# Patient Record
Sex: Female | Born: 1940
Health system: Southern US, Community
[De-identification: ages and names within clinical notes are randomized; demographics above are authoritative.]

## PROBLEM LIST (undated history)

## (undated) DIAGNOSIS — J45909 Unspecified asthma, uncomplicated: Secondary | ICD-10-CM

## (undated) DIAGNOSIS — F419 Anxiety disorder, unspecified: Secondary | ICD-10-CM

## (undated) DIAGNOSIS — E7800 Pure hypercholesterolemia, unspecified: Secondary | ICD-10-CM

## (undated) DIAGNOSIS — K635 Polyp of colon: Secondary | ICD-10-CM

## (undated) DIAGNOSIS — D242 Benign neoplasm of left breast: Secondary | ICD-10-CM

## (undated) DIAGNOSIS — I078 Other rheumatic tricuspid valve diseases: Secondary | ICD-10-CM

## (undated) DIAGNOSIS — Z9221 Personal history of antineoplastic chemotherapy: Secondary | ICD-10-CM

## (undated) HISTORY — PX: OTHER SURGICAL HISTORY: SHX169

## (undated) HISTORY — DX: Benign neoplasm of left breast: D24.2

## (undated) HISTORY — PX: TONSILLECTOMY: SUR1361

## (undated) HISTORY — DX: Unspecified asthma, uncomplicated: J45.909

## (undated) HISTORY — DX: Pure hypercholesterolemia, unspecified: E78.00

## (undated) HISTORY — PX: FOOT SURGERY: SHX648

## (undated) HISTORY — PX: CYST REMOVAL TRUNK: SHX6283

## (undated) HISTORY — DX: Polyp of colon: K63.5

## (undated) HISTORY — PX: EYE SURGERY: SHX253

---

## 2003-09-13 ENCOUNTER — Emergency Department (HOSPITAL_COMMUNITY): Admission: EM | Admit: 2003-09-13 | Discharge: 2003-09-13 | Payer: Self-pay | Admitting: Emergency Medicine

## 2004-07-16 ENCOUNTER — Ambulatory Visit (HOSPITAL_COMMUNITY): Admission: RE | Admit: 2004-07-16 | Discharge: 2004-07-16 | Payer: Self-pay | Admitting: General Surgery

## 2004-07-16 ENCOUNTER — Ambulatory Visit (HOSPITAL_BASED_OUTPATIENT_CLINIC_OR_DEPARTMENT_OTHER): Admission: RE | Admit: 2004-07-16 | Discharge: 2004-07-16 | Payer: Self-pay | Admitting: General Surgery

## 2004-07-16 ENCOUNTER — Encounter (INDEPENDENT_AMBULATORY_CARE_PROVIDER_SITE_OTHER): Payer: Self-pay | Admitting: *Deleted

## 2006-02-25 ENCOUNTER — Encounter: Admission: RE | Admit: 2006-02-25 | Discharge: 2006-02-25 | Payer: Self-pay | Admitting: Family Medicine

## 2007-09-04 ENCOUNTER — Other Ambulatory Visit: Admission: RE | Admit: 2007-09-04 | Discharge: 2007-09-04 | Payer: Self-pay | Admitting: Obstetrics and Gynecology

## 2008-05-03 ENCOUNTER — Ambulatory Visit (HOSPITAL_BASED_OUTPATIENT_CLINIC_OR_DEPARTMENT_OTHER): Admission: RE | Admit: 2008-05-03 | Discharge: 2008-05-03 | Payer: Self-pay | Admitting: General Surgery

## 2008-05-03 ENCOUNTER — Encounter (INDEPENDENT_AMBULATORY_CARE_PROVIDER_SITE_OTHER): Payer: Self-pay | Admitting: General Surgery

## 2008-05-13 ENCOUNTER — Ambulatory Visit: Payer: Self-pay | Admitting: Oncology

## 2008-05-20 LAB — CBC WITH DIFFERENTIAL (CANCER CENTER ONLY)
BASO#: 0 10*3/uL (ref 0.0–0.2)
BASO%: 0.7 % (ref 0.0–2.0)
EOS%: 1.8 % (ref 0.0–7.0)
Eosinophils Absolute: 0.1 10*3/uL (ref 0.0–0.5)
HCT: 38 % (ref 34.8–46.6)
HGB: 12.7 g/dL (ref 11.6–15.9)
LYMPH#: 2.3 10*3/uL (ref 0.9–3.3)
LYMPH%: 39 % (ref 14.0–48.0)
MCH: 26.8 pg (ref 26.0–34.0)
MCHC: 33.4 g/dL (ref 32.0–36.0)
MCV: 80 fL — ABNORMAL LOW (ref 81–101)
MONO#: 0.4 10*3/uL (ref 0.1–0.9)
MONO%: 6.4 % (ref 0.0–13.0)
NEUT#: 3.1 10*3/uL (ref 1.5–6.5)
NEUT%: 52.1 % (ref 39.6–80.0)
Platelets: 231 10*3/uL (ref 145–400)
RBC: 4.74 10*6/uL (ref 3.70–5.32)
RDW: 12.3 % (ref 10.5–14.6)
WBC: 6 10*3/uL (ref 3.9–10.0)

## 2008-05-20 LAB — CMP (CANCER CENTER ONLY)
ALT(SGPT): 17 U/L (ref 10–47)
AST: 26 U/L (ref 11–38)
Albumin: 3.8 g/dL (ref 3.3–5.5)
Alkaline Phosphatase: 73 U/L (ref 26–84)
BUN, Bld: 15 mg/dL (ref 7–22)
CO2: 27 mEq/L (ref 18–33)
Calcium: 9.2 mg/dL (ref 8.0–10.3)
Chloride: 104 mEq/L (ref 98–108)
Creat: 0.9 mg/dl (ref 0.6–1.2)
Glucose, Bld: 101 mg/dL (ref 73–118)
Potassium: 4 mEq/L (ref 3.3–4.7)
Sodium: 142 mEq/L (ref 128–145)
Total Bilirubin: 0.6 mg/dl (ref 0.20–1.60)
Total Protein: 7.2 g/dL (ref 6.4–8.1)

## 2008-07-04 ENCOUNTER — Ambulatory Visit: Payer: Self-pay | Admitting: Oncology

## 2008-07-05 LAB — CMP (CANCER CENTER ONLY)
ALT(SGPT): 19 U/L (ref 10–47)
AST: 23 U/L (ref 11–38)
Albumin: 3.5 g/dL (ref 3.3–5.5)
Alkaline Phosphatase: 70 U/L (ref 26–84)
BUN, Bld: 16 mg/dL (ref 7–22)
CO2: 27 mEq/L (ref 18–33)
Calcium: 9.5 mg/dL (ref 8.0–10.3)
Chloride: 107 mEq/L (ref 98–108)
Creat: 0.8 mg/dl (ref 0.6–1.2)
Glucose, Bld: 123 mg/dL — ABNORMAL HIGH (ref 73–118)
Potassium: 4 mEq/L (ref 3.3–4.7)
Sodium: 143 mEq/L (ref 128–145)
Total Bilirubin: 0.5 mg/dl (ref 0.20–1.60)
Total Protein: 6.8 g/dL (ref 6.4–8.1)

## 2008-07-05 LAB — CBC WITH DIFFERENTIAL (CANCER CENTER ONLY)
BASO#: 0 10*3/uL (ref 0.0–0.2)
BASO%: 0.8 % (ref 0.0–2.0)
EOS%: 1.9 % (ref 0.0–7.0)
Eosinophils Absolute: 0.1 10*3/uL (ref 0.0–0.5)
HCT: 36.4 % (ref 34.8–46.6)
HGB: 12 g/dL (ref 11.6–15.9)
LYMPH#: 1.9 10*3/uL (ref 0.9–3.3)
LYMPH%: 38.6 % (ref 14.0–48.0)
MCH: 26.6 pg (ref 26.0–34.0)
MCHC: 33 g/dL (ref 32.0–36.0)
MCV: 80 fL — ABNORMAL LOW (ref 81–101)
MONO#: 0.4 10*3/uL (ref 0.1–0.9)
MONO%: 8.7 % (ref 0.0–13.0)
NEUT#: 2.5 10*3/uL (ref 1.5–6.5)
NEUT%: 50 % (ref 39.6–80.0)
Platelets: 195 10*3/uL (ref 145–400)
RBC: 4.53 10*6/uL (ref 3.70–5.32)
RDW: 12 % (ref 10.5–14.6)
WBC: 5 10*3/uL (ref 3.9–10.0)

## 2009-01-21 ENCOUNTER — Ambulatory Visit: Payer: Self-pay | Admitting: Oncology

## 2009-06-30 ENCOUNTER — Ambulatory Visit: Payer: Self-pay | Admitting: Oncology

## 2009-07-03 LAB — CBC WITH DIFFERENTIAL (CANCER CENTER ONLY)
BASO#: 0 10*3/uL (ref 0.0–0.2)
BASO%: 0.6 % (ref 0.0–2.0)
EOS%: 2.9 % (ref 0.0–7.0)
Eosinophils Absolute: 0.2 10*3/uL (ref 0.0–0.5)
HCT: 39.5 % (ref 34.8–46.6)
HGB: 12.6 g/dL (ref 11.6–15.9)
LYMPH#: 2.6 10*3/uL (ref 0.9–3.3)
LYMPH%: 46.3 % (ref 14.0–48.0)
MCH: 26.8 pg (ref 26.0–34.0)
MCHC: 32 g/dL (ref 32.0–36.0)
MCV: 84 fL (ref 81–101)
MONO#: 0.4 10*3/uL (ref 0.1–0.9)
MONO%: 7.4 % (ref 0.0–13.0)
NEUT#: 2.4 10*3/uL (ref 1.5–6.5)
NEUT%: 42.8 % (ref 39.6–80.0)
Platelets: 199 10*3/uL (ref 145–400)
RBC: 4.72 10*6/uL (ref 3.70–5.32)
RDW: 12.7 % (ref 10.5–14.6)
WBC: 5.6 10*3/uL (ref 3.9–10.0)

## 2009-07-03 LAB — CMP (CANCER CENTER ONLY)
ALT(SGPT): 42 U/L (ref 10–47)
AST: 37 U/L (ref 11–38)
Albumin: 3.5 g/dL (ref 3.3–5.5)
Alkaline Phosphatase: 92 U/L — ABNORMAL HIGH (ref 26–84)
BUN, Bld: 16 mg/dL (ref 7–22)
CO2: 31 mEq/L (ref 18–33)
Calcium: 9.5 mg/dL (ref 8.0–10.3)
Chloride: 104 mEq/L (ref 98–108)
Creat: 0.7 mg/dl (ref 0.6–1.2)
Glucose, Bld: 119 mg/dL — ABNORMAL HIGH (ref 73–118)
Potassium: 3.7 mEq/L (ref 3.3–4.7)
Sodium: 144 mEq/L (ref 128–145)
Total Bilirubin: 0.5 mg/dl (ref 0.20–1.60)
Total Protein: 7 g/dL (ref 6.4–8.1)

## 2009-07-03 LAB — TECHNOLOGIST REVIEW CHCC SATELLITE

## 2010-05-07 ENCOUNTER — Encounter: Admission: RE | Admit: 2010-05-07 | Discharge: 2010-05-07 | Payer: Self-pay | Admitting: Radiology

## 2010-08-28 ENCOUNTER — Other Ambulatory Visit: Payer: Self-pay | Admitting: Obstetrics and Gynecology

## 2010-08-28 ENCOUNTER — Other Ambulatory Visit (HOSPITAL_COMMUNITY)
Admission: RE | Admit: 2010-08-28 | Discharge: 2010-08-28 | Disposition: A | Discharge status: Home / Self Care | Payer: Medicare Other | Source: Ambulatory Visit | Attending: Obstetrics and Gynecology | Admitting: Obstetrics and Gynecology

## 2010-08-28 DIAGNOSIS — Z01419 Encounter for gynecological examination (general) (routine) without abnormal findings: Secondary | ICD-10-CM | POA: Insufficient documentation

## 2010-12-01 NOTE — Op Note (Signed)
NAMEARMONI, Claudia Ortiz              ACCOUNT NO.:  [id number]   MEDICAL RECORD NO.:  [id number]          PATIENT TYPE:  AMB   LOCATION:  DSC                          FACILITY:  [facility]   PHYSICIAN:  Juanetta Gosling, MDDATE OF BIRTH:  January 19, 1941   DATE OF PROCEDURE:  05/03/2008  DATE OF DISCHARGE:                               OPERATIVE REPORT   PREOPERATIVE DIAGNOSIS:  Right breast bloody nipple discharge.   POSTOPERATIVE DIAGNOSIS:  Right breast bloody nipple discharge.   PROCEDURE:  Right breast central duct excision.   SURGEON:  Juanetta Gosling, MD   ASSISTANT:  None.   ANESTHESIA:  General.   SPECIMENS:  Right breast mass sent to pathology.   ESTIMATED BLOOD LOSS:  Minimal.   COMPLICATIONS:  None.   DISPOSITION:  To PACU in stable condition.   HISTORY:  Claudia Ortiz is a 70 year old female with a prior left breast  ductal excision for an intraductal papilloma by Dr. Maple Hudson in 2005 and  bilateral breast biopsies, who has had clear discharge since the end of  2008 that had turned bloody recently.  She has a spontaneous discharge  she finds on her undergarments with no associated mass she can identify.  She underwent a mammogram, which was BI-RADS who underwent a right  breast ultrasound that showed 2 masses in the 9 o'clock position and  another in the 3 o'clock position.  On her exam in the right upper outer  quadrant, she did have some bloody nipple discharge.  I counseled her  for a central duct excision for a bloody nipple discharge.   PROCEDURE:  After informed consent was obtained, the patient was taken  to the operating room.  I was able to express some clear discharge from  this area, but no bloody discharge.  There was clear discharge coming  from numerous ducts on her nipple.  She was placed under general  anesthesia without complication with an LMA.  Her right breast was then  prepped and draped in the standard sterile surgical fashion with  chlorhexidine.  A surgical time-out was performed.  I made approximately  3 cm incision on the areolar border laterally on her right breast and  then I dissected down below the nipple.  There were 2 palpable masses in  the region where the duct that had previously been draining bloody  discharge were.  I removed this specimen in total.  She had several  other areas that felt abnormal and I proceeded with a central duct  excision with removal of all abnormal tissue.  She still had some a  little bit of clear discharge upon completion from another duct that I  also removed in the 3 o'clock position.  Hemostasis was observed.  I  closed the deep layers with a 3-0 Vicryl to help her breasts retained in  shape and then I closed the skin with a 4-0 Vicryl in the dermis and a 4-  0 Monocryl in a subcuticular fashion.  I then placed a Steri-  Strips and a sterile dressing over the wound.  I infiltrated this with  10  mL of 0.25% Marcaine plain at the completion of this.  She tolerated  this well.  She was extubated in the operating room and transferred to  the recovery room in stable condition.      Juanetta Gosling, MD  Electronically Signed     MCW/MEDQ  D:  05/03/2008  T:  05/04/2008  Job:  [id number]

## 2011-01-13 ENCOUNTER — Other Ambulatory Visit: Payer: Self-pay | Admitting: Oncology

## 2011-01-13 DIAGNOSIS — N6452 Nipple discharge: Secondary | ICD-10-CM

## 2011-04-19 LAB — DIFFERENTIAL
Basophils Absolute: 0.1
Basophils Relative: 1
Eosinophils Absolute: 0.1
Eosinophils Relative: 2
Lymphocytes Relative: 39
Lymphs Abs: 2.1
Monocytes Absolute: 0.6
Monocytes Relative: 11
Neutro Abs: 2.5
Neutrophils Relative %: 47

## 2011-04-19 LAB — COMPREHENSIVE METABOLIC PANEL
ALT: 21
AST: 27
Albumin: 3.7
Alkaline Phosphatase: 63
BUN: 12
CO2: 27
Calcium: 9.6
Chloride: 107
Creatinine, Ser: 0.75
GFR calc Af Amer: >60
GFR calc non Af Amer: >60
Glucose, Bld: 96
Potassium: 4
Sodium: 140
Total Bilirubin: 0.5
Total Protein: 6.8

## 2011-04-19 LAB — CBC
HCT: 39.6
Hemoglobin: 12.8
MCHC: 32.3
MCV: 84.5
Platelets: 220
RBC: 4.68
RDW: 14
WBC: 5.3

## 2011-05-18 ENCOUNTER — Other Ambulatory Visit: Payer: Self-pay | Admitting: Radiology

## 2011-06-14 ENCOUNTER — Encounter (INDEPENDENT_AMBULATORY_CARE_PROVIDER_SITE_OTHER): Payer: Self-pay | Admitting: General Surgery

## 2012-09-04 ENCOUNTER — Other Ambulatory Visit (HOSPITAL_COMMUNITY)
Admission: RE | Admit: 2012-09-04 | Discharge: 2012-09-04 | Disposition: A | Discharge status: Home / Self Care | Payer: Medicare Other | Source: Ambulatory Visit | Attending: Obstetrics and Gynecology | Admitting: Obstetrics and Gynecology

## 2012-09-04 ENCOUNTER — Other Ambulatory Visit: Payer: Self-pay | Admitting: Obstetrics and Gynecology

## 2012-09-04 DIAGNOSIS — Z1151 Encounter for screening for human papillomavirus (HPV): Secondary | ICD-10-CM | POA: Insufficient documentation

## 2012-09-04 DIAGNOSIS — Z124 Encounter for screening for malignant neoplasm of cervix: Secondary | ICD-10-CM | POA: Insufficient documentation

## 2014-01-03 ENCOUNTER — Other Ambulatory Visit: Payer: Self-pay | Admitting: Family Medicine

## 2014-01-03 ENCOUNTER — Ambulatory Visit
Admission: RE | Admit: 2014-01-03 | Discharge: 2014-01-03 | Disposition: A | Discharge status: Home / Self Care | Payer: Medicare Other | Source: Ambulatory Visit | Attending: Family Medicine | Admitting: Family Medicine

## 2014-01-03 DIAGNOSIS — R0600 Dyspnea, unspecified: Secondary | ICD-10-CM

## 2014-02-18 ENCOUNTER — Encounter: Payer: Self-pay | Admitting: Interventional Cardiology

## 2014-02-18 ENCOUNTER — Encounter: Payer: Self-pay | Admitting: General Surgery

## 2014-02-18 ENCOUNTER — Ambulatory Visit (INDEPENDENT_AMBULATORY_CARE_PROVIDER_SITE_OTHER): Payer: Medicare Other | Admitting: Interventional Cardiology

## 2014-02-18 VITALS — BP 161/91 | HR 80 | Ht 65.5 in | Wt 205.8 lb

## 2014-02-18 DIAGNOSIS — Z853 Personal history of malignant neoplasm of breast: Secondary | ICD-10-CM

## 2014-02-18 DIAGNOSIS — R0600 Dyspnea, unspecified: Secondary | ICD-10-CM

## 2014-02-18 DIAGNOSIS — R0989 Other specified symptoms and signs involving the circulatory and respiratory systems: Secondary | ICD-10-CM

## 2014-02-18 DIAGNOSIS — R9431 Abnormal electrocardiogram [ECG] [EKG]: Secondary | ICD-10-CM

## 2014-02-18 DIAGNOSIS — R0609 Other forms of dyspnea: Secondary | ICD-10-CM

## 2014-02-18 DIAGNOSIS — R0789 Other chest pain: Secondary | ICD-10-CM

## 2014-02-18 DIAGNOSIS — E785 Hyperlipidemia, unspecified: Secondary | ICD-10-CM

## 2014-02-18 DIAGNOSIS — R5383 Other fatigue: Secondary | ICD-10-CM | POA: Insufficient documentation

## 2014-02-18 NOTE — Patient Instructions (Signed)
Your physician recommends that you continue on your current medications as directed. Please refer to the Current Medication list given to you today.  Your physician has requested that you have an exercise tolerance test. For further information please visit [email or web address]. Please also follow instruction sheet, as given.  Your physician recommends that you schedule a follow-up appointment Pending results of Exercise Tolerance Test.

## 2014-02-18 NOTE — Progress Notes (Signed)
Patient ID: TUYET BADER, female   DOB: 1940-09-28, 73 y.o.   MRN: [id number]   Date: 02/18/2014 ID: KAYSEE HERGERT, DOB 1941/01/03, MRN [id number] PCP: Osborne Casco, MD  Reason: Chest discomfort  ASSESSMENT;  1.  Atypical chest discomfort beginning 6 weeks ago and now essentially resolved 2. Dyspnea on exertion 3. History of breast cancer 4. History of asthma 5. Hyperlipidemia    PLAN:  1. Exercise treadmill test   SUBJECTIVE: YASSMINE TAMM is a 73 y.o. female who is referred for evaluation because of a several week history of cough, chest tightness, and dyspnea. Since she initially complained, her symptoms have improved. There is no orthopnea or PND. She denies palpitations. No lower extremity swelling has been noted. The patient has a significant family history of coronary disease. Both her mother and father died of vascular disease.   Allergies  Allergen Reactions  . Cephalosporins   . Iodine   . Penicillins     No current outpatient prescriptions on file prior to visit.   No current facility-administered medications on file prior to visit.    Past Medical History  Diagnosis Date  . Colon polyp   . Asthma     As Child  . Intraductal papilloma of left breast      precancerous cells in R breast  . Elevated cholesterol     Past Surgical History  Procedure Laterality Date  . Cyst removal trunk      breast   . Foot surgery      History   Social History  . Marital Status: Married    Spouse Name: N/A    Number of Children: N/A  . Years of Education: N/A   Occupational History  . Not on file.   Social History Main Topics  . Smoking status: Never Smoker   . Smokeless tobacco: Not on file  . Alcohol Use: No  . Drug Use: No  . Sexual Activity: Not on file   Other Topics Concern  . Not on file   Social History Narrative  . No narrative on file    Family History  Problem Relation Age of Onset  . Diabetes Mother   . Heart attack  Mother   . Stroke Father   . Heart disease Father   . Heart attack Brother     ROS: Denies orthopnea, PND, claudication, palpitations, syncope, stroke, previous myocardial infarction, diabetes, and hypertension to. Other systems negative for complaints.  OBJECTIVE: BP 161/91  Pulse 80  Ht 5' 5.5" (1.664 m)  Wt 205 lb 12.8 oz (93.35 kg)  BMI 33.71 kg/m2,  General: No acute distress, mildly obese HEENT: normal no jaundice or pallor Neck: JVD flat. Carotids absent bruits and normal upstroke Chest: Clear Cardiac: Murmur: Absent. Gallop: Normal. Rhythm: Normal. Other: None Abdomen: Bruit: Absent. Pulsation: Absent Extremities: Edema: None. Pulses: 2+ and symmetric Neuro: Normal Psych: Normal  ECG: Poor R-wave progression V1 through V4 with leftward axis

## 2014-02-22 ENCOUNTER — Ambulatory Visit (HOSPITAL_COMMUNITY)
Admission: RE | Admit: 2014-02-22 | Discharge: 2014-02-22 | Disposition: A | Discharge status: Home / Self Care | Payer: Medicare Other | Source: Ambulatory Visit | Attending: Interventional Cardiology | Admitting: Interventional Cardiology

## 2014-02-22 DIAGNOSIS — R5381 Other malaise: Secondary | ICD-10-CM | POA: Diagnosis not present

## 2014-02-22 DIAGNOSIS — R0609 Other forms of dyspnea: Secondary | ICD-10-CM | POA: Insufficient documentation

## 2014-02-22 DIAGNOSIS — R5383 Other fatigue: Secondary | ICD-10-CM

## 2014-02-22 DIAGNOSIS — R0989 Other specified symptoms and signs involving the circulatory and respiratory systems: Secondary | ICD-10-CM | POA: Insufficient documentation

## 2014-02-22 DIAGNOSIS — R0789 Other chest pain: Secondary | ICD-10-CM | POA: Diagnosis not present

## 2014-02-22 DIAGNOSIS — R9431 Abnormal electrocardiogram [ECG] [EKG]: Secondary | ICD-10-CM | POA: Insufficient documentation

## 2015-09-30 ENCOUNTER — Other Ambulatory Visit: Payer: Self-pay | Admitting: Family Medicine

## 2015-09-30 ENCOUNTER — Ambulatory Visit
Admission: RE | Admit: 2015-09-30 | Discharge: 2015-09-30 | Disposition: A | Discharge status: Home / Self Care | Payer: Medicare Other | Source: Ambulatory Visit | Attending: Family Medicine | Admitting: Family Medicine

## 2015-09-30 DIAGNOSIS — R05 Cough: Secondary | ICD-10-CM

## 2015-09-30 DIAGNOSIS — R059 Cough, unspecified: Secondary | ICD-10-CM

## 2017-07-19 DIAGNOSIS — M1990 Unspecified osteoarthritis, unspecified site: Secondary | ICD-10-CM

## 2017-07-19 HISTORY — DX: Unspecified osteoarthritis, unspecified site: M19.90

## 2019-01-12 ENCOUNTER — Other Ambulatory Visit: Payer: Self-pay | Admitting: Family Medicine

## 2019-01-12 ENCOUNTER — Ambulatory Visit
Admission: RE | Admit: 2019-01-12 | Discharge: 2019-01-12 | Disposition: A | Discharge status: Home / Self Care | Payer: Medicare Other | Source: Ambulatory Visit | Attending: Family Medicine | Admitting: Family Medicine

## 2019-01-12 DIAGNOSIS — R52 Pain, unspecified: Secondary | ICD-10-CM

## 2019-09-13 ENCOUNTER — Ambulatory Visit: Payer: Medicare Other | Attending: Internal Medicine

## 2019-09-13 DIAGNOSIS — Z23 Encounter for immunization: Secondary | ICD-10-CM

## 2019-09-13 NOTE — Progress Notes (Signed)
   Covid-19 Vaccination Clinic  Name:  Claudia Ortiz    MRN: [id number] DOB: 04-20-1941  09/13/2019  [name] was observed post Covid-19 immunization for 15 minutes without incidence. She was provided with Vaccine Information Sheet and instruction to access the V-Safe system.   [name] was instructed to call 911 with any severe reactions post vaccine: Marland Kitchen Difficulty breathing  . Swelling of your face and throat  . A fast heartbeat  . A bad rash all over your body  . Dizziness and weakness    Immunizations Administered    Name Date Dose VIS Date Route   Pfizer COVID-19 Vaccine 09/13/2019 10:02 AM 0.3 mL 06/29/2019 Intramuscular   Manufacturer: [place]   Lot: [id number]   [place]: [id number]

## 2019-10-10 ENCOUNTER — Ambulatory Visit: Payer: Medicare Other | Attending: Internal Medicine

## 2019-10-10 DIAGNOSIS — Z23 Encounter for immunization: Secondary | ICD-10-CM

## 2019-10-10 NOTE — Progress Notes (Signed)
   Covid-19 Vaccination Clinic  Name:  Claudia Ortiz    MRN: [id number] DOB: 02/02/1941  10/10/2019  [name] was observed post Covid-19 immunization for 15 minutes without incident. She was provided with Vaccine Information Sheet and instruction to access the V-Safe system.   [name] was instructed to call 911 with any severe reactions post vaccine: Marland Kitchen Difficulty breathing  . Swelling of face and throat  . A fast heartbeat  . A bad rash all over body  . Dizziness and weakness   Immunizations Administered    Name Date Dose VIS Date Route   Pfizer COVID-19 Vaccine 10/10/2019 10:58 AM 0.3 mL 06/29/2019 Intramuscular   Manufacturer: [place]   Lot: [id number]   [place]: [id number]

## 2020-05-19 DIAGNOSIS — C801 Malignant (primary) neoplasm, unspecified: Secondary | ICD-10-CM

## 2020-05-19 HISTORY — DX: Malignant (primary) neoplasm, unspecified: C80.1

## 2020-07-08 ENCOUNTER — Other Ambulatory Visit: Payer: Self-pay | Admitting: Radiology

## 2020-07-09 ENCOUNTER — Encounter: Payer: Self-pay | Admitting: *Deleted

## 2020-07-10 ENCOUNTER — Encounter: Payer: Self-pay | Admitting: Family Medicine

## 2020-07-14 ENCOUNTER — Other Ambulatory Visit: Payer: Self-pay | Admitting: *Deleted

## 2020-07-14 DIAGNOSIS — C50511 Malignant neoplasm of lower-outer quadrant of right female breast: Secondary | ICD-10-CM

## 2020-07-14 DIAGNOSIS — Z170 Estrogen receptor positive status [ER+]: Secondary | ICD-10-CM

## 2020-07-23 ENCOUNTER — Ambulatory Visit
Admission: RE | Admit: 2020-07-23 | Discharge: 2020-07-23 | Disposition: A | Discharge status: Home / Self Care | Payer: Medicare Other | Source: Ambulatory Visit | Attending: Radiation Oncology | Admitting: Radiation Oncology

## 2020-07-23 ENCOUNTER — Other Ambulatory Visit: Payer: Self-pay | Admitting: *Deleted

## 2020-07-23 ENCOUNTER — Inpatient Hospital Stay (HOSPITAL_BASED_OUTPATIENT_CLINIC_OR_DEPARTMENT_OTHER): Payer: Medicare Other | Admitting: Hematology and Oncology

## 2020-07-23 ENCOUNTER — Encounter: Payer: Self-pay | Admitting: Physical Therapy

## 2020-07-23 ENCOUNTER — Encounter: Payer: Self-pay | Admitting: *Deleted

## 2020-07-23 ENCOUNTER — Other Ambulatory Visit: Payer: Self-pay

## 2020-07-23 ENCOUNTER — Inpatient Hospital Stay: Payer: Medicare Other | Attending: Hematology and Oncology

## 2020-07-23 ENCOUNTER — Ambulatory Visit: Payer: Medicare Other | Attending: General Surgery | Admitting: Physical Therapy

## 2020-07-23 DIAGNOSIS — Z833 Family history of diabetes mellitus: Secondary | ICD-10-CM | POA: Diagnosis not present

## 2020-07-23 DIAGNOSIS — Z170 Estrogen receptor positive status [ER+]: Secondary | ICD-10-CM

## 2020-07-23 DIAGNOSIS — C50511 Malignant neoplasm of lower-outer quadrant of right female breast: Secondary | ICD-10-CM | POA: Insufficient documentation

## 2020-07-23 DIAGNOSIS — Z8249 Family history of ischemic heart disease and other diseases of the circulatory system: Secondary | ICD-10-CM | POA: Diagnosis not present

## 2020-07-23 DIAGNOSIS — Z823 Family history of stroke: Secondary | ICD-10-CM | POA: Diagnosis not present

## 2020-07-23 DIAGNOSIS — R293 Abnormal posture: Secondary | ICD-10-CM | POA: Diagnosis not present

## 2020-07-23 LAB — CMP (CANCER CENTER ONLY)
ALT: 18 U/L (ref 0–44)
AST: 20 U/L (ref 15–41)
Albumin: 3.7 g/dL (ref 3.5–5.0)
Alkaline Phosphatase: 82 U/L (ref 38–126)
Anion gap: 8 (ref 5–15)
BUN: 13 mg/dL (ref 8–23)
CO2: 25 mmol/L (ref 22–32)
Calcium: 9.7 mg/dL (ref 8.9–10.3)
Chloride: 107 mmol/L (ref 98–111)
Creatinine: 0.89 mg/dL (ref 0.44–1.00)
GFR, Estimated: >60 mL/min (ref >60)
Glucose, Bld: 113 mg/dL — ABNORMAL HIGH (ref 70–99)
Potassium: 4 mmol/L (ref 3.5–5.1)
Sodium: 140 mmol/L (ref 135–145)
Total Bilirubin: 0.6 mg/dL (ref 0.3–1.2)
Total Protein: 7.2 g/dL (ref 6.5–8.1)

## 2020-07-23 LAB — CBC WITH DIFFERENTIAL (CANCER CENTER ONLY)
Abs Immature Granulocytes: 0.02 10*3/uL (ref 0.00–0.07)
Basophils Absolute: 0.1 10*3/uL (ref 0.0–0.1)
Basophils Relative: 1 %
Eosinophils Absolute: 0.1 10*3/uL (ref 0.0–0.5)
Eosinophils Relative: 1 %
HCT: 41.4 % (ref 36.0–46.0)
Hemoglobin: 13.2 g/dL (ref 12.0–15.0)
Immature Granulocytes: 0 %
Lymphocytes Relative: 41 %
Lymphs Abs: 2.7 10*3/uL (ref 0.7–4.0)
MCH: 26.7 pg (ref 26.0–34.0)
MCHC: 31.9 g/dL (ref 30.0–36.0)
MCV: 83.8 fL (ref 80.0–100.0)
Monocytes Absolute: 0.5 10*3/uL (ref 0.1–1.0)
Monocytes Relative: 8 %
Neutro Abs: 3.2 10*3/uL (ref 1.7–7.7)
Neutrophils Relative %: 49 %
Platelet Count: 214 10*3/uL (ref 150–400)
RBC: 4.94 MIL/uL (ref 3.87–5.11)
RDW: 14 % (ref 11.5–15.5)
WBC Count: 6.6 10*3/uL (ref 4.0–10.5)
nRBC: 0 % (ref 0.0–0.2)

## 2020-07-23 LAB — GENETIC SCREENING ORDER

## 2020-07-23 NOTE — Assessment & Plan Note (Signed)
07/14/2020:Screening mammogram showed a 1.5cm right breast mass. US showed the 1.4cm mass at the 7 o'clock position, a 2.7cm mass at the 9 o'clock position, and benign right axillary lymph nodes. Biopsy showed ALH at the 9 o'clock position, and at the 7 o'clock position, IDC, grade 3, HER-2 positive (3+), ER+ 20% weak, PR- 0%, Ki67 40%.  Pathology and radiology counseling: Discussed with the patient, the details of pathology including the type of breast cancer,the clinical staging, the significance of ER, PR and HER-2/neu receptors and the implications for treatment. After reviewing the pathology in detail, we proceeded to discuss the different treatment options between surgery, radiation, chemotherapy, antiestrogen therapies.  Recommendation based on multidisciplinary tumor board: 1. Neoadjuvant chemotherapy with Taxol Herceptin followed by Herceptin maintenance for 1 year 2. Followed by breast conserving surgery if possible with sentinel lymph node study 3. Followed by adjuvant radiation therapy if patient had lumpectomy 4.  Followed by adjuvant antiestrogen therapy  Chemotherapy Counseling: I discussed the risks and benefits of chemotherapy including the risks of nausea/ vomiting, risk of infection from low WBC count, fatigue due to chemo or anemia, bruising or bleeding due to low platelets, mouth sores, loss/ change in taste and decreased appetite. Liver and kidney function will be monitored through out chemotherapy as abnormalities in liver and kidney function may be a side effect of treatment. Cardiac dysfunction due to Herceptin was discussed in detail. Risk of permanent bone marrow dysfunction due to chemo were also discussed.  Plan: 1. Port placement 2. Echocardiogram 3. Chemotherapy class 4. Breast MRI  Return to clinic in 1-2 weeks to start chemotherapy.

## 2020-07-23 NOTE — Therapy (Signed)
[place], [place], [address] Phone: [phone]   Fax:  [phone]  Physical Therapy Evaluation  Patient Details  Name: Claudia Ortiz MRN: [id number] Date of Birth: 08-26-1940 Referring Provider (PT): Dr. Rolm Bookbinder   Encounter Date: 07/23/2020   PT End of Session - 07/23/20 1548    Visit Number 1    Number of Visits 2    Date for PT Re-Evaluation 09/17/20    PT Start Time 7915    PT Stop Time 1454    PT Time Calculation (min) 42 min    Activity Tolerance Patient tolerated treatment well    Behavior During Therapy [facility] for tasks assessed/performed           Past Medical History:  Diagnosis Date  . Asthma    As Child  . Colon polyp   . Elevated cholesterol   . Intraductal papilloma of left breast     precancerous cells in R breast    Past Surgical History:  Procedure Laterality Date  . CYST REMOVAL TRUNK     breast   . FOOT SURGERY      There were no vitals filed for this visit.    Subjective Assessment - 07/23/20 1549    Subjective Patient reports she is here today to be seen by her medical team for her newly diagnosed right breast cancer.    Pertinent History Patient was diagnosed on 06/17/2020 with right grade III invasive ductal carcinoma breast cancer. It measures 1.5 cm in the lower outer quadrant. It is ER positive, PR negative and HER2 positive with a Ki67 of 40%.    Patient Stated Goals Reduce lymphedema risk and learn post op shoulder ROM HEP    Currently in Pain? Yes    Pain Score 3     Pain Location Arm    Pain Orientation Left;Upper    Pain Descriptors / Indicators Aching    Pain Type Chronic pain    Pain Onset 1 to 4 weeks ago    Pain Frequency Intermittent    Aggravating Factors  Pain began after Shingles shot 03/2020    Pain Relieving Factors Unknown              [facility] PT Assessment - 07/23/20 0001      Assessment   Medical Diagnosis Right breast  cancer    Referring Provider (PT) Dr. Rolm Bookbinder    Onset Date/Surgical Date 06/17/20    Hand Dominance Right    Prior Therapy none      Precautions   Precautions Other (comment)    Precaution Comments active cancer      Restrictions   Weight Bearing Restrictions No      Balance Screen   Has the patient fallen in the past 6 months No    Has the patient had a decrease in activity level because of a fear of falling?  No    Is the patient reluctant to leave their home because of a fear of falling?  No      Home Environment   Living Environment Private residence    Living Arrangements Spouse/significant other    Available Help at Discharge Family      Prior Function   Level of [place] Retired    Leisure She walk 2x/week for 25 minutes      Cognition   Overall Cognitive Status Within Functional Limits for tasks assessed  Posture/Postural Control   Posture/Postural Control Postural limitations    Postural Limitations Rounded Shoulders;Forward head      ROM / Strength   AROM / PROM / Strength AROM;Strength      AROM   Overall AROM Comments Cervical AROM is WNL    AROM Assessment Site Shoulder    Right/Left Shoulder Right;Left    Right Shoulder Extension 50 Degrees    Right Shoulder Flexion 151 Degrees    Right Shoulder ABduction 168 Degrees    Right Shoulder Internal Rotation 80 Degrees    Right Shoulder External Rotation 80 Degrees    Left Shoulder Extension 50 Degrees    Left Shoulder Flexion 141 Degrees    Left Shoulder ABduction 147 Degrees    Left Shoulder Internal Rotation 73 Degrees    Left Shoulder External Rotation 68 Degrees      Strength   Overall Strength Within functional limits for tasks performed             LYMPHEDEMA/ONCOLOGY QUESTIONNAIRE - 07/23/20 0001      Type   Cancer Type Right breast cancer      Lymphedema Assessments   Lymphedema Assessments Upper extremities      Right Upper Extremity  Lymphedema   10 cm Proximal to Olecranon Process 30.6 cm    Olecranon Process 24.7 cm    10 cm Proximal to Ulnar Styloid Process 22 cm    Just Proximal to Ulnar Styloid Process 14.9 cm    Across Hand at [employer] 19.4 cm    At [place] of 2nd Digit 6.6 cm      Left Upper Extremity Lymphedema   10 cm Proximal to Olecranon Process 31.2 cm    Olecranon Process 25.7 cm    10 cm Proximal to Ulnar Styloid Process 21.6 cm    Just Proximal to Ulnar Styloid Process 15.5 cm    Across Hand at [employer] 19.7 cm    At [place] of 2nd Digit 6.6 cm           L-DEX FLOWSHEETS - 07/23/20 1500      L-DEX LYMPHEDEMA SCREENING   Measurement Type Unilateral    L-DEX MEASUREMENT EXTREMITY Upper Extremity    POSITION  Standing    DOMINANT SIDE Right    At Risk Side Right    BASELINE SCORE (UNILATERAL) 1.8           The patient was assessed using the L-Dex machine today to produce a lymphedema index baseline score. The patient will be reassessed on a regular basis (typically every 3 months) to obtain new L-Dex scores. If the score is > 6.5 points away from his/her baseline score indicating onset of subclinical lymphedema, it will be recommended to wear a compression garment for 4 weeks, 12 hours per day and then be reassessed. If the score continues to be > 6.5 points from baseline at reassessment, we will initiate lymphedema treatment. Assessing in this manner has a 95% rate of preventing clinically significant lymphedema.      Katina Dung - 07/23/20 0001    Open a tight or new jar No difficulty    Do heavy household chores (wash walls, wash floors) No difficulty    Carry a shopping bag or briefcase No difficulty    Wash your back No difficulty    Use a knife to cut food No difficulty    Recreational activities in which you take some force or impact through your arm, shoulder, or  hand (golf, hammering, tennis) No difficulty    During the past week, to what extent has your arm, shoulder or  hand problem interfered with your normal social activities with family, friends, neighbors, or groups? Not at all    During the past week, to what extent has your arm, shoulder or hand problem limited your work or other regular daily activities Not at all    Arm, shoulder, or hand pain. None    Tingling (pins and needles) in your arm, shoulder, or hand None    Difficulty Sleeping No difficulty    DASH Score 0 %            Objective measurements completed on examination: See above findings.         Patient was instructed today in a home exercise program today for post op shoulder range of motion. These included active assist shoulder flexion in sitting, scapular retraction, wall walking with shoulder abduction, and hands behind head external rotation.  She was encouraged to do these twice a day, holding 3 seconds and repeating 5 times when permitted by her physician.          PT Education - 07/23/20 1545    Education Details Lymphedema risk reduction and post op shoulder ROM HEP    Person(s) Educated Patient;Spouse    Methods Explanation;Demonstration;Handout    Comprehension Returned demonstration;Verbalized understanding               PT Long Term Goals - 07/23/20 1553      PT LONG TERM GOAL #1   Title Patient will demonstrate she has regained full shoulder ROM and function post operatively compared to baselines.    Time 8    Period Weeks    Status New    Target Date 09/17/20           Breast Clinic Goals - 07/23/20 1553      Patient will be able to verbalize understanding of pertinent lymphedema risk reduction practices relevant to her diagnosis specifically related to skin care.   Time 1    Period Days    Status Achieved      Patient will be able to return demonstrate and/or verbalize understanding of the post-op home exercise program related to regaining shoulder range of motion.   Time 1    Period Days    Status Achieved      Patient will be able  to verbalize understanding of the importance of attending the postoperative After Breast Cancer Class for further lymphedema risk reduction education and therapeutic exercise.   Time 1    Period Days    Status Achieved                 Plan - 07/23/20 1550    Clinical Impression Statement Patient was diagnosed on 06/17/2020 with right grade III invasive ductal carcinoma breast cancer. It measures 1.5 cm in the lower outer quadrant. It is ER positive, PR negative and HER2 positive with a Ki67 of 40%. Her multidisciplinary medical team met prior to her assessments to determine a recommended treatment plan. She will benefit from a post op PT reassessment to determine needs and from L-Dex screens every 3 months for 2 years to detect subclinical lymphedema.    Stability/Clinical Decision Making Stable/Uncomplicated    Clinical Decision Making Low    Rehab Potential Excellent    PT Frequency --   Eval and 1 f/u visit   PT Treatment/Interventions ADLs/Self Care Home Management;Therapeutic exercise;Patient/family  education    PT Next Visit Plan Will reassess 3-4 weeks post op    PT Home Exercise Plan Post op shoulder ROM HEP    Consulted and Agree with Plan of Care Patient;Family member/caregiver    Family Member Consulted Husband           Patient will benefit from skilled therapeutic intervention in order to improve the following deficits and impairments:  Postural dysfunction,Decreased range of motion,Decreased knowledge of precautions,Impaired UE functional use,Pain  Visit Diagnosis: Malignant neoplasm of lower-outer quadrant of right breast of female, estrogen receptor positive ([place]) - Plan: PT plan of care cert/re-cert  Abnormal posture - Plan: PT plan of care cert/re-cert   Patient will follow up at outpatient cancer rehab 3-4 weeks following surgery.  If the patient requires physical therapy at that time, a specific plan will be dictated and sent to the referring physician for  approval. The patient was educated today on appropriate basic range of motion exercises to begin post operatively and the importance of attending the After Breast Cancer class following surgery.  Patient was educated today on lymphedema risk reduction practices as it pertains to recommendations that will benefit the patient immediately following surgery.  She verbalized good understanding.      Problem List Patient Active Problem List   Diagnosis Date Noted  . Malignant neoplasm of lower-outer quadrant of right breast of female, estrogen receptor positive ([place]) 07/14/2020  . Dyspnea 02/18/2014  . Fatigue 02/18/2014  . Abnormal ECG 02/18/2014  . History of left breast cancer 02/18/2014  . Hyperlipidemia 02/18/2014   Annia Friendly, PT 07/23/20 3:57 PM  [place], [place], [address] Phone: [phone]   Fax:  [phone]  Name: Claudia Ortiz MRN: [id number] Date of Birth: [birth year]

## 2020-07-23 NOTE — Patient Instructions (Signed)

## 2020-07-23 NOTE — Progress Notes (Signed)
Error

## 2020-07-23 NOTE — Progress Notes (Signed)
[facility] NOTE  Patient Care Team: Kelton Pillar, MD as PCP - General (Family Medicine) Rockwell Germany, RN as Nurse Navigator Tressie Ellis, Paulette Blanch, RN as Oncology Nurse Navigator Rolm Bookbinder, MD as Consulting Physician (General Surgery) Nicholas Lose, MD as Consulting Physician (Hematology and Oncology) Kyung Rudd, MD as Consulting Physician (Radiation Oncology)  CHIEF COMPLAINTS/PURPOSE OF CONSULTATION:  Newly diagnosed breast cancer  HISTORY OF PRESENTING ILLNESS:  Claudia Ortiz 80 y.o. female is here because of recent diagnosis of invasive ductal carcinoma of the right breast. Screening mammogram on 06/17/20 showed a 1.5cm right breast mass. [place] on 06/19/20 showed the 1.4cm mass at the 7 o'clock position, a 2.7cm mass at the 9 o'clock position in the right breast, and benign right axillary lymph nodes. Biopsy on 07/08/20 showed atypical lobular hyperplasia at the 9 o'clock position, and at the 7 o'clock position, invasive ductal carcinoma, grade 3, HER-2 positive (3+), ER+ 20% weak, PR- 0%, Ki67 40%. She presents to the clinic today for initial evaluation and discussion of treatment options.   I reviewed her records extensively and collaborated the history with the patient.  SUMMARY OF ONCOLOGIC HISTORY: Oncology History  Malignant neoplasm of lower-outer quadrant of right breast of female, estrogen receptor positive ([place])  07/14/2020 Initial Diagnosis   Screening mammogram showed a 1.5cm right breast mass. US showed the 1.4cm mass at the 7 o'clock position, a 2.7cm mass at the 9 o'clock position, and benign right axillary lymph nodes. Biopsy showed ALH at the 9 o'clock position, and at the 7 o'clock position, IDC, grade 3, HER-2 positive (3+), ER+ 20% weak, PR- 0%, Ki67 40%.      MEDICAL HISTORY:  Past Medical History:  Diagnosis Date  . Asthma    As Child  . Colon polyp   . Elevated cholesterol   . Intraductal papilloma of left breast      precancerous cells in R breast    SURGICAL HISTORY: Past Surgical History:  Procedure Laterality Date  . CYST REMOVAL TRUNK     breast   . FOOT SURGERY      SOCIAL HISTORY: Social History   Socioeconomic History  . Marital status: Married    Spouse name: Not on file  . Number of children: Not on file  . Years of education: Not on file  . Highest education level: Not on file  Occupational History  . Not on file  Tobacco Use  . Smoking status: Never Smoker  . Smokeless tobacco: Not on file  Substance and Sexual Activity  . Alcohol use: No  . Drug use: No  . Sexual activity: Not on file  Other Topics Concern  . Not on file  Social History Narrative  . Not on file   Social Determinants of Health   Financial Resource Strain: Not on file  Food Insecurity: Not on file  Transportation Needs: Not on file  Physical Activity: Not on file  Stress: Not on file  Social Connections: Not on file  Intimate Partner Violence: Not on file    FAMILY HISTORY: Family History  Problem Relation Age of Onset  . Diabetes Mother   . Heart attack Mother   . Stroke Father   . Heart disease Father   . Heart attack Brother     ALLERGIES:  is allergic to cephalosporins, iodine, and penicillins.  MEDICATIONS:  Current Outpatient Medications  Medication Sig Dispense Refill  . b complex vitamins capsule Take 1 capsule by mouth daily.    Marland Kitchen  Garlic 10 MG CAPS Take 1 capsule by mouth daily.    . Multiple Vitamin (MULTIVITAMIN) tablet Take 1 tablet by mouth daily.    . Vitamin A 7.5 MG (25000 UT) CAPS Take 1 capsule by mouth daily.    . vitamin D, CHOLECALCIFEROL, 400 UNITS tablet Take 400 Units by mouth daily.    . Zinc Oxide-Vitamin C (ZINC PLUS VITAMIN C PO) Take 1 capsule by mouth daily.     No current facility-administered medications for this visit.    REVIEW OF SYSTEMS:    All other systems were reviewed with the patient and are negative.  PHYSICAL EXAMINATION: ECOG  PERFORMANCE STATUS: 1 - Symptomatic but completely ambulatory  Vitals:   07/23/20 1325  BP: (!) 135/98  Pulse: 80  Resp: 18  Temp: 98.4 F (36.9 C)  SpO2: 98%   Filed Weights   07/23/20 1325  Weight: 197 lb 1.6 oz (89.4 kg)      LABORATORY DATA:  I have reviewed the data as listed Lab Results  Component Value Date   WBC 6.6 07/23/2020   HGB 13.2 07/23/2020   HCT 41.4 07/23/2020   MCV 83.8 07/23/2020   PLT 214 07/23/2020   Lab Results  Component Value Date   NA 140 07/23/2020   K 4.0 07/23/2020   CL 107 07/23/2020   CO2 25 07/23/2020    RADIOGRAPHIC STUDIES: I have personally reviewed the radiological reports and agreed with the findings in the report.  ASSESSMENT AND PLAN:  Malignant neoplasm of lower-outer quadrant of right breast of female, estrogen receptor positive ([place]) 07/14/2020:Screening mammogram showed a 1.5cm right breast mass. US showed the 1.4cm mass at the 7 o'clock position, a 2.7cm mass at the 9 o'clock position, and benign right axillary lymph nodes. Biopsy showed ALH at the 9 o'clock position, and at the 7 o'clock position, IDC, grade 3, HER-2 positive (3+), ER+ 20% weak, PR- 0%, Ki67 40%.  Pathology and radiology counseling: Discussed with the patient, the details of pathology including the type of breast cancer,the clinical staging, the significance of ER, PR and HER-2/neu receptors and the implications for treatment. After reviewing the pathology in detail, we proceeded to discuss the different treatment options between surgery, radiation, chemotherapy, antiestrogen therapies.  Recommendation based on multidisciplinary tumor board: 1. Neoadjuvant chemotherapy with Taxol Herceptin followed by Herceptin maintenance for 1 year 2. Followed by breast conserving surgery if possible with sentinel lymph node study 3. Followed by adjuvant radiation therapy if patient had lumpectomy 4.  Followed by adjuvant antiestrogen therapy  Chemotherapy Counseling: I  discussed the risks and benefits of chemotherapy including the risks of nausea/ vomiting, risk of infection from low WBC count, fatigue due to chemo or anemia, bruising or bleeding due to low platelets, mouth sores, loss/ change in taste and decreased appetite. Liver and kidney function will be monitored through out chemotherapy as abnormalities in liver and kidney function may be a side effect of treatment. Cardiac dysfunction due to Herceptin was discussed in detail. Risk of permanent bone marrow dysfunction due to chemo were also discussed.  Plan: 1. Port placement 2. Echocardiogram 3. Chemotherapy class 4. Breast MRI  Return to clinic in 1-2 weeks to start chemotherapy.    All questions were answered. The patient knows to call the clinic with any problems, questions or concerns.   Rulon Eisenmenger, MD, MPH 07/23/2020    I, Molly Dorshimer, am acting as scribe for Nicholas Lose, MD.  I have reviewed the above documentation  for accuracy and completeness, and I agree with the above.

## 2020-07-23 NOTE — Progress Notes (Signed)
Radiation Oncology         ([phone]) [phone] ________________________________  Name: Claudia Ortiz        MRN: [id number]  Date of Service: 07/23/2020 DOB: 1941/04/26  SF:KCLEXNT, Margaretha Sheffield, MD  Rolm Bookbinder, MD     REFERRING PHYSICIAN: Rolm Bookbinder, MD   DIAGNOSIS: The encounter diagnosis was Malignant neoplasm of lower-outer quadrant of right breast of female, estrogen receptor positive ([place]).   HISTORY OF PRESENT ILLNESS: Claudia Ortiz is a 80 y.o. female seen in the multidisciplinary breast clinic for a new diagnosis of right breast cancer. The patient was noted to have a screening detected mass with associated calcifications in the right breast.  Findings were noted in the lower outer quadrant and on further diagnostic imaging, a 1.6 cm mass was seen at 7:00, and associated 2.7 cm mass was also seen at 9:00, her axilla was negative for adenopathy.  She underwent biopsy on 07/08/2020 the 9 o'clock position mass was consistent with complex sclerosing lesion with atypical lobular hyperplasia.  The 7:00 biopsy revealed a grade 3 invasive ductal carcinoma that was ER positive PR negative, HER-2 amplified with a Ki-67 of 40%.  She is seen today to discuss treatment recommendations for her cancer.    PREVIOUS RADIATION THERAPY: No   PAST MEDICAL HISTORY:  Past Medical History:  Diagnosis Date  . Asthma    As Child  . Colon polyp   . Elevated cholesterol   . Intraductal papilloma of left breast     precancerous cells in R breast       PAST SURGICAL HISTORY: Past Surgical History:  Procedure Laterality Date  . CYST REMOVAL TRUNK     breast   . FOOT SURGERY       FAMILY HISTORY:  Family History  Problem Relation Age of Onset  . Diabetes Mother   . Heart attack Mother   . Stroke Father   . Heart disease Father   . Heart attack Brother      SOCIAL HISTORY:  reports that she has never smoked. She does not have any smokeless tobacco history on file. She reports  that she does not drink alcohol and does not use drugs.  The patient is married and resides in [place]. She and her husband are active in their [employer] and have an adult son who lives in [place].   ALLERGIES: Cephalosporins, Iodine, and Penicillins   MEDICATIONS:  Current Outpatient Medications  Medication Sig Dispense Refill  . aspirin 325 MG tablet Take 325 mg by mouth daily.    . clobetasol cream (TEMOVATE) 7.00 % Apply 1 application topically 2 (two) times daily as needed.     . Evening Primrose Oil 500 MG CAPS Take by mouth.    . Multiple Vitamin (MULTIVITAMIN) tablet Take 1 tablet by mouth daily.    . Omega-3 Fatty Acids (FISH OIL) 1000 MG CAPS Take 1,000 mg by mouth daily.    . pravastatin (PRAVACHOL) 20 MG tablet Take 20 mg by mouth daily.    . Probiotic Product (PROBIOTIC DAILY PO) Take by mouth daily.    . vitamin B-12 (CYANOCOBALAMIN) 1000 MCG tablet Take 1,000 mcg by mouth daily.    . vitamin D, CHOLECALCIFEROL, 400 UNITS tablet Take 400 Units by mouth daily.    . vitamin E 100 UNIT capsule Take 100 Units by mouth daily.     No current facility-administered medications for this encounter.     REVIEW OF SYSTEMS: On review of systems, the  patient reports that she is doing well overall. She denies any concerns at this time with her breast. No other complaints are noted.    PHYSICAL EXAM:  Wt Readings from Last 3 Encounters:  02/18/14 205 lb 12.8 oz (93.4 kg)   Temp Readings from Last 3 Encounters:  No data found for Temp   BP Readings from Last 3 Encounters:  02/18/14 (!) 161/91   Pulse Readings from Last 3 Encounters:  02/18/14 80    In general this is a well appearing African-American female in no acute distress. She's alert and oriented x4 and appropriate throughout the examination. Cardiopulmonary assessment is negative for acute distress and she exhibits normal effort. Bilateral breast exam is deferred.    ECOG = 0  0 - Asymptomatic (Fully  active, able to carry on all predisease activities without restriction)  1 - Symptomatic but completely ambulatory (Restricted in physically strenuous activity but ambulatory and able to carry out work of a light or sedentary nature. For example, light housework, office work)  2 - Symptomatic, <50% in bed during the day (Ambulatory and capable of all self care but unable to carry out any work activities. Up and about more than 50% of waking hours)  3 - Symptomatic, >50% in bed, but not bedbound (Capable of only limited self-care, confined to bed or chair 50% or more of waking hours)  4 - Bedbound (Completely disabled. Cannot carry on any self-care. Totally confined to bed or chair)  5 - Death   Eustace Pen MM, Creech RH, Tormey DC, et al. [phone]). "Toxicity and response criteria of the [facility]". [place] Oncol. 5 (6): 649-55    LABORATORY DATA:  Lab Results  Component Value Date   WBC 5.6 07/03/2009   HGB 12.6 07/03/2009   HCT 39.5 07/03/2009   MCV 84 07/03/2009   PLT 199 07/03/2009   Lab Results  Component Value Date   NA 144 07/03/2009   K 3.7 07/03/2009   CL 104 07/03/2009   CO2 31 07/03/2009   Lab Results  Component Value Date   ALT 42 07/03/2009   AST 37 07/03/2009   ALKPHOS 92 (H) 07/03/2009   BILITOT 0.50 07/03/2009      RADIOGRAPHY: No results found.     IMPRESSION/PLAN: 1. Stage IA, cT1cN0M0 grade 3, ER positive, HER2 amplified invasive ductal carcinoma of the right breast. Dr. Lisbeth Renshaw discusses the pathology findings and reviews the nature of HER-2 amplified breast disease. The consensus from the breast conference includes consideration of neoadjuvant chemotherapy with subsequent surgery to be determined.  Dr. Lisbeth Renshaw discusses the rationale for adjuvant external radiotherapy to the breast  to reduce risks of local recurrence if she undergoes breast conserving surgery followed by antiestrogen therapy. We discussed the risks, benefits, short,  and long term effects of radiotherapy, as well as the curative intent, and the patient is interested in proceeding. Dr. Lisbeth Renshaw discusses the delivery and logistics of radiotherapy and anticipates a course of 4 or 6 1/2 weeks of radiotherapy depending on her surgical decision making on postoperative findings. She is in agreement with this plan.  We will see her back a few weeks after surgery to discuss the simulation process and anticipate we starting radiotherapy about 4-6 weeks after surgery.     In a visit lasting 60 minutes, greater than 50% of the time was spent face to face reviewing her case, as well as in preparation of, discussing, and coordinating the patient's care.  The  above documentation reflects my direct findings during this shared patient visit. Please see the separate note by Dr. Lisbeth Renshaw on this date for the remainder of the patient's plan of care.    Carola Rhine, PAC

## 2020-07-24 ENCOUNTER — Other Ambulatory Visit: Payer: Self-pay | Admitting: General Surgery

## 2020-07-25 ENCOUNTER — Telehealth: Payer: Self-pay | Admitting: Hematology and Oncology

## 2020-07-25 ENCOUNTER — Other Ambulatory Visit: Payer: Self-pay | Admitting: Hematology and Oncology

## 2020-07-25 ENCOUNTER — Inpatient Hospital Stay: Payer: Medicare Other

## 2020-07-25 ENCOUNTER — Other Ambulatory Visit: Payer: Self-pay

## 2020-07-25 ENCOUNTER — Ambulatory Visit (HOSPITAL_COMMUNITY)
Admission: RE | Admit: 2020-07-25 | Discharge: 2020-07-25 | Disposition: A | Discharge status: Home / Self Care | Payer: Medicare Other | Source: Ambulatory Visit | Attending: Hematology and Oncology | Admitting: Hematology and Oncology

## 2020-07-25 DIAGNOSIS — Z79899 Other long term (current) drug therapy: Secondary | ICD-10-CM | POA: Diagnosis not present

## 2020-07-25 DIAGNOSIS — Z0189 Encounter for other specified special examinations: Secondary | ICD-10-CM | POA: Diagnosis not present

## 2020-07-25 DIAGNOSIS — C50511 Malignant neoplasm of lower-outer quadrant of right female breast: Secondary | ICD-10-CM | POA: Insufficient documentation

## 2020-07-25 DIAGNOSIS — Z170 Estrogen receptor positive status [ER+]: Secondary | ICD-10-CM

## 2020-07-25 DIAGNOSIS — Z5181 Encounter for therapeutic drug level monitoring: Secondary | ICD-10-CM | POA: Diagnosis not present

## 2020-07-25 DIAGNOSIS — E785 Hyperlipidemia, unspecified: Secondary | ICD-10-CM | POA: Diagnosis not present

## 2020-07-25 LAB — ECHOCARDIOGRAM COMPLETE
Area-P 1/2: 6.54 cm2
S' Lateral: 2.4 cm

## 2020-07-25 MED ORDER — PROCHLORPERAZINE MALEATE 10 MG PO TABS: 10.0000 mg | ORAL_TABLET | Freq: Four times a day (QID) | ORAL | 1 refills | Status: DC | PRN

## 2020-07-25 MED ORDER — LIDOCAINE-PRILOCAINE 2.5-2.5 % EX CREA: TOPICAL_CREAM | CUTANEOUS | 3 refills | Status: DC

## 2020-07-25 MED ORDER — ONDANSETRON HCL 8 MG PO TABS: 8.0000 mg | ORAL_TABLET | Freq: Two times a day (BID) | ORAL | 1 refills | Status: DC | PRN

## 2020-07-25 NOTE — Progress Notes (Signed)
START OFF PATHWAY REGIMEN - Breast   OFF00020:Paclitaxel + Trastuzumab:   A cycle is every 28 days:     Paclitaxel      Trastuzumab-xxxx      Trastuzumab-xxxx   **Always confirm dose/schedule in your pharmacy ordering system**  Patient Characteristics: Preoperative or Nonsurgical Candidate (Clinical Staging), Neoadjuvant Therapy followed by Surgery, Invasive Disease, Chemotherapy, HER2 Positive, ER Positive Therapeutic Status: Preoperative or Nonsurgical Candidate (Clinical Staging) AJCC M Category: cM0 AJCC Grade: G3 Breast Surgical Plan: Neoadjuvant Therapy followed by Surgery ER Status: Positive (+) AJCC 8 Stage Grouping: IA HER2 Status: Positive (+) AJCC T Category: cT1c AJCC N Category: cN0 PR Status: Negative (-) Intent of Therapy: Curative Intent, Discussed with Patient 

## 2020-07-25 NOTE — Progress Notes (Signed)
  Echocardiogram 2D Echocardiogram has been performed.  Claudia Ortiz 07/25/2020, 11:13 AM

## 2020-07-25 NOTE — Telephone Encounter (Signed)
No 1/5 los, no changes made to pt schedule

## 2020-07-28 ENCOUNTER — Ambulatory Visit (HOSPITAL_COMMUNITY): Payer: Medicare Other

## 2020-07-28 ENCOUNTER — Encounter: Payer: Self-pay | Admitting: Licensed Clinical Social Worker

## 2020-07-28 NOTE — Progress Notes (Signed)
[facility] Psychosocial Distress Screening Clinical Social Work  Clinical Social Work was referred by distress screening protocol.  The patient scored a 10 on the Psychosocial Distress Thermometer which indicates severe distress. Clinical Social Worker contacted patient by phone to assess for distress and other psychosocial needs.  Patient reports that she is continuing to process and it has been a little overwhelming with so much information. She is talking with her husband about her options and is waiting on the MRI tonight for more clarification. She feels she has the support she needs right now.  ON[facility] DISTRESS SCREENING 07/25/2020  Screening Type Initial Screening  Distress experienced in past week (1-10) 10  Emotional problem type Adjusting to illness;Adjusting to appearance changes  Referral to clinical social work Yes    Clinical Social Worker follow up needed: No. Patient may call as needed for additional coping support  If yes, follow up plan:  [name] E Langford Carias, LCSW

## 2020-07-31 ENCOUNTER — Ambulatory Visit (HOSPITAL_COMMUNITY)
Admission: RE | Admit: 2020-07-31 | Discharge: 2020-07-31 | Disposition: A | Discharge status: Home / Self Care | Payer: Medicare Other | Source: Ambulatory Visit | Attending: Hematology and Oncology | Admitting: Hematology and Oncology

## 2020-07-31 ENCOUNTER — Encounter: Payer: Self-pay | Admitting: *Deleted

## 2020-07-31 ENCOUNTER — Other Ambulatory Visit: Payer: Self-pay | Admitting: Hematology and Oncology

## 2020-07-31 ENCOUNTER — Telehealth: Payer: Self-pay | Admitting: *Deleted

## 2020-07-31 ENCOUNTER — Other Ambulatory Visit: Payer: Self-pay

## 2020-07-31 DIAGNOSIS — Z170 Estrogen receptor positive status [ER+]: Secondary | ICD-10-CM | POA: Diagnosis not present

## 2020-07-31 DIAGNOSIS — C50511 Malignant neoplasm of lower-outer quadrant of right female breast: Secondary | ICD-10-CM | POA: Insufficient documentation

## 2020-07-31 MED ORDER — GADOBUTROL 1 MMOL/ML IV SOLN
9.0000 mL | Freq: Once | INTRAVENOUS | Status: AC | PRN
  Administered 2020-07-31: 9 mL via INTRAVENOUS

## 2020-07-31 NOTE — Telephone Encounter (Signed)
Spoke with patient to follow up from [facility] 1/5 and assess navigation needs.  Patient states she has not decided on her treatment plan yet and is undecided on whether or not she wants chemo.  She inquired about her MRI which had resulted and I discussed the need for additional bx's from the recommendation from MRI. I told her we get these scheduled as as possible. She does not want to make any decisions regarding treatment until after bx's are complete. Team notified.

## 2020-08-01 ENCOUNTER — Telehealth: Payer: Self-pay | Admitting: *Deleted

## 2020-08-01 MED ORDER — ANASTROZOLE 1 MG PO TABS: 1.0000 mg | ORAL_TABLET | Freq: Every day | ORAL | 3 refills | Status: DC

## 2020-08-01 NOTE — Telephone Encounter (Signed)
Spoke with patient regarding taking anastrozole since there will be a delay in starting treatment as patient will need additional biopsies and she has yet to decide what she wants to do for treatment.  Discussed implications and the side effects of anastrozole and instructions on how to take it. Patient is agreeable. Rx has been sent to her pharmacy. Instructed her to call with any problems or concerns.  Patient verbalized understanding.

## 2020-08-06 ENCOUNTER — Ambulatory Visit (HOSPITAL_COMMUNITY): Payer: Medicare Other

## 2020-08-08 ENCOUNTER — Other Ambulatory Visit: Payer: Self-pay

## 2020-08-08 ENCOUNTER — Ambulatory Visit
Admission: RE | Admit: 2020-08-08 | Discharge: 2020-08-08 | Disposition: A | Discharge status: Home / Self Care | Payer: Medicare Other | Source: Ambulatory Visit | Attending: Hematology and Oncology | Admitting: Hematology and Oncology

## 2020-08-08 ENCOUNTER — Other Ambulatory Visit: Payer: Self-pay | Admitting: Diagnostic Radiology

## 2020-08-08 DIAGNOSIS — C50511 Malignant neoplasm of lower-outer quadrant of right female breast: Secondary | ICD-10-CM

## 2020-08-08 DIAGNOSIS — Z170 Estrogen receptor positive status [ER+]: Secondary | ICD-10-CM

## 2020-08-08 DIAGNOSIS — D0511 Intraductal carcinoma in situ of right breast: Secondary | ICD-10-CM | POA: Diagnosis not present

## 2020-08-08 DIAGNOSIS — R928 Other abnormal and inconclusive findings on diagnostic imaging of breast: Secondary | ICD-10-CM | POA: Diagnosis not present

## 2020-08-08 DIAGNOSIS — N6341 Unspecified lump in right breast, subareolar: Secondary | ICD-10-CM | POA: Diagnosis not present

## 2020-08-08 MED ORDER — GADOBUTROL 1 MMOL/ML IV SOLN
8.0000 mL | Freq: Once | INTRAVENOUS | Status: AC | PRN
  Administered 2020-08-08: 8 mL via INTRAVENOUS

## 2020-08-11 ENCOUNTER — Encounter: Payer: Self-pay | Admitting: *Deleted

## 2020-08-12 ENCOUNTER — Encounter: Payer: Self-pay | Admitting: *Deleted

## 2020-08-12 ENCOUNTER — Telehealth: Payer: Self-pay | Admitting: *Deleted

## 2020-08-12 NOTE — Telephone Encounter (Signed)
Spoke with patient to follow up to see if she has made a decision regarding tx.  Patient states she is leaning towards going ahead with the treatment plan that was given to her, but she still would not commit.  She states she will call me back when she decides.

## 2020-08-14 ENCOUNTER — Other Ambulatory Visit: Payer: Self-pay | Admitting: General Surgery

## 2020-08-14 ENCOUNTER — Telehealth: Payer: Self-pay | Admitting: *Deleted

## 2020-08-14 NOTE — Telephone Encounter (Signed)
Received call back from patient stating she is ready to move forward with the port placement and chemo.  Informed her I would try to get her chemo scheduled day after port placement. Team notified.

## 2020-08-15 ENCOUNTER — Encounter: Payer: Self-pay | Admitting: *Deleted

## 2020-08-21 ENCOUNTER — Telehealth: Payer: Self-pay | Admitting: Hematology and Oncology

## 2020-08-21 ENCOUNTER — Encounter: Payer: Self-pay | Admitting: *Deleted

## 2020-08-21 NOTE — Telephone Encounter (Signed)
Called pt per 1/28 sch msg - left message for patient with appt date and time

## 2020-08-25 ENCOUNTER — Other Ambulatory Visit: Payer: Self-pay

## 2020-08-25 ENCOUNTER — Encounter (HOSPITAL_BASED_OUTPATIENT_CLINIC_OR_DEPARTMENT_OTHER): Payer: Self-pay | Admitting: General Surgery

## 2020-08-26 NOTE — Progress Notes (Signed)

## 2020-08-27 NOTE — Progress Notes (Signed)
The following biosimilar Kanjinti (trastuzumab-anns) has been selected for use in this patient.  Kennith Center, Pharm.D., CPP 08/27/2020@2 :29 PM

## 2020-08-27 NOTE — Progress Notes (Signed)
Pharmacist Chemotherapy Monitoring - Initial Assessment    Anticipated start date: 09/03/20   Regimen:  . Are orders appropriate based on the patient's diagnosis, regimen, and cycle? Yes . Does the plan date match the patient's scheduled date? Yes . Is the sequencing of drugs appropriate? Yes . Are the premedications appropriate for the patient's regimen? Yes . Prior Authorization for treatment is: Pending o If applicable, is the correct biosimilar selected based on the patient's insurance? not applicable  Organ Function and Labs: Marland Kitchen Are dose adjustments needed based on the patient's renal function, hepatic function, or hematologic function? Yes . Are appropriate labs ordered prior to the start of patient's treatment? Yes . Other organ system assessment, if indicated: trastuzumab: Echo/ MUGA . The following baseline labs, if indicated, have been ordered: N/A  Dose Assessment: . Are the drug doses appropriate? Yes . Are the following correct: o Drug concentrations Yes o IV fluid compatible with drug Yes o Administration routes Yes o Timing of therapy Yes . If applicable, does the patient have documented access for treatment and/or plans for port-a-cath placement? not applicable . If applicable, have lifetime cumulative doses been properly documented and assessed? not applicable Lifetime Dose Tracking  No doses have been documented on this patient for the following tracked chemicals: Doxorubicin, Epirubicin, Idarubicin, Daunorubicin, Mitoxantrone, Bleomycin, Oxaliplatin, Carboplatin, Liposomal Doxorubicin  o   Toxicity Monitoring/Prevention: . The patient has the following take home antiemetics prescribed: Ondansetron and Prochlorperazine . The patient has the following take home medications prescribed: N/A . Medication allergies and previous infusion related reactions, if applicable, have been reviewed and addressed. Yes . The patient's current medication list has been assessed for  drug-drug interactions with their chemotherapy regimen. no significant drug-drug interactions were identified on review.  Order Review: . Are the treatment plan orders signed? Yes . Is the patient scheduled to see a provider prior to their treatment? Yes  I verify that I have reviewed each item in the above checklist and answered each question accordingly.  Amaria Mundorf K, [place], 08/27/2020  4:17 PM

## 2020-08-29 ENCOUNTER — Other Ambulatory Visit (HOSPITAL_COMMUNITY)
Admission: RE | Admit: 2020-08-29 | Discharge: 2020-08-29 | Disposition: A | Discharge status: Home / Self Care | Payer: Medicare Other | Source: Ambulatory Visit | Attending: General Surgery | Admitting: General Surgery

## 2020-08-29 DIAGNOSIS — Z01812 Encounter for preprocedural laboratory examination: Secondary | ICD-10-CM | POA: Insufficient documentation

## 2020-08-29 DIAGNOSIS — Z20822 Contact with and (suspected) exposure to covid-19: Secondary | ICD-10-CM | POA: Insufficient documentation

## 2020-08-29 LAB — SARS CORONAVIRUS 2 (TAT 6-24 HRS): SARS Coronavirus 2: NEGATIVE

## 2020-09-02 ENCOUNTER — Other Ambulatory Visit: Payer: Self-pay

## 2020-09-02 ENCOUNTER — Ambulatory Visit (HOSPITAL_BASED_OUTPATIENT_CLINIC_OR_DEPARTMENT_OTHER): Payer: Medicare Other | Admitting: Certified Registered"

## 2020-09-02 ENCOUNTER — Ambulatory Visit (HOSPITAL_COMMUNITY): Payer: Medicare Other

## 2020-09-02 ENCOUNTER — Encounter (HOSPITAL_BASED_OUTPATIENT_CLINIC_OR_DEPARTMENT_OTHER)
Admission: RE | Disposition: A | Discharge status: Home / Self Care | Payer: Self-pay | Source: Home / Self Care | Attending: General Surgery

## 2020-09-02 ENCOUNTER — Encounter (HOSPITAL_BASED_OUTPATIENT_CLINIC_OR_DEPARTMENT_OTHER): Payer: Self-pay | Admitting: General Surgery

## 2020-09-02 ENCOUNTER — Ambulatory Visit (HOSPITAL_BASED_OUTPATIENT_CLINIC_OR_DEPARTMENT_OTHER)
Admission: RE | Admit: 2020-09-02 | Discharge: 2020-09-02 | Disposition: A | Discharge status: Home / Self Care | Payer: Medicare Other | Attending: General Surgery | Admitting: General Surgery

## 2020-09-02 DIAGNOSIS — Z452 Encounter for adjustment and management of vascular access device: Secondary | ICD-10-CM | POA: Diagnosis not present

## 2020-09-02 DIAGNOSIS — C50511 Malignant neoplasm of lower-outer quadrant of right female breast: Secondary | ICD-10-CM | POA: Diagnosis not present

## 2020-09-02 DIAGNOSIS — Z95828 Presence of other vascular implants and grafts: Secondary | ICD-10-CM

## 2020-09-02 DIAGNOSIS — Z170 Estrogen receptor positive status [ER+]: Secondary | ICD-10-CM | POA: Diagnosis not present

## 2020-09-02 DIAGNOSIS — E785 Hyperlipidemia, unspecified: Secondary | ICD-10-CM | POA: Diagnosis not present

## 2020-09-02 HISTORY — DX: Anxiety disorder, unspecified: F41.9

## 2020-09-02 HISTORY — PX: PORTACATH PLACEMENT: SHX2246

## 2020-09-02 SURGERY — INSERTION, TUNNELED CENTRAL VENOUS DEVICE, WITH PORT
Anesthesia: General | Site: Chest | Laterality: Left

## 2020-09-02 MED ORDER — PROPOFOL 10 MG/ML IV BOLUS
INTRAVENOUS | Status: DC | PRN
  Administered 2020-09-02: 20 mg via INTRAVENOUS
  Administered 2020-09-02: 180 mg via INTRAVENOUS

## 2020-09-02 MED ORDER — CIPROFLOXACIN IN D5W 400 MG/200ML IV SOLN
400.0000 mg | INTRAVENOUS | Status: AC
  Administered 2020-09-02: 400 mg via INTRAVENOUS

## 2020-09-02 MED ORDER — DEXAMETHASONE SODIUM PHOSPHATE 4 MG/ML IJ SOLN
INTRAMUSCULAR | Status: DC | PRN
  Administered 2020-09-02: 5 mg via INTRAVENOUS

## 2020-09-02 MED ORDER — ONDANSETRON HCL 4 MG/2ML IJ SOLN: 4.0000 mg | Freq: Once | INTRAMUSCULAR | Status: DC | PRN

## 2020-09-02 MED ORDER — ONDANSETRON HCL 4 MG/2ML IJ SOLN
INTRAMUSCULAR | Status: AC
  Filled 2020-09-02: qty 2

## 2020-09-02 MED ORDER — HEPARIN (PORCINE) IN NACL 2-0.9 UNITS/ML
INTRAMUSCULAR | Status: AC | PRN
  Administered 2020-09-02: 1 via INTRAVENOUS

## 2020-09-02 MED ORDER — EPHEDRINE SULFATE 50 MG/ML IJ SOLN
INTRAMUSCULAR | Status: DC | PRN
  Administered 2020-09-02 (×2): 10 mg via INTRAVENOUS

## 2020-09-02 MED ORDER — CIPROFLOXACIN IN D5W 400 MG/200ML IV SOLN
INTRAVENOUS | Status: AC
  Filled 2020-09-02: qty 200

## 2020-09-02 MED ORDER — ACETAMINOPHEN 500 MG PO TABS
ORAL_TABLET | ORAL | Status: AC
  Filled 2020-09-02: qty 2

## 2020-09-02 MED ORDER — PHENYLEPHRINE 40 MCG/ML (10ML) SYRINGE FOR IV PUSH (FOR BLOOD PRESSURE SUPPORT)
PREFILLED_SYRINGE | INTRAVENOUS | Status: AC
  Filled 2020-09-02: qty 20

## 2020-09-02 MED ORDER — FENTANYL CITRATE (PF) 100 MCG/2ML IJ SOLN
INTRAMUSCULAR | Status: AC
  Filled 2020-09-02: qty 2

## 2020-09-02 MED ORDER — PROPOFOL 500 MG/50ML IV EMUL
INTRAVENOUS | Status: DC | PRN
  Administered 2020-09-02: 150 ug/kg/min via INTRAVENOUS

## 2020-09-02 MED ORDER — LIDOCAINE 2% (20 MG/ML) 5 ML SYRINGE
INTRAMUSCULAR | Status: AC
  Filled 2020-09-02: qty 5

## 2020-09-02 MED ORDER — HEPARIN SOD (PORK) LOCK FLUSH 100 UNIT/ML IV SOLN
INTRAVENOUS | Status: DC | PRN
  Administered 2020-09-02: 400 [IU] via INTRAVENOUS

## 2020-09-02 MED ORDER — ONDANSETRON HCL 4 MG/2ML IJ SOLN
INTRAMUSCULAR | Status: DC | PRN
  Administered 2020-09-02: 4 mg via INTRAVENOUS

## 2020-09-02 MED ORDER — PHENYLEPHRINE HCL (PRESSORS) 10 MG/ML IV SOLN
INTRAVENOUS | Status: DC | PRN
  Administered 2020-09-02 (×2): 80 ug via INTRAVENOUS

## 2020-09-02 MED ORDER — LACTATED RINGERS IV SOLN: INTRAVENOUS | Status: DC

## 2020-09-02 MED ORDER — LIDOCAINE 2% (20 MG/ML) 5 ML SYRINGE
INTRAMUSCULAR | Status: DC | PRN
  Administered 2020-09-02: 60 mg via INTRAVENOUS

## 2020-09-02 MED ORDER — FENTANYL CITRATE (PF) 100 MCG/2ML IJ SOLN
INTRAMUSCULAR | Status: DC | PRN
  Administered 2020-09-02: 50 ug via INTRAVENOUS

## 2020-09-02 MED ORDER — BUPIVACAINE HCL (PF) 0.25 % IJ SOLN
INTRAMUSCULAR | Status: DC | PRN
  Administered 2020-09-02: 8 mL

## 2020-09-02 MED ORDER — EPHEDRINE 5 MG/ML INJ
INTRAVENOUS | Status: AC
  Filled 2020-09-02: qty 10

## 2020-09-02 MED ORDER — TRAMADOL HCL 50 MG PO TABS
50.0000 mg | ORAL_TABLET | Freq: Four times a day (QID) | ORAL | 0 refills | Status: DC | PRN
Start: 2020-09-02 — End: 2020-09-10

## 2020-09-02 MED ORDER — ACETAMINOPHEN 500 MG PO TABS
1000.0000 mg | ORAL_TABLET | ORAL | Status: AC
  Administered 2020-09-02: 1000 mg via ORAL

## 2020-09-02 MED ORDER — DEXAMETHASONE SODIUM PHOSPHATE 10 MG/ML IJ SOLN
INTRAMUSCULAR | Status: AC
  Filled 2020-09-02: qty 1

## 2020-09-02 MED ORDER — GLYCOPYRROLATE PF 0.2 MG/ML IJ SOSY
PREFILLED_SYRINGE | INTRAMUSCULAR | Status: AC
  Filled 2020-09-02: qty 1

## 2020-09-02 MED ORDER — FENTANYL CITRATE (PF) 100 MCG/2ML IJ SOLN: 25.0000 ug | INTRAMUSCULAR | Status: DC | PRN

## 2020-09-02 SURGICAL SUPPLY — 52 items
ADH SKN CLS APL DERMABOND .7 (GAUZE/BANDAGES/DRESSINGS) ×1
APL PRP STRL LF DISP 70% ISPRP (MISCELLANEOUS) ×1
APL SKNCLS STERI-STRIP NONHPOA (GAUZE/BANDAGES/DRESSINGS) ×1
BAG DECANTER FOR FLEXI CONT (MISCELLANEOUS) ×3 IMPLANT
BENZOIN TINCTURE PRP APPL 2/3 (GAUZE/BANDAGES/DRESSINGS) ×3 IMPLANT
BLADE SURG 11 STRL SS (BLADE) ×3 IMPLANT
BLADE SURG 15 STRL LF DISP TIS (BLADE) ×1 IMPLANT
BLADE SURG 15 STRL SS (BLADE) ×3
CANISTER SUCT 1200ML W/VALVE (MISCELLANEOUS) IMPLANT
CHLORAPREP W/TINT 26 (MISCELLANEOUS) ×3 IMPLANT
CLOSURE WOUND 1/2 X4 (GAUZE/BANDAGES/DRESSINGS) ×1
COVER BACK TABLE 60X90IN (DRAPES) ×3 IMPLANT
COVER MAYO STAND STRL (DRAPES) ×3 IMPLANT
COVER PROBE 5X48 (MISCELLANEOUS) ×3
DECANTER SPIKE VIAL GLASS SM (MISCELLANEOUS) IMPLANT
DERMABOND ADVANCED (GAUZE/BANDAGES/DRESSINGS) ×2
DERMABOND ADVANCED .7 DNX12 (GAUZE/BANDAGES/DRESSINGS) ×1 IMPLANT
DRAPE C-ARM 42X72 X-RAY (DRAPES) ×3 IMPLANT
DRAPE LAPAROSCOPIC ABDOMINAL (DRAPES) ×3 IMPLANT
DRAPE UTILITY XL STRL (DRAPES) ×3 IMPLANT
DRSG TEGADERM 4X4.75 (GAUZE/BANDAGES/DRESSINGS) ×6 IMPLANT
ELECT COATED BLADE 2.86 ST (ELECTRODE) ×3 IMPLANT
ELECT REM PT RETURN 9FT ADLT (ELECTROSURGICAL) ×3
ELECTRODE REM PT RTRN 9FT ADLT (ELECTROSURGICAL) ×1 IMPLANT
GAUZE SPONGE 4X4 12PLY STRL LF (GAUZE/BANDAGES/DRESSINGS) ×3 IMPLANT
GLOVE SURG ENC MOIS LTX SZ6.5 (GLOVE) ×6 IMPLANT
GLOVE SURG ENC MOIS LTX SZ7 (GLOVE) ×3 IMPLANT
GLOVE SURG UNDER POLY LF SZ7.5 (GLOVE) ×3 IMPLANT
GOWN STRL REUS W/ TWL LRG LVL3 (GOWN DISPOSABLE) ×3 IMPLANT
GOWN STRL REUS W/TWL LRG LVL3 (GOWN DISPOSABLE) ×9
IV KIT MINILOC 20X1 SAFETY (NEEDLE) IMPLANT
KIT CVR 48X5XPRB PLUP LF (MISCELLANEOUS) ×1 IMPLANT
KIT PORT POWER 8FR ISP CVUE (Port) ×3 IMPLANT
NDL SAFETY ECLIPSE 18X1.5 (NEEDLE) IMPLANT
NEEDLE HYPO 18GX1.5 SHARP (NEEDLE)
NEEDLE HYPO 25X1 1.5 SAFETY (NEEDLE) ×3 IMPLANT
PACK BASIN DAY SURGERY FS (CUSTOM PROCEDURE TRAY) ×3 IMPLANT
PENCIL SMOKE EVACUATOR (MISCELLANEOUS) ×3 IMPLANT
SLEEVE SCD COMPRESS KNEE MED (MISCELLANEOUS) ×3 IMPLANT
STRIP CLOSURE SKIN 1/2X4 (GAUZE/BANDAGES/DRESSINGS) ×2 IMPLANT
SUT MNCRL AB 4-0 PS2 18 (SUTURE) ×3 IMPLANT
SUT PROLENE 2 0 SH DA (SUTURE) ×3 IMPLANT
SUT SILK 2 0 TIES 17X18 (SUTURE)
SUT SILK 2-0 18XBRD TIE BLK (SUTURE) IMPLANT
SUT VIC AB 3-0 SH 27 (SUTURE) ×3
SUT VIC AB 3-0 SH 27X BRD (SUTURE) ×1 IMPLANT
SYR 5ML LUER SLIP (SYRINGE) ×3 IMPLANT
SYR CONTROL 10ML LL (SYRINGE) ×3 IMPLANT
TOWEL GREEN STERILE FF (TOWEL DISPOSABLE) ×3 IMPLANT
TUBE CONNECTING 20'X1/4 (TUBING)
TUBE CONNECTING 20X1/4 (TUBING) IMPLANT
YANKAUER SUCT BULB TIP NO VENT (SUCTIONS) IMPLANT

## 2020-09-02 NOTE — Discharge Instructions (Signed)
PORT-A-CATH: POST OP INSTRUCTIONS  Always review your discharge instruction sheet given to you by the facility where your surgery was performed.   1. A prescription for pain medication may be given to you upon discharge. Take your pain medication as prescribed, if needed. If narcotic pain medicine is not needed, then you make take acetaminophen (Tylenol) or ibuprofen (Advil) as needed.  2. Take your usually prescribed medications unless otherwise directed. 3. If you need a refill on your pain medication, please contact our office. All narcotic pain medicine now requires a paper prescription.  Phoned in and fax refills are no longer allowed by law.  Prescriptions will not be filled after 5 pm or on weekends.  4. You should follow a light diet for the remainder of the day after your procedure. 5. Most patients will experience some mild swelling and/or bruising in the area of the incision. It may take several days to resolve. 6. It is common to experience some constipation if taking pain medication after surgery. Increasing fluid intake and taking a stool softener (such as Colace) will usually help or prevent this problem from occurring. A mild laxative (Milk of Magnesia or Miralax) should be taken according to package directions if there are no bowel movements after 48 hours.  7. Unless discharge instructions indicate otherwise, you may remove your bandages 48 hours after surgery, and you may shower at that time. You may have steri-strips (small white skin tapes) in place directly over the incision.  These strips should be left on the skin for 7-10 days.  If your surgeon used Dermabond (skin glue) on the incision, you may shower in 24 hours.  The glue will flake off over the next 2-3 weeks.  8. If your port is left accessed at the end of surgery (needle left in port), the dressing cannot get wet and should only by changed by a healthcare professional. When the port is no longer accessed (when the  needle has been removed), follow step 7.   9. ACTIVITIES:  Limit activity involving your arms for the next 72 hours. Do no strenuous exercise or activity for 1 week. You may drive when you are no longer taking prescription pain medication, you can comfortably wear a seatbelt, and you can maneuver your car. 10.You may need to see your doctor in the office for a follow-up appointment.  Please       check with your doctor.  11.When you receive a new Port-a-Cath, you will get a product guide and        ID card.  Please keep them in case you need them.  WHEN TO CALL YOUR DOCTOR [phone]): 1. Fever over 101.0 2. Chills 3. Continued bleeding from incision 4. Increased redness and tenderness at the site 5. Shortness of breath, difficulty breathing   The clinic staff is available to answer your questions during regular business hours. Please don't hesitate to call and ask to speak to one of the nurses or medical assistants for clinical concerns. If you have a medical emergency, go to the nearest emergency room or call 911.  A surgeon from [facility] is always on call at the hospital.     For further information, please visit [email or web address]     No Tylenol before 4:15pm today.  Post Anesthesia Home Care Instructions  Activity: Get plenty of rest for the remainder of the day. A responsible individual must stay with you for 24 hours following the procedure.  For the  next 24 hours, DO NOT: -Drive a car -Paediatric nurse -Drink alcoholic beverages -Take any medication unless instructed by your physician -Make any legal decisions or sign important papers.  Meals: Start with liquid foods such as gelatin or soup. Progress to regular foods as tolerated. Avoid greasy, spicy, heavy foods. If nausea and/or vomiting occur, drink only clear liquids until the nausea and/or vomiting subsides. Call your physician if vomiting continues.  Special  Instructions/Symptoms: Your throat may feel dry or sore from the anesthesia or the breathing tube placed in your throat during surgery. If this causes discomfort, gargle with warm salt water. The discomfort should disappear within 24 hours.  If you had a scopolamine patch placed behind your ear for the management of post- operative nausea and/or vomiting:  1. The medication in the patch is effective for 72 hours, after which it should be removed.  Wrap patch in a tissue and discard in the trash. Wash hands thoroughly with soap and water. 2. You may remove the patch earlier than 72 hours if you experience unpleasant side effects which may include dry mouth, dizziness or visual disturbances. 3. Avoid touching the patch. Wash your hands with soap and water after contact with the patch.

## 2020-09-02 NOTE — Anesthesia Preprocedure Evaluation (Addendum)
Anesthesia Evaluation  Patient identified by MRN, date of birth, ID band Patient awake    Reviewed: Allergy & Precautions, NPO status , Patient's Chart, lab work & pertinent test results  History of Anesthesia Complications Negative for: history of anesthetic complications  Airway Mallampati: II  TM Distance: >3 FB Neck ROM: Full    Dental  (+) Dental Advisory Given, Chipped,    Pulmonary shortness of breath, asthma ,    Pulmonary exam normal breath sounds clear to auscultation       Cardiovascular negative cardio ROS Normal cardiovascular exam Rhythm:Regular Rate:Normal     Neuro/Psych PSYCHIATRIC DISORDERS Anxiety negative neurological ROS     GI/Hepatic negative GI ROS, Neg liver ROS,   Endo/Other  Obesity   Renal/GU negative Renal ROS     Musculoskeletal negative musculoskeletal ROS (+)   Abdominal   Peds  Hematology negative hematology ROS (+)   Anesthesia Other Findings Day of surgery medications reviewed with the patient.  BREAST CANCER  Reproductive/Obstetrics                           Anesthesia Physical Anesthesia Plan  ASA: III  Anesthesia Plan: General   Post-op Pain Management:    Induction: Intravenous  PONV Risk Score and Plan: 3 and Dexamethasone, Ondansetron and Treatment may vary due to age or medical condition  Airway Management Planned: LMA  Additional Equipment:   Intra-op Plan:   Post-operative Plan: Extubation in OR  Informed Consent: I have reviewed the patients History and Physical, chart, labs and discussed the procedure including the risks, benefits and alternatives for the proposed anesthesia with the patient or authorized representative who has indicated his/her understanding and acceptance.     Dental advisory given  Plan Discussed with: CRNA  Anesthesia Plan Comments:         Anesthesia Quick Evaluation

## 2020-09-02 NOTE — Anesthesia Postprocedure Evaluation (Signed)
Anesthesia Post Note  Patient: Claudia Ortiz  Procedure(s) Performed: INSERTION PORT-A-CATH (Left Chest)     Patient location during evaluation: PACU Anesthesia Type: General Level of consciousness: awake and alert Pain management: pain level controlled Vital Signs Assessment: post-procedure vital signs reviewed and stable Respiratory status: spontaneous breathing, nonlabored ventilation and respiratory function stable Cardiovascular status: blood pressure returned to baseline and stable Postop Assessment: no apparent nausea or vomiting Anesthetic complications: no   No complications documented.  Last Vitals:  Vitals:   09/02/20 1200 09/02/20 1242  BP: 115/70 118/66  Pulse: 79 75  Resp: 17 18  Temp:  36.6 C  SpO2: 100% 99%    Last Pain:  Vitals:   09/02/20 1242  TempSrc:   PainSc: 3                  Catalina Gravel

## 2020-09-02 NOTE — Op Note (Signed)
Preoperative diagnosisher 2 positive  breast cancer Postoperative diagnosis: Same as above Procedure: Leftinternal jugular port placement with ultrasound guidance Surgeon: Dr. Serita Grammes Anesthesia: General  Estimated blood loss:minimal Specimens:none Sponge and needlecount was correct atcompletion Drains: None Disposition recovery stable condition  Indications:79 yof with her 2 positive breast cancer due to begin chemotherapy tomorrow. Discussed port placement.   Procedure: After informed consent was obtainedshe was taken to the OR.She was given antibiotics. SCDs were placed. She was placed under general anesthesia without complication. She was prepped and draped in the standard sterile surgical fashion. A surgical timeout was then performed.  I used the ultrasound to identify theleftinternal jugular vein. Under ultrasound guidance I then accessed the vein with the needle. I passed the wire. The wire was in the vein both by ultrasound and by fluoroscopy. I thenmade an incisionon herleft chest.I tunneled the line between the 2 sites. I then placed the dilator over the wire. I observed this with fluoroscopy to go in the correct position. I then removedthe wire. I then passed the line. The peel-away sheath was removed. I pulled the line back to be in the superior vena cava. I then attached the port. I sutured this into place with 2-0 Prolene. I then closed this with 3-0 Vicryl and 4-0 Monocryl. Glue was placed. Final fluoroscopic image showed the port to be in good position. I then accessed the port and was able to aspirate blood and packed this with heparin.

## 2020-09-02 NOTE — Assessment & Plan Note (Signed)
07/14/2020:Screening mammogram showed a 1.5cm right breast mass. US showed the 1.4cm mass at the 7 o'clock position, a 2.7cm mass at the 9 o'clock position, and benign right axillary lymph nodes. Biopsy showed ALH at the 9 o'clock position, and at the 7 o'clock position, IDC, grade 3, HER-2 positive (3+), ER+ 20% weak, PR- 0%, Ki67 40%.  Treatment Plan: 1. Neoadjuvant chemotherapy with Taxol Herceptin followed by Herceptin maintenance for 1 year 2. Followed by breast conserving surgery if possible with sentinel lymph node study 3. Followed by adjuvant radiation therapy if patient had lumpectomy 4.  Followed by adjuvant antiestrogen therapy ------------------------------------------------------------------------------------------------------------------------------- Current Treatment: Cycle 1 Taxol Herceptin ECHO 07/25/20: EF 55-60%  Chemo consent obtained. Chemo education completed RTC in 1 week for cycle 2 and Tox check  

## 2020-09-02 NOTE — Interval H&P Note (Signed)
History and Physical Interval Note:  09/02/2020 10:15 AM  Claudia Ortiz  has presented today for surgery, with the diagnosis of BREAST CANCER.  The various methods of treatment have been discussed with the patient and family. After consideration of risks, benefits and other options for treatment, the patient has consented to  Procedure(s): INSERTION PORT-A-CATH (N/A) as a surgical intervention.  The patient's history has been reviewed, patient examined, no change in status, stable for surgery.  I have reviewed the patient's chart and labs.  Questions were answered to the patient's satisfaction.     Rolm Bookbinder

## 2020-09-02 NOTE — H&P (Signed)
80 yof who is very healthy. no mass or dc. had a screening mm that shows a right lower outer quadrant mass with calcs associated with it. there is 1.5 cm mass in loq and then on [place] had a 1.6 cm mass at 9 oclock. while doing [place] another 2.7 cm irregular mass was noted at 9 oclock. axillary [place] is negative. biopsies of both were done. clips are 3-4 cm apart. the biopsy at 42 oclock is csl with alh. the biopsy of the mass is grade III IDC is er pos, pr neg, her 2 pos, Ki is 40%.   Past Surgical History  Breast Biopsy  Right. Foot Surgery  Right. Tonsillectomy   Diagnostic Studies History  Colonoscopy  5-10 years ago Mammogram  within last year Pap Smear  1-5 years ago  Social History Alcohol use  Occasional alcohol use. Caffeine use  Carbonated beverages, Coffee, Tea. No drug use  Tobacco use  Never smoker.  Family History Cerebrovascular Accident  Father. Heart Disease  Mother.  Pregnancy / Birth History Age at menarche  80 years. Age of menopause  32-50 Gravida  1 Irregular periods  Maternal age  81-25 Para  80  Other Problems  Asthma  Diverticulosis  Hypercholesterolemia   Review of Systems  General Not Present- Appetite Loss, Chills, Fatigue, Fever, Night Sweats, Weight Gain and Weight Loss. Skin Not Present- Change in Wart/Mole, Dryness, Hives, Jaundice, New Lesions, Non-Healing Wounds, Rash and Ulcer. HEENT Present- Seasonal Allergies and Wears glasses/contact lenses. Not Present- Earache, Hearing Loss, Hoarseness, Nose Bleed, Oral Ulcers, Ringing in the Ears, Sinus Pain, Sore Throat, Visual Disturbances and Yellow Eyes. Respiratory Present- Snoring. Not Present- Bloody sputum, Chronic Cough, Difficulty Breathing and Wheezing. Breast Not Present- Breast Mass, Breast Pain, Nipple Discharge and Skin Changes. Cardiovascular Not Present- Chest Pain, Difficulty Breathing Lying Down, Leg Cramps, Palpitations, Rapid Heart Rate, Shortness of Breath and  Swelling of Extremities. Gastrointestinal Not Present- Abdominal Pain, Bloating, Bloody Stool, Change in Bowel Habits, Chronic diarrhea, Constipation, Difficulty Swallowing, Excessive gas, Gets full quickly at meals, Hemorrhoids, Indigestion, Nausea, Rectal Pain and Vomiting. Female Genitourinary Not Present- Frequency, Nocturia, Painful Urination, Pelvic Pain and Urgency. Musculoskeletal Not Present- Back Pain, Joint Pain, Joint Stiffness, Muscle Pain, Muscle Weakness and Swelling of Extremities. Neurological Not Present- Decreased Memory, Fainting, Headaches, Numbness, Seizures, Tingling, Tremor, Trouble walking and Weakness. Psychiatric Present- Anxiety. Not Present- Bipolar, Change in Sleep Pattern, Depression, Fearful and Frequent crying. Endocrine Not Present- Cold Intolerance, Excessive Hunger, Hair Changes, Heat Intolerance, Hot flashes and New Diabetes. Hematology Present- Easy Bruising. Not Present- Blood Thinners, Excessive bleeding, Gland problems, HIV and Persistent Infections.   Physical Exam General Mental Status-Alert. Orientation-Oriented X3. Breast Nipples-No Discharge. Breast Lump-No Palpable Breast Mass. Lymphatic Head & Neck General Head & Neck Lymphatics: Bilateral - Description - Normal. Axillary General Axillary Region: Bilateral - Description - Normal. Note: no [place] adenopathy   Assessment & Plan  CARCINOMA OF LOWER OUTER QUADRANT OF BREAST, RIGHT (C50.511) Story: MRI, port placement, primary systemic therapy, possible lumpectomy vs mastectomy We discussed the staging and pathophysiology of breast cancer. We discussed all of the different options for treatment for breast cancer including surgery, chemotherapy, radiation therapy, Herceptin, and antiestrogen therapy. both the tumor and the mass that is a csl will need excision, I think mr reasonable to try to clarify if these are two distinct areas. with 1.6 cm tumor and her 2 positive recommended  neoadjuvant therapy. We discussed a sentinel lymph node biopsy at the time  of surgery. We discussed the options for treatment of the breast cancer which included lumpectomy versus a mastectomy. We discussed the performance of the lumpectomy with radioactive seed placement. We discussed a 5-10% chance of a positive margin requiring reexcision in the operating room. We also discussed that she will need radiation therapy if she undergoes lumpectomy. We discussed mastectomy and the postoperative care for that as well. Mastectomy can be followed by reconstruction. The decision for lumpectomy vs mastectomy has no impact on decision for chemotherapy. Most mastectomy patients will not need radiation therapy. We discussed that there is no difference in her survival whether she undergoes lumpectomy with radiation therapy or antiestrogen therapy versus a mastectomy. There is also no real difference between her recurrence in the breast. will make final surgical decision based on mri and on chemo response at end. I will see her at end of chemo They are JW and will not accept blood products under any circumstance

## 2020-09-02 NOTE — Anesthesia Procedure Notes (Signed)
Procedure Name: LMA Insertion Date/Time: 09/02/2020 10:50 AM Performed by: Lieutenant Diego, CRNA Pre-anesthesia Checklist: Patient identified, Emergency Drugs available, Suction available and Patient being monitored Patient Re-evaluated:Patient Re-evaluated prior to induction Oxygen Delivery Method: Circle system utilized Preoxygenation: Pre-oxygenation with 100% oxygen Induction Type: IV induction Ventilation: Mask ventilation without difficulty LMA: LMA inserted LMA Size: 4.0 Number of attempts: 1 Placement Confirmation: positive ETCO2 and breath sounds checked- equal and bilateral Tube secured with: Tape Dental Injury: Teeth and Oropharynx as per pre-operative assessment

## 2020-09-02 NOTE — Transfer of Care (Signed)
Immediate Anesthesia Transfer of Care Note  Patient: Claudia Ortiz  Procedure(s) Performed: INSERTION PORT-A-CATH (Left Chest)  Patient Location: PACU  Anesthesia Type:General  Level of Consciousness: drowsy  Airway & Oxygen Therapy: Patient Spontanous Breathing and Patient connected to face mask oxygen  Post-op Assessment: Report given to RN and Post -op Vital signs reviewed and stable  Post vital signs: Reviewed and stable  Last Vitals:  Vitals Value Taken Time  BP 113/64 09/02/20 1142  Temp    Pulse 85 09/02/20 1143  Resp 7 09/02/20 1143  SpO2 98 % 09/02/20 1143  Vitals shown include unvalidated device data.  Last Pain:  Vitals:   09/02/20 0953  TempSrc: Oral  PainSc: 0-No pain         Complications: No complications documented.

## 2020-09-02 NOTE — Progress Notes (Signed)
HEMATOLOGY-ONCOLOGY VISIT PROGRESS NOTE  CHIEF COMPLIANT: Cycle 1 Taxol Herceptin  INTERVAL HISTORY: Claudia Ortiz is a 80 y.o. female with above-mentioned history of right breast cancer currently on neoadjuvant chemotherapy with Taxol Herceptin. Echo on 07/25/20 showed an ejection fraction of 55-60%. Her port was placed by Dr. Donne Hazel on 09/02/20. She presents today for cycle 1.   Oncology History  Malignant neoplasm of lower-outer quadrant of right breast of female, estrogen receptor positive ([place])  07/14/2020 Initial Diagnosis   Screening mammogram showed a 1.5cm right breast mass. US showed the 1.4cm mass at the 7 o'clock position, a 2.7cm mass at the 9 o'clock position, and benign right axillary lymph nodes. Biopsy showed ALH at the 9 o'clock position, and at the 7 o'clock position, IDC, grade 3, HER-2 positive (3+), ER+ 20% weak, PR- 0%, Ki67 40%.   09/03/2020 -  Chemotherapy    Patient is on Treatment Plan: BREAST PACLITAXEL / TRASTUZUMAB Q28D        Observations/Objective:  There were no vitals filed for this visit. There is no height or weight on file to calculate BMI.  I have reviewed the data as listed CMP Latest Ref Rng & Units 07/23/2020 07/03/2009 07/05/2008  Glucose 70 - 99 mg/dL 113(H) 119(H) 123(H)  BUN 8 - 23 mg/dL $Remove'13 16 16  'HWVyIMX$ Creatinine 0.44 - 1.00 mg/dL 0.89 0.7 0.8  Sodium 135 - 145 mmol/L 140 144 143  Potassium 3.5 - 5.1 mmol/L 4.0 3.7 4.0  Chloride 98 - 111 mmol/L 107 104 107  CO2 22 - 32 mmol/L $RemoveB'25 31 27  'dAvfqLGg$ Calcium 8.9 - 10.3 mg/dL 9.7 9.5 9.5  Total Protein 6.5 - 8.1 g/dL 7.2 7.0 6.8  Total Bilirubin 0.3 - 1.2 mg/dL 0.6 0.50 0.50  Alkaline Phos 38 - 126 U/L 82 92(H) 70  AST 15 - 41 U/L 20 37 23  ALT 0 - 44 U/L 18 42 19    Lab Results  Component Value Date   WBC 9.1 09/03/2020   HGB 12.2 09/03/2020   HCT 37.7 09/03/2020   MCV 83.0 09/03/2020   PLT 211 09/03/2020   NEUTROABS 6.4 09/03/2020      Assessment Plan:  Malignant neoplasm of lower-outer  quadrant of right breast of female, estrogen receptor positive ([place]) 07/14/2020:Screening mammogram showed a 1.5cm right breast mass. US showed the 1.4cm mass at the 7 o'clock position, a 2.7cm mass at the 9 o'clock position, and benign right axillary lymph nodes. Biopsy showed ALH at the 9 o'clock position, and at the 7 o'clock position, IDC, grade 3, HER-2 positive (3+), ER+ 20% weak, PR- 0%, Ki67 40%.  Treatment Plan: 1. Neoadjuvant chemotherapy with Taxol Herceptin followed by Herceptin maintenance for 1 year 2. Followed by breast conserving surgery if possible with sentinel lymph node study 3. Followed by adjuvant radiation therapy if patient had lumpectomy 4.  Followed by adjuvant antiestrogen therapy ------------------------------------------------------------------------------------------------------------------------------- Current Treatment: Cycle 1 Taxol Herceptin ECHO 07/25/20: EF 55-60%  She wants to try naturopathic/complementary treatments alongside her chemo. Chemo consent obtained. Chemo education completed RTC in 1 week for cycle 2 and Tox check     I discussed the assessment and treatment plan with the patient. The patient was provided an opportunity to ask questions and all were answered. The patient agreed with the plan and demonstrated an understanding of the instructions. The patient was advised to call back or seek an in-person evaluation if the symptoms worsen or if the condition fails to improve as anticipated.   Total  time spent: 30 minutes including face-to-face MyChart video visit time and time spent for planning, charting and coordination of care  Rulon Eisenmenger, MD 09/03/2020   I, Cloyde Reams Dorshimer, am acting as scribe for Nicholas Lose, MD.  I have reviewed the above documentation for accuracy and completeness, and I agree with the above.

## 2020-09-03 ENCOUNTER — Inpatient Hospital Stay: Payer: Medicare Other

## 2020-09-03 ENCOUNTER — Inpatient Hospital Stay: Payer: Medicare Other | Attending: Hematology and Oncology | Admitting: Hematology and Oncology

## 2020-09-03 ENCOUNTER — Encounter: Payer: Self-pay | Admitting: *Deleted

## 2020-09-03 ENCOUNTER — Other Ambulatory Visit: Payer: Self-pay

## 2020-09-03 ENCOUNTER — Encounter: Payer: Self-pay | Admitting: Hematology and Oncology

## 2020-09-03 ENCOUNTER — Encounter (HOSPITAL_BASED_OUTPATIENT_CLINIC_OR_DEPARTMENT_OTHER): Payer: Self-pay | Admitting: General Surgery

## 2020-09-03 VITALS — BP 155/78 | HR 69 | Temp 98.6°F | Resp 18

## 2020-09-03 DIAGNOSIS — Z888 Allergy status to other drugs, medicaments and biological substances status: Secondary | ICD-10-CM | POA: Diagnosis not present

## 2020-09-03 DIAGNOSIS — Z881 Allergy status to other antibiotic agents status: Secondary | ICD-10-CM | POA: Insufficient documentation

## 2020-09-03 DIAGNOSIS — Z5111 Encounter for antineoplastic chemotherapy: Secondary | ICD-10-CM | POA: Insufficient documentation

## 2020-09-03 DIAGNOSIS — Z170 Estrogen receptor positive status [ER+]: Secondary | ICD-10-CM

## 2020-09-03 DIAGNOSIS — Z880 Allergy status to penicillin: Secondary | ICD-10-CM | POA: Insufficient documentation

## 2020-09-03 DIAGNOSIS — C50511 Malignant neoplasm of lower-outer quadrant of right female breast: Secondary | ICD-10-CM

## 2020-09-03 DIAGNOSIS — Z5112 Encounter for antineoplastic immunotherapy: Secondary | ICD-10-CM | POA: Diagnosis not present

## 2020-09-03 DIAGNOSIS — Z79899 Other long term (current) drug therapy: Secondary | ICD-10-CM | POA: Diagnosis not present

## 2020-09-03 LAB — CMP (CANCER CENTER ONLY)
ALT: 17 U/L (ref 0–44)
AST: 18 U/L (ref 15–41)
Albumin: 3.8 g/dL (ref 3.5–5.0)
Alkaline Phosphatase: 79 U/L (ref 38–126)
Anion gap: 8 (ref 5–15)
BUN: 13 mg/dL (ref 8–23)
CO2: 25 mmol/L (ref 22–32)
Calcium: 9.4 mg/dL (ref 8.9–10.3)
Chloride: 107 mmol/L (ref 98–111)
Creatinine: 0.87 mg/dL (ref 0.44–1.00)
GFR, Estimated: >60 mL/min (ref >60)
Glucose, Bld: 111 mg/dL — ABNORMAL HIGH (ref 70–99)
Potassium: 4.1 mmol/L (ref 3.5–5.1)
Sodium: 140 mmol/L (ref 135–145)
Total Bilirubin: 0.5 mg/dL (ref 0.3–1.2)
Total Protein: 7 g/dL (ref 6.5–8.1)

## 2020-09-03 LAB — CBC WITH DIFFERENTIAL (CANCER CENTER ONLY)
Abs Immature Granulocytes: 0.04 10*3/uL (ref 0.00–0.07)
Basophils Absolute: 0 10*3/uL (ref 0.0–0.1)
Basophils Relative: 0 %
Eosinophils Absolute: 0 10*3/uL (ref 0.0–0.5)
Eosinophils Relative: 0 %
HCT: 37.7 % (ref 36.0–46.0)
Hemoglobin: 12.2 g/dL (ref 12.0–15.0)
Immature Granulocytes: 0 %
Lymphocytes Relative: 21 %
Lymphs Abs: 1.9 10*3/uL (ref 0.7–4.0)
MCH: 26.9 pg (ref 26.0–34.0)
MCHC: 32.4 g/dL (ref 30.0–36.0)
MCV: 83 fL (ref 80.0–100.0)
Monocytes Absolute: 0.7 10*3/uL (ref 0.1–1.0)
Monocytes Relative: 8 %
Neutro Abs: 6.4 10*3/uL (ref 1.7–7.7)
Neutrophils Relative %: 71 %
Platelet Count: 211 10*3/uL (ref 150–400)
RBC: 4.54 MIL/uL (ref 3.87–5.11)
RDW: 13.6 % (ref 11.5–15.5)
WBC Count: 9.1 10*3/uL (ref 4.0–10.5)
nRBC: 0 % (ref 0.0–0.2)

## 2020-09-03 MED ORDER — FAMOTIDINE IN NACL 20-0.9 MG/50ML-% IV SOLN
20.0000 mg | Freq: Once | INTRAVENOUS | Status: AC
  Administered 2020-09-03: 20 mg via INTRAVENOUS

## 2020-09-03 MED ORDER — FAMOTIDINE IN NACL 20-0.9 MG/50ML-% IV SOLN
INTRAVENOUS | Status: AC
  Filled 2020-09-03: qty 50

## 2020-09-03 MED ORDER — SODIUM CHLORIDE 0.9 % IV SOLN
50.0000 mg/m2 | Freq: Once | INTRAVENOUS | Status: AC
  Administered 2020-09-03: 102 mg via INTRAVENOUS
  Filled 2020-09-03: qty 17

## 2020-09-03 MED ORDER — DIPHENHYDRAMINE HCL 50 MG/ML IJ SOLN
25.0000 mg | Freq: Once | INTRAMUSCULAR | Status: AC
  Administered 2020-09-03: 25 mg via INTRAVENOUS

## 2020-09-03 MED ORDER — SODIUM CHLORIDE 0.9% FLUSH
10.0000 mL | INTRAVENOUS | Status: DC | PRN
  Administered 2020-09-03: 10 mL
  Filled 2020-09-03: qty 10

## 2020-09-03 MED ORDER — SODIUM CHLORIDE 0.9 % IV SOLN
Freq: Once | INTRAVENOUS | Status: AC
  Filled 2020-09-03: qty 250

## 2020-09-03 MED ORDER — SODIUM CHLORIDE 0.9 % IV SOLN
10.0000 mg | Freq: Once | INTRAVENOUS | Status: AC
  Administered 2020-09-03: 10 mg via INTRAVENOUS
  Filled 2020-09-03: qty 10

## 2020-09-03 MED ORDER — TRASTUZUMAB-ANNS CHEMO 150 MG IV SOLR
4.0000 mg/kg | Freq: Once | INTRAVENOUS | Status: AC
Start: 2020-09-03 — End: 2020-09-03
  Administered 2020-09-03: 357 mg via INTRAVENOUS
  Filled 2020-09-03: qty 17

## 2020-09-03 MED ORDER — SODIUM CHLORIDE 0.9% FLUSH
10.0000 mL | Freq: Once | INTRAVENOUS | Status: AC
  Administered 2020-09-03: 10 mL via INTRAVENOUS
  Filled 2020-09-03: qty 10

## 2020-09-03 MED ORDER — HEPARIN SOD (PORK) LOCK FLUSH 100 UNIT/ML IV SOLN
500.0000 [IU] | Freq: Once | INTRAVENOUS | Status: AC | PRN
  Administered 2020-09-03: 500 [IU]
  Filled 2020-09-03: qty 5

## 2020-09-03 MED ORDER — DIPHENHYDRAMINE HCL 50 MG/ML IJ SOLN
INTRAMUSCULAR | Status: AC
  Filled 2020-09-03: qty 1

## 2020-09-03 MED ORDER — ACETAMINOPHEN 325 MG PO TABS
650.0000 mg | ORAL_TABLET | Freq: Once | ORAL | Status: AC
  Administered 2020-09-03: 650 mg via ORAL

## 2020-09-03 MED ORDER — ACETAMINOPHEN 325 MG PO TABS
ORAL_TABLET | ORAL | Status: AC
  Filled 2020-09-03: qty 2

## 2020-09-03 NOTE — Patient Instructions (Signed)

## 2020-09-03 NOTE — Patient Instructions (Signed)
[facility] Discharge Instructions for Patients Receiving Chemotherapy  Today you received the following chemotherapy agents Trastuzumab-anns Calla Kicks) & Paclitaxel (TAXOL).  To help prevent nausea and vomiting after your treatment, we encourage you to take your nausea medication as prescribed.   If you develop nausea and vomiting that is not controlled by your nausea medication, call the clinic.   BELOW ARE SYMPTOMS THAT SHOULD BE REPORTED IMMEDIATELY:  *FEVER GREATER THAN 100.5 F  *CHILLS WITH OR WITHOUT FEVER  NAUSEA AND VOMITING THAT IS NOT CONTROLLED WITH YOUR NAUSEA MEDICATION  *UNUSUAL SHORTNESS OF BREATH  *UNUSUAL BRUISING OR BLEEDING  TENDERNESS IN MOUTH AND THROAT WITH OR WITHOUT PRESENCE OF ULCERS  *URINARY PROBLEMS  *BOWEL PROBLEMS  UNUSUAL RASH Items with * indicate a potential emergency and should be followed up as soon as possible.  Feel free to call the clinic should you have any questions or concerns. The clinic phone number is [phone].  Please show the [facility] at check-in to the Emergency Department and triage nurse.  Trastuzumab injection for infusion What is this medicine? TRASTUZUMAB (tras TOO zoo mab) is a monoclonal antibody. It is used to treat breast cancer and stomach cancer. This medicine may be used for other purposes; ask your health care provider or pharmacist if you have questions. COMMON BRAND NAME(S): Herceptin, Galvin Proffer, Trazimera What should I tell my health care provider before I take this medicine? They need to know if you have any of these conditions:  heart disease  heart failure  lung or breathing disease, like asthma  an unusual or allergic reaction to trastuzumab, benzyl alcohol, or other medications, foods, dyes, or preservatives  pregnant or trying to get pregnant  breast-feeding How should I use this medicine? This drug is given as an infusion into a vein. It  is administered in a hospital or clinic by a specially trained health care professional. Talk to your pediatrician regarding the use of this medicine in children. This medicine is not approved for use in children. Overdosage: If you think you have taken too much of this medicine contact a poison control center or emergency room at once. NOTE: This medicine is only for you. Do not share this medicine with others. What if I miss a dose? It is important not to miss a dose. Call your doctor or health care professional if you are unable to keep an appointment. What may interact with this medicine? This medicine may interact with the following medications:  certain types of chemotherapy, such as daunorubicin, doxorubicin, epirubicin, and idarubicin This list may not describe all possible interactions. Give your health care provider a list of all the medicines, herbs, non-prescription drugs, or dietary supplements you use. Also tell them if you smoke, drink alcohol, or use illegal drugs. Some items may interact with your medicine. What should I watch for while using this medicine? Visit your doctor for checks on your progress. Report any side effects. Continue your course of treatment even though you feel ill unless your doctor tells you to stop. Call your doctor or health care professional for advice if you get a fever, chills or sore throat, or other symptoms of a cold or flu. Do not treat yourself. Try to avoid being around people who are sick. You may experience fever, chills and shaking during your first infusion. These effects are usually mild and can be treated with other medicines. Report any side effects during the infusion to your health care  professional. Fever and chills usually do not happen with later infusions. Do not become pregnant while taking this medicine or for 7 months after stopping it. Women should inform their doctor if they wish to become pregnant or think they might be pregnant.  Women of child-bearing potential will need to have a negative pregnancy test before starting this medicine. There is a potential for serious side effects to an unborn child. Talk to your health care professional or pharmacist for more information. Do not breast-feed an infant while taking this medicine or for 7 months after stopping it. Women must use effective birth control with this medicine. What side effects may I notice from receiving this medicine? Side effects that you should report to your doctor or health care professional as soon as possible:  allergic reactions like skin rash, itching or hives, swelling of the face, lips, or tongue  chest pain or palpitations  cough  dizziness  feeling faint or lightheaded, falls  fever  general ill feeling or flu-like symptoms  signs of worsening heart failure like breathing problems; swelling in your legs and feet  unusually weak or tired Side effects that usually do not require medical attention (report to your doctor or health care professional if they continue or are bothersome):  bone pain  changes in taste  diarrhea  joint pain  nausea/vomiting  weight loss This list may not describe all possible side effects. Call your doctor for medical advice about side effects. You may report side effects to FDA at 1-800-FDA-1088. Where should I keep my medicine? This drug is given in a hospital or clinic and will not be stored at home. NOTE: This sheet is a summary. It may not cover all possible information. If you have questions about this medicine, talk to your doctor, pharmacist, or health care provider.  2021 Elsevier/Gold Standard (2016-06-29 14:37:52)  Paclitaxel injection What is this medicine? PACLITAXEL (PAK li TAX el) is a chemotherapy drug. It targets fast dividing cells, like cancer cells, and causes these cells to die. This medicine is used to treat ovarian cancer, breast cancer, lung cancer, Kaposi's sarcoma, and other  cancers. This medicine may be used for other purposes; ask your health care provider or pharmacist if you have questions. COMMON BRAND NAME(S): Onxol, Taxol What should I tell my health care provider before I take this medicine? They need to know if you have any of these conditions:  history of irregular heartbeat  liver disease  low blood counts, like low white cell, platelet, or red cell counts  lung or breathing disease, like asthma  tingling of the fingers or toes, or other nerve disorder  an unusual or allergic reaction to paclitaxel, alcohol, polyoxyethylated castor oil, other chemotherapy, other medicines, foods, dyes, or preservatives  pregnant or trying to get pregnant  breast-feeding How should I use this medicine? This drug is given as an infusion into a vein. It is administered in a hospital or clinic by a specially trained health care professional. Talk to your pediatrician regarding the use of this medicine in children. Special care may be needed. Overdosage: If you think you have taken too much of this medicine contact a poison control center or emergency room at once. NOTE: This medicine is only for you. Do not share this medicine with others. What if I miss a dose? It is important not to miss your dose. Call your doctor or health care professional if you are unable to keep an appointment. What may interact with  this medicine? Do not take this medicine with any of the following medications:  live virus vaccines This medicine may also interact with the following medications:  antiviral medicines for hepatitis, HIV or AIDS  certain antibiotics like erythromycin and clarithromycin  certain medicines for fungal infections like ketoconazole and itraconazole  certain medicines for seizures like carbamazepine, phenobarbital, phenytoin  gemfibrozil  nefazodone  rifampin  St. John's wort This list may not describe all possible interactions. Give your health  care provider a list of all the medicines, herbs, non-prescription drugs, or dietary supplements you use. Also tell them if you smoke, drink alcohol, or use illegal drugs. Some items may interact with your medicine. What should I watch for while using this medicine? Your condition will be monitored carefully while you are receiving this medicine. You will need important blood work done while you are taking this medicine. This medicine can cause serious allergic reactions. To reduce your risk you will need to take other medicine(s) before treatment with this medicine. If you experience allergic reactions like skin rash, itching or hives, swelling of the face, lips, or tongue, tell your doctor or health care professional right away. In some cases, you may be given additional medicines to help with side effects. Follow all directions for their use. This drug may make you feel generally unwell. This is not uncommon, as chemotherapy can affect healthy cells as well as cancer cells. Report any side effects. Continue your course of treatment even though you feel ill unless your doctor tells you to stop. Call your doctor or health care professional for advice if you get a fever, chills or sore throat, or other symptoms of a cold or flu. Do not treat yourself. This drug decreases your body's ability to fight infections. Try to avoid being around people who are sick. This medicine may increase your risk to bruise or bleed. Call your doctor or health care professional if you notice any unusual bleeding. Be careful brushing and flossing your teeth or using a toothpick because you may get an infection or bleed more easily. If you have any dental work done, tell your dentist you are receiving this medicine. Avoid taking products that contain aspirin, acetaminophen, ibuprofen, naproxen, or ketoprofen unless instructed by your doctor. These medicines may hide a fever. Do not become pregnant while taking this medicine.  Women should inform their doctor if they wish to become pregnant or think they might be pregnant. There is a potential for serious side effects to an unborn child. Talk to your health care professional or pharmacist for more information. Do not breast-feed an infant while taking this medicine. Men are advised not to father a child while receiving this medicine. This product may contain alcohol. Ask your pharmacist or healthcare provider if this medicine contains alcohol. Be sure to tell all healthcare providers you are taking this medicine. Certain medicines, like metronidazole and disulfiram, can cause an unpleasant reaction when taken with alcohol. The reaction includes flushing, headache, nausea, vomiting, sweating, and increased thirst. The reaction can last from 30 minutes to several hours. What side effects may I notice from receiving this medicine? Side effects that you should report to your doctor or health care professional as soon as possible:  allergic reactions like skin rash, itching or hives, swelling of the face, lips, or tongue  breathing problems  changes in vision  fast, irregular heartbeat  high or low blood pressure  mouth sores  pain, tingling, numbness in the hands or feet  signs of decreased platelets or bleeding - bruising, pinpoint red spots on the skin, black, tarry stools, blood in the urine  signs of decreased red blood cells - unusually weak or tired, feeling faint or lightheaded, falls  signs of infection - fever or chills, cough, sore throat, pain or difficulty passing urine  signs and symptoms of liver injury like dark yellow or brown urine; general ill feeling or flu-like symptoms; light-colored stools; loss of appetite; nausea; right upper belly pain; unusually weak or tired; yellowing of the eyes or skin  swelling of the ankles, feet, hands  unusually slow heartbeat Side effects that usually do not require medical attention (report to your doctor or  health care professional if they continue or are bothersome):  diarrhea  hair loss  loss of appetite  muscle or joint pain  nausea, vomiting  pain, redness, or irritation at site where injected  tiredness This list may not describe all possible side effects. Call your doctor for medical advice about side effects. You may report side effects to FDA at 1-800-FDA-1088. Where should I keep my medicine? This drug is given in a hospital or clinic and will not be stored at home. NOTE: This sheet is a summary. It may not cover all possible information. If you have questions about this medicine, talk to your doctor, pharmacist, or health care provider.  2021 Elsevier/Gold Standard (2019-06-06 13:37:23)

## 2020-09-03 NOTE — Progress Notes (Signed)
I met w/ pt to introduce myself as her Arboriculturist.  Unfortunately there aren't any foundations offering copay assistance for her Dx and the type of ins she has.  I offered the [employer], went over what it covers, gave her the income requirement and an expense sheet.  She would like to discuss with her husband and get back to me so she has my card to contact me and for any questions or concerns she may have in the future.

## 2020-09-05 ENCOUNTER — Telehealth: Payer: Self-pay | Admitting: *Deleted

## 2020-09-05 NOTE — Telephone Encounter (Signed)
Called pt to see how she did with her recent treatment.  She reports doing well except some constipation & has taken "Smooth Move" & hopes that it will work today.  We discussed getting on a stool softener before her next treatment to see if that will help & to also discuss with her MD at next visit & to call if she needs further help.  She reports understanding & knows number to call.

## 2020-09-05 NOTE — Telephone Encounter (Signed)
-----   Message from Georgianne Fick, RN sent at 09/03/2020  4:00 PM EST ----- Regarding: Dr. Lindi Adie First Chemo Patient received first time Trastuzumab-anns [facility]) and Paclitaxel (TAXOL) today and tolerated these well.

## 2020-09-09 NOTE — Assessment & Plan Note (Signed)
07/14/2020:Screening mammogram showed a 1.5cm right breast mass. US showed the 1.4cm mass at the 7 o'clock position, a 2.7cm mass at the 9 o'clock position, and benign right axillary lymph nodes. Biopsy showed ALH at the 9 o'clock position, and at the 7 o'clock position, IDC, grade 3, HER-2 positive (3+), ER+ 20% weak, PR- 0%, Ki67 40%.  Treatment Plan: 1. Neoadjuvant chemotherapy withTaxol Herceptinfollowed by Herceptin maintenance for 1 year 2. Followed by breast conserving surgery if possible with sentinel lymph node study 3. Followed by adjuvant radiation therapy if patient had lumpectomy 4.Followed by adjuvant antiestrogen therapy ------------------------------------------------------------------------------------------------------------------------------- Current Treatment: Cycle 2 Taxol Herceptin ECHO 07/25/20: EF 55-60% Chemo Toxicities:  She wants to try naturopathic/complementary treatments alongside her chemo. RTC weekly for chemo and every other week for follow up with me.

## 2020-09-09 NOTE — Progress Notes (Signed)
 Patient Care Team: Griffin, Elaine, MD as PCP - General (Family Medicine) Martini, Keisha N, RN as Nurse Navigator Stuart, Dawn C, RN as Oncology Nurse Navigator Wakefield, Matthew, MD as Consulting Physician (General Surgery) Gudena, Vinay, MD as Consulting Physician (Hematology and Oncology) Moody, John, MD as Consulting Physician (Radiation Oncology)  DIAGNOSIS:    ICD-10-CM   1. Malignant neoplasm of lower-outer quadrant of right breast of female, estrogen receptor positive (HCC)  C50.511    Z17.0     SUMMARY OF ONCOLOGIC HISTORY: Oncology History  Malignant neoplasm of lower-outer quadrant of right breast of female, estrogen receptor positive (HCC)  07/14/2020 Initial Diagnosis   Screening mammogram showed a 1.5cm right breast mass. US showed the 1.4cm mass at the 7 o'clock position, a 2.7cm mass at the 9 o'clock position, and benign right axillary lymph nodes. Biopsy showed ALH at the 9 o'clock position, and at the 7 o'clock position, IDC, grade 3, HER-2 positive (3+), ER+ 20% weak, PR- 0%, Ki67 40%.   09/03/2020 -  Chemotherapy    Patient is on Treatment Plan: BREAST PACLITAXEL / TRASTUZUMAB Q28D        CHIEF COMPLIANT: Cycle 2 Taxol Herceptin  INTERVAL HISTORY: Claudia Ortiz is a 79 y.o. with above-mentioned history of right breast cancer currently on neoadjuvant chemotherapy withTaxol Herceptin. She presents today for cycle 2.  she tolerated cycle 1 of Taxol and Herceptin extremely well without any adverse effects.  Did not have any nausea or vomiting.  ALLERGIES:  is allergic to cephalosporins, iodine, and penicillins.  MEDICATIONS:  Current Outpatient Medications  Medication Sig Dispense Refill  . anastrozole (ARIMIDEX) 1 MG tablet Take 1 tablet (1 mg total) by mouth daily. 30 tablet 3  . b complex vitamins capsule Take 1 capsule by mouth daily.    . Garlic 10 MG CAPS Take 1 capsule by mouth daily.    . lidocaine-prilocaine (EMLA) cream Apply to affected area  once 30 g 3  . Multiple Vitamin (MULTIVITAMIN) tablet Take 1 tablet by mouth daily.    . ondansetron (ZOFRAN) 8 MG tablet Take 1 tablet (8 mg total) by mouth 2 (two) times daily as needed (Nausea or vomiting). 30 tablet 1  . prochlorperazine (COMPAZINE) 10 MG tablet Take 1 tablet (10 mg total) by mouth every 6 (six) hours as needed (Nausea or vomiting). 30 tablet 1  . traMADol (ULTRAM) 50 MG tablet Take 1 tablet (50 mg total) by mouth every 6 (six) hours as needed. 10 tablet 0  . Vitamin A 7.5 MG (25000 UT) CAPS Take 1 capsule by mouth daily.    . vitamin D, CHOLECALCIFEROL, 400 UNITS tablet Take 400 Units by mouth daily.    . Zinc Oxide-Vitamin C (ZINC PLUS VITAMIN C PO) Take 1 capsule by mouth daily.     No current facility-administered medications for this visit.    PHYSICAL EXAMINATION: ECOG PERFORMANCE STATUS: 1 - Symptomatic but completely ambulatory  Vitals:   09/10/20 1121  BP: (!) 145/63  Pulse: 71  Resp: 18  Temp: 97.9 F (36.6 C)  SpO2: 100%   Filed Weights   09/10/20 1121  Weight: 189 lb 12.8 oz (86.1 kg)    LABORATORY DATA:  I have reviewed the data as listed CMP Latest Ref Rng & Units 09/03/2020 07/23/2020 07/03/2009  Glucose 70 - 99 mg/dL 111(H) 113(H) 119(H)  BUN 8 - 23 mg/dL 13 13 16  Creatinine 0.44 - 1.00 mg/dL 0.87 0.89 0.7  Sodium 135 - 145 mmol/L   140 140 144  Potassium 3.5 - 5.1 mmol/L 4.1 4.0 3.7  Chloride 98 - 111 mmol/L 107 107 104  CO2 22 - 32 mmol/L 25 25 31  Calcium 8.9 - 10.3 mg/dL 9.4 9.7 9.5  Total Protein 6.5 - 8.1 g/dL 7.0 7.2 7.0  Total Bilirubin 0.3 - 1.2 mg/dL 0.5 0.6 0.50  Alkaline Phos 38 - 126 U/L 79 82 92(H)  AST 15 - 41 U/L 18 20 37  ALT 0 - 44 U/L 17 18 42    Lab Results  Component Value Date   WBC 4.1 09/10/2020   HGB 11.9 (L) 09/10/2020   HCT 37.2 09/10/2020   MCV 84.2 09/10/2020   PLT 206 09/10/2020   NEUTROABS 1.6 (L) 09/10/2020    ASSESSMENT & PLAN:  Malignant neoplasm of lower-outer quadrant of right breast of  female, estrogen receptor positive (HCC) 07/14/2020:Screening mammogram showed a 1.5cm right breast mass. US showed the 1.4cm mass at the 7 o'clock position, a 2.7cm mass at the 9 o'clock position, and benign right axillary lymph nodes. Biopsy showed ALH at the 9 o'clock position, and at the 7 o'clock position, IDC, grade 3, HER-2 positive (3+), ER+ 20% weak, PR- 0%, Ki67 40%.  Treatment Plan: 1. Neoadjuvant chemotherapy withTaxol Herceptinfollowed by Herceptin maintenance for 1 year 2. Followed by breast conserving surgery if possible with sentinel lymph node study 3. Followed by adjuvant radiation therapy if patient had lumpectomy 4.Followed by adjuvant antiestrogen therapy ------------------------------------------------------------------------------------------------------------------------------- Current Treatment: Cycle 2 Taxol Herceptin ECHO 07/25/20: EF 55-60% Chemo Toxicities: She tolerated chemo extremely well without any side effects. We will continue with the same dosage. ANC today is 1.6: Proceed with treatment.  We will watch this closely.  She wants to try naturopathic/complementary treatments alongside her chemo. RTC weekly for chemo and every other week for follow up with me.    No orders of the defined types were placed in this encounter.  The patient has a good understanding of the overall plan. she agrees with it. she will call with any problems that may develop before the next visit here.  Total time spent: 30 mins including face to face time and time spent for planning, charting and coordination of care  Vinay K Gudena, MD, MPH 09/10/2020  I, Molly Dorshimer, am acting as scribe for Dr. Vinay Gudena.  I have reviewed the above documentation for accuracy and completeness, and I agree with the above.       

## 2020-09-10 ENCOUNTER — Inpatient Hospital Stay: Payer: Medicare Other

## 2020-09-10 ENCOUNTER — Inpatient Hospital Stay (HOSPITAL_BASED_OUTPATIENT_CLINIC_OR_DEPARTMENT_OTHER): Payer: Medicare Other | Admitting: Hematology and Oncology

## 2020-09-10 ENCOUNTER — Other Ambulatory Visit: Payer: Self-pay

## 2020-09-10 ENCOUNTER — Encounter: Payer: Self-pay | Admitting: *Deleted

## 2020-09-10 DIAGNOSIS — Z5111 Encounter for antineoplastic chemotherapy: Secondary | ICD-10-CM | POA: Diagnosis not present

## 2020-09-10 DIAGNOSIS — C50511 Malignant neoplasm of lower-outer quadrant of right female breast: Secondary | ICD-10-CM

## 2020-09-10 DIAGNOSIS — Z170 Estrogen receptor positive status [ER+]: Secondary | ICD-10-CM

## 2020-09-10 DIAGNOSIS — Z95828 Presence of other vascular implants and grafts: Secondary | ICD-10-CM

## 2020-09-10 DIAGNOSIS — Z881 Allergy status to other antibiotic agents status: Secondary | ICD-10-CM | POA: Diagnosis not present

## 2020-09-10 DIAGNOSIS — Z5112 Encounter for antineoplastic immunotherapy: Secondary | ICD-10-CM | POA: Diagnosis not present

## 2020-09-10 DIAGNOSIS — Z888 Allergy status to other drugs, medicaments and biological substances status: Secondary | ICD-10-CM | POA: Diagnosis not present

## 2020-09-10 DIAGNOSIS — Z79899 Other long term (current) drug therapy: Secondary | ICD-10-CM | POA: Diagnosis not present

## 2020-09-10 DIAGNOSIS — Z880 Allergy status to penicillin: Secondary | ICD-10-CM | POA: Diagnosis not present

## 2020-09-10 LAB — CMP (CANCER CENTER ONLY)
ALT: 37 U/L (ref 0–44)
AST: 25 U/L (ref 15–41)
Albumin: 3.6 g/dL (ref 3.5–5.0)
Alkaline Phosphatase: 84 U/L (ref 38–126)
Anion gap: 8 (ref 5–15)
BUN: 14 mg/dL (ref 8–23)
CO2: 24 mmol/L (ref 22–32)
Calcium: 9.2 mg/dL (ref 8.9–10.3)
Chloride: 108 mmol/L (ref 98–111)
Creatinine: 0.94 mg/dL (ref 0.44–1.00)
GFR, Estimated: >60 mL/min (ref >60)
Glucose, Bld: 116 mg/dL — ABNORMAL HIGH (ref 70–99)
Potassium: 4 mmol/L (ref 3.5–5.1)
Sodium: 140 mmol/L (ref 135–145)
Total Bilirubin: 0.4 mg/dL (ref 0.3–1.2)
Total Protein: 6.8 g/dL (ref 6.5–8.1)

## 2020-09-10 LAB — CBC WITH DIFFERENTIAL (CANCER CENTER ONLY)
Abs Immature Granulocytes: 0.03 10*3/uL (ref 0.00–0.07)
Basophils Absolute: 0.1 10*3/uL (ref 0.0–0.1)
Basophils Relative: 1 %
Eosinophils Absolute: 0.1 10*3/uL (ref 0.0–0.5)
Eosinophils Relative: 3 %
HCT: 37.2 % (ref 36.0–46.0)
Hemoglobin: 11.9 g/dL — ABNORMAL LOW (ref 12.0–15.0)
Immature Granulocytes: 1 %
Lymphocytes Relative: 48 %
Lymphs Abs: 2 10*3/uL (ref 0.7–4.0)
MCH: 26.9 pg (ref 26.0–34.0)
MCHC: 32 g/dL (ref 30.0–36.0)
MCV: 84.2 fL (ref 80.0–100.0)
Monocytes Absolute: 0.3 10*3/uL (ref 0.1–1.0)
Monocytes Relative: 8 %
Neutro Abs: 1.6 10*3/uL — ABNORMAL LOW (ref 1.7–7.7)
Neutrophils Relative %: 39 %
Platelet Count: 206 10*3/uL (ref 150–400)
RBC: 4.42 MIL/uL (ref 3.87–5.11)
RDW: 13.7 % (ref 11.5–15.5)
WBC Count: 4.1 10*3/uL (ref 4.0–10.5)
nRBC: 0 % (ref 0.0–0.2)

## 2020-09-10 MED ORDER — SODIUM CHLORIDE 0.9 % IV SOLN
10.0000 mg | Freq: Once | INTRAVENOUS | Status: AC
  Administered 2020-09-10: 10 mg via INTRAVENOUS
  Filled 2020-09-10: qty 10

## 2020-09-10 MED ORDER — DIPHENHYDRAMINE HCL 50 MG/ML IJ SOLN
INTRAMUSCULAR | Status: AC
  Filled 2020-09-10: qty 1

## 2020-09-10 MED ORDER — DIPHENHYDRAMINE HCL 50 MG/ML IJ SOLN
25.0000 mg | Freq: Once | INTRAMUSCULAR | Status: AC
  Administered 2020-09-10: 25 mg via INTRAVENOUS

## 2020-09-10 MED ORDER — SODIUM CHLORIDE 0.9% FLUSH
10.0000 mL | INTRAVENOUS | Status: DC | PRN
  Administered 2020-09-10: 10 mL
  Filled 2020-09-10: qty 10

## 2020-09-10 MED ORDER — TRASTUZUMAB-ANNS CHEMO 150 MG IV SOLR
2.0000 mg/kg | Freq: Once | INTRAVENOUS | Status: AC
  Administered 2020-09-10: 189 mg via INTRAVENOUS
  Filled 2020-09-10: qty 9

## 2020-09-10 MED ORDER — FAMOTIDINE IN NACL 20-0.9 MG/50ML-% IV SOLN
INTRAVENOUS | Status: AC
  Filled 2020-09-10: qty 50

## 2020-09-10 MED ORDER — ACETAMINOPHEN 325 MG PO TABS
650.0000 mg | ORAL_TABLET | Freq: Once | ORAL | Status: AC
  Administered 2020-09-10: 650 mg via ORAL

## 2020-09-10 MED ORDER — FAMOTIDINE IN NACL 20-0.9 MG/50ML-% IV SOLN
20.0000 mg | Freq: Once | INTRAVENOUS | Status: AC
  Administered 2020-09-10: 20 mg via INTRAVENOUS

## 2020-09-10 MED ORDER — HEPARIN SOD (PORK) LOCK FLUSH 100 UNIT/ML IV SOLN
500.0000 [IU] | Freq: Once | INTRAVENOUS | Status: AC | PRN
  Administered 2020-09-10: 500 [IU]
  Filled 2020-09-10: qty 5

## 2020-09-10 MED ORDER — SODIUM CHLORIDE 0.9 % IV SOLN
50.0000 mg/m2 | Freq: Once | INTRAVENOUS | Status: AC
  Administered 2020-09-10: 102 mg via INTRAVENOUS
  Filled 2020-09-10: qty 17

## 2020-09-10 MED ORDER — SODIUM CHLORIDE 0.9 % IV SOLN
Freq: Once | INTRAVENOUS | Status: AC
  Filled 2020-09-10: qty 250

## 2020-09-10 MED ORDER — SODIUM CHLORIDE 0.9% FLUSH
10.0000 mL | Freq: Once | INTRAVENOUS | Status: AC
  Administered 2020-09-10: 10 mL
  Filled 2020-09-10: qty 10

## 2020-09-10 MED ORDER — ACETAMINOPHEN 325 MG PO TABS
ORAL_TABLET | ORAL | Status: AC
  Filled 2020-09-10: qty 2

## 2020-09-10 NOTE — Patient Instructions (Signed)
[facility] Discharge Instructions for Patients Receiving Chemotherapy  Today you received the following chemotherapy agents Trastuzumab-anns Calla Kicks) & Paclitaxel (TAXOL).  To help prevent nausea and vomiting after your treatment, we encourage you to take your nausea medication as prescribed.   If you develop nausea and vomiting that is not controlled by your nausea medication, call the clinic.   BELOW ARE SYMPTOMS THAT SHOULD BE REPORTED IMMEDIATELY:  *FEVER GREATER THAN 100.5 F  *CHILLS WITH OR WITHOUT FEVER  NAUSEA AND VOMITING THAT IS NOT CONTROLLED WITH YOUR NAUSEA MEDICATION  *UNUSUAL SHORTNESS OF BREATH  *UNUSUAL BRUISING OR BLEEDING  TENDERNESS IN MOUTH AND THROAT WITH OR WITHOUT PRESENCE OF ULCERS  *URINARY PROBLEMS  *BOWEL PROBLEMS  UNUSUAL RASH Items with * indicate a potential emergency and should be followed up as soon as possible.  Feel free to call the clinic should you have any questions or concerns. The clinic phone number is [phone].  Please show the [facility] at check-in to the Emergency Department and triage nurse.

## 2020-09-17 ENCOUNTER — Other Ambulatory Visit: Payer: Self-pay | Admitting: Hematology and Oncology

## 2020-09-17 ENCOUNTER — Inpatient Hospital Stay: Payer: Medicare Other

## 2020-09-17 ENCOUNTER — Other Ambulatory Visit: Payer: Self-pay

## 2020-09-17 ENCOUNTER — Inpatient Hospital Stay: Payer: Medicare Other | Attending: Hematology and Oncology

## 2020-09-17 VITALS — BP 139/69 | HR 79 | Temp 97.6°F | Resp 18 | Wt 190.5 lb

## 2020-09-17 DIAGNOSIS — C50511 Malignant neoplasm of lower-outer quadrant of right female breast: Secondary | ICD-10-CM | POA: Insufficient documentation

## 2020-09-17 DIAGNOSIS — Z5111 Encounter for antineoplastic chemotherapy: Secondary | ICD-10-CM | POA: Insufficient documentation

## 2020-09-17 DIAGNOSIS — Z79899 Other long term (current) drug therapy: Secondary | ICD-10-CM | POA: Insufficient documentation

## 2020-09-17 DIAGNOSIS — R5383 Other fatigue: Secondary | ICD-10-CM | POA: Insufficient documentation

## 2020-09-17 DIAGNOSIS — Z170 Estrogen receptor positive status [ER+]: Secondary | ICD-10-CM | POA: Insufficient documentation

## 2020-09-17 DIAGNOSIS — Z5112 Encounter for antineoplastic immunotherapy: Secondary | ICD-10-CM | POA: Insufficient documentation

## 2020-09-17 DIAGNOSIS — Z95828 Presence of other vascular implants and grafts: Secondary | ICD-10-CM

## 2020-09-17 LAB — CMP (CANCER CENTER ONLY)
ALT: 28 U/L (ref 0–44)
AST: 22 U/L (ref 15–41)
Albumin: 3.6 g/dL (ref 3.5–5.0)
Alkaline Phosphatase: 97 U/L (ref 38–126)
Anion gap: 8 (ref 5–15)
BUN: 18 mg/dL (ref 8–23)
CO2: 25 mmol/L (ref 22–32)
Calcium: 9.2 mg/dL (ref 8.9–10.3)
Chloride: 108 mmol/L (ref 98–111)
Creatinine: 0.87 mg/dL (ref 0.44–1.00)
GFR, Estimated: >60 mL/min (ref >60)
Glucose, Bld: 113 mg/dL — ABNORMAL HIGH (ref 70–99)
Potassium: 4.1 mmol/L (ref 3.5–5.1)
Sodium: 141 mmol/L (ref 135–145)
Total Bilirubin: 0.3 mg/dL (ref 0.3–1.2)
Total Protein: 6.9 g/dL (ref 6.5–8.1)

## 2020-09-17 LAB — CBC WITH DIFFERENTIAL (CANCER CENTER ONLY)
Abs Immature Granulocytes: 0.03 10*3/uL (ref 0.00–0.07)
Basophils Absolute: 0.1 10*3/uL (ref 0.0–0.1)
Basophils Relative: 1 %
Eosinophils Absolute: 0.1 10*3/uL (ref 0.0–0.5)
Eosinophils Relative: 2 %
HCT: 38 % (ref 36.0–46.0)
Hemoglobin: 11.9 g/dL — ABNORMAL LOW (ref 12.0–15.0)
Immature Granulocytes: 1 %
Lymphocytes Relative: 54 %
Lymphs Abs: 2.3 10*3/uL (ref 0.7–4.0)
MCH: 26.6 pg (ref 26.0–34.0)
MCHC: 31.3 g/dL (ref 30.0–36.0)
MCV: 84.8 fL (ref 80.0–100.0)
Monocytes Absolute: 0.3 10*3/uL (ref 0.1–1.0)
Monocytes Relative: 8 %
Neutro Abs: 1.4 10*3/uL — ABNORMAL LOW (ref 1.7–7.7)
Neutrophils Relative %: 34 %
Platelet Count: 224 10*3/uL (ref 150–400)
RBC: 4.48 MIL/uL (ref 3.87–5.11)
RDW: 14.4 % (ref 11.5–15.5)
WBC Count: 4.2 10*3/uL (ref 4.0–10.5)
nRBC: 0 % (ref 0.0–0.2)

## 2020-09-17 MED ORDER — DIPHENHYDRAMINE HCL 50 MG/ML IJ SOLN
25.0000 mg | Freq: Once | INTRAMUSCULAR | Status: AC
  Administered 2020-09-17: 25 mg via INTRAVENOUS

## 2020-09-17 MED ORDER — TRASTUZUMAB-ANNS CHEMO 150 MG IV SOLR
2.0000 mg/kg | Freq: Once | INTRAVENOUS | Status: AC
  Administered 2020-09-17: 189 mg via INTRAVENOUS
  Filled 2020-09-17: qty 9

## 2020-09-17 MED ORDER — FAMOTIDINE IN NACL 20-0.9 MG/50ML-% IV SOLN
20.0000 mg | Freq: Once | INTRAVENOUS | Status: AC
  Administered 2020-09-17: 20 mg via INTRAVENOUS

## 2020-09-17 MED ORDER — FAMOTIDINE IN NACL 20-0.9 MG/50ML-% IV SOLN
INTRAVENOUS | Status: AC
  Filled 2020-09-17: qty 50

## 2020-09-17 MED ORDER — SODIUM CHLORIDE 0.9% FLUSH
10.0000 mL | Freq: Once | INTRAVENOUS | Status: AC
  Administered 2020-09-17: 10 mL
  Filled 2020-09-17: qty 10

## 2020-09-17 MED ORDER — SODIUM CHLORIDE 0.9 % IV SOLN
Freq: Once | INTRAVENOUS | Status: AC
  Filled 2020-09-17: qty 250

## 2020-09-17 MED ORDER — ACETAMINOPHEN 325 MG PO TABS
650.0000 mg | ORAL_TABLET | Freq: Once | ORAL | Status: AC
  Administered 2020-09-17: 650 mg via ORAL

## 2020-09-17 MED ORDER — SODIUM CHLORIDE 0.9 % IV SOLN
50.0000 mg/m2 | Freq: Once | INTRAVENOUS | Status: AC
  Administered 2020-09-17: 102 mg via INTRAVENOUS
  Filled 2020-09-17: qty 17

## 2020-09-17 MED ORDER — ACETAMINOPHEN 325 MG PO TABS
ORAL_TABLET | ORAL | Status: AC
  Filled 2020-09-17: qty 2

## 2020-09-17 MED ORDER — SODIUM CHLORIDE 0.9% FLUSH
10.0000 mL | INTRAVENOUS | Status: DC | PRN
  Administered 2020-09-17: 10 mL
  Filled 2020-09-17: qty 10

## 2020-09-17 MED ORDER — HEPARIN SOD (PORK) LOCK FLUSH 100 UNIT/ML IV SOLN
500.0000 [IU] | Freq: Once | INTRAVENOUS | Status: AC | PRN
  Administered 2020-09-17: 500 [IU]
  Filled 2020-09-17: qty 5

## 2020-09-17 MED ORDER — DIPHENHYDRAMINE HCL 50 MG/ML IJ SOLN
INTRAMUSCULAR | Status: AC
  Filled 2020-09-17: qty 1

## 2020-09-17 MED ORDER — SODIUM CHLORIDE 0.9 % IV SOLN
10.0000 mg | Freq: Once | INTRAVENOUS | Status: AC
  Administered 2020-09-17: 10 mg via INTRAVENOUS
  Filled 2020-09-17: qty 10

## 2020-09-17 NOTE — Patient Instructions (Signed)

## 2020-09-17 NOTE — Progress Notes (Signed)
Per Dr. Lindi Adie, ok to proceed with ANC 1.4.  Patient with complaint of "sharp stabs every so often" in area of biopsy. Patient states the "stabs" only last about a second and she first noticed them on Monday (09/15/20). No observable swelling, redness or discharge at site. Dr. Lindi Adie made aware. Per Dr. Lindi Adie, pain may be related to healing from biopsy. Patient is to monitor site and report any changes. Patient informed of Dr. Geralyn Flash response and verbalized understanding.

## 2020-09-17 NOTE — Patient Instructions (Addendum)
[facility] Discharge Instructions for Patients Receiving Chemotherapy  Today you received the following chemotherapy agents Trastuzumab-anns Claudia Ortiz) & Paclitaxel (TAXOL).  To help prevent nausea and vomiting after your treatment, we encourage you to take your nausea medication as prescribed.   If you develop nausea and vomiting that is not controlled by your nausea medication, call the clinic.   BELOW ARE SYMPTOMS THAT SHOULD BE REPORTED IMMEDIATELY:  *FEVER GREATER THAN 100.5 F  *CHILLS WITH OR WITHOUT FEVER  NAUSEA AND VOMITING THAT IS NOT CONTROLLED WITH YOUR NAUSEA MEDICATION  *UNUSUAL SHORTNESS OF BREATH  *UNUSUAL BRUISING OR BLEEDING  TENDERNESS IN MOUTH AND THROAT WITH OR WITHOUT PRESENCE OF ULCERS  *URINARY PROBLEMS  *BOWEL PROBLEMS  UNUSUAL RASH Items with * indicate a potential emergency and should be followed up as soon as possible.  Feel free to call the clinic should you have any questions or concerns. The clinic phone number is [phone].  Please show the CHEMO ALERT CARD at check-in to the Emergency Department and triage nurse.   Neutropenia Neutropenia is a condition that occurs when you have a lower-than-normal level of a type of white blood cell (neutrophil) in your body. Neutrophils are made in the spongy center of large bones (bone marrow), and they fight infections. Neutrophils are your body's main defense against bacterial and fungal infections. The fewer neutrophils you have and the longer your body remains without them, the greater your risk of getting a severe infection. What are the causes? This condition can occur if your body uses up or destroys neutrophils faster than your bone marrow can make them. Neutropenia may be caused by:  A bacterial or fungal infection.  Allergic disorders.  Reactions to some medicines.  An autoimmune disease.  An enlarged spleen. This condition can also occur if your bone marrow does not  produce enough neutrophils. This problem may be caused by:  Cancer.  Cancer treatments, such as radiation or chemotherapy.  Viral infections.  Medicines, such as phenytoin.  Vitamin B12 deficiency.  Diseases of the bone marrow.  Environmental toxins, such as insecticides. What are the signs or symptoms? This condition does not usually cause symptoms. If symptoms are present, they are usually caused by an underlying infection. Symptoms of an infection may include:  Fever.  Chills.  Swollen glands.  Oral or anal ulcers.  Cough and shortness of breath.  Rash.  Skin infection.  Fatigue. How is this diagnosed? Your health care provider may suspect neutropenia if you have:  A condition that may cause neutropenia.  Symptoms during or after treatment for cancer.  Symptoms of infection, especially fever.  Frequent and unusual infections. This condition is diagnosed based on your medical history and a physical exam. Tests will also be done, such as:  A complete blood count (CBC).  A procedure to collect a sample of bone marrow for examination (bone marrow biopsy).  A chest X-ray.  A urine culture.  A blood culture. How is this treated? Treatment depends on the underlying cause and severity of your condition. Mild neutropenia may not require treatment. Treatment may include medicines, such as:  Antibiotic medicine given through an IV.  Antiviral medicines.  Antifungal medicines.  A medicine to increase neutrophil production (colony-stimulating factor). You may get this drug through an IV or by injection.  Steroids given through an IV. If an underlying condition is causing neutropenia, you may need treatment for that condition. If medicines or cancer treatments are causing neutropenia, your health  care provider may have you stop the medicines or treatment. Follow these instructions at home: Medicines  Take over-the-counter and prescription medicines only as  told by your health care provider.  Get a seasonal flu shot (influenza vaccine).  Avoid people who received a vaccine in the past 30 days if that vaccine contained a live version of the germ (live vaccine). You should not get a live vaccine. Common live vaccines are polio, MMR, chicken pox, and shingles vaccines.   Eating and drinking  Do not share food utensils.  Do not eat unpasteurized foods.  Do not eat raw or undercooked meat, eggs, or seafood.  Do not eat unwashed, raw fruits or vegetables. Lifestyle  Avoid exposure to groups of people or children.  Avoid being around people who are sick.  Avoid being around dirt or dust, such as in construction areas or gardens.  Do not provide direct care for pets. Avoid animal droppings. Do not clean litter boxes and bird cages.  Do not have sex unless your health care provider has approved. Hygiene  Bathe daily.  Clean the area between the genitals and the anus (perineal area) after you urinate or have a bowel movement. If you are female, wipe from front to back.  Brush your teeth with a soft toothbrush before and after meals.  Do not use a regular razor. Use an electric razor to remove hair.  Wash your hands often. Make sure others who come in contact with you also wash their hands. If soap and water are not available, use hand sanitizer.   General instructions  Follow any precautions as told by your health care provider to reduce your risk for injury or infection.  Take actions to avoid cuts and burns. For example: ? Be cautious when you use knives. Always cut away from yourself. ? Keep knives in protective sheaths or guards when not in use. ? Use oven mitts when you cook with a hot stove, oven, or grill. ? Stand a safe distance away from open fires.  Do not use tampons, enemas, or rectal suppositories unless your health care provider has approved.  Keep all follow-up visits as told by your health care provider. This is  important. Contact a health care provider if:  You have: ? A sore throat. ? A warm, red, or tender area on your skin. ? A cough. ? Frequent or painful urination. ? Vaginal discharge or itching.  You develop: ? Sores in your mouth or anus. ? Swollen lymph nodes. ? Red streaks on the skin. ? A rash. Get help right away if:  You have: ? A fever. ? Chills, or you start to shake.  You feel: ? Nauseous, or you vomit. ? Very fatigued. ? Short of breath. Summary  Neutropenia is a condition that occurs when you have a lower-than-normal level of a type of white blood cell (neutrophil) in your body.  This condition can occur if your body uses up or destroys neutrophils faster than your bone marrow can make them.  Treatment depends on the underlying cause and severity of your condition. Mild neutropenia may not require treatment.  Follow any precautions as told by your health care provider to reduce your risk for injury or infection. This information is not intended to replace advice given to you by your health care provider. Make sure you discuss any questions you have with your health care provider. Document Revised: 04/20/2018 Document Reviewed: 04/20/2018 Elsevier Patient Education  2021 [employer].

## 2020-09-23 NOTE — Progress Notes (Signed)
Patient Care Team: Kelton Pillar, MD as PCP - General (Family Medicine) Rockwell Germany, RN as Nurse Navigator Tressie Ellis, Paulette Blanch, RN as Oncology Nurse Navigator Rolm Bookbinder, MD as Consulting Physician (General Surgery) Nicholas Lose, MD as Consulting Physician (Hematology and Oncology) Kyung Rudd, MD as Consulting Physician (Radiation Oncology)  DIAGNOSIS:    ICD-10-CM   1. Malignant neoplasm of lower-outer quadrant of right breast of female, estrogen receptor positive ([place])  C50.511    Z17.0     SUMMARY OF ONCOLOGIC HISTORY: Oncology History  Malignant neoplasm of lower-outer quadrant of right breast of female, estrogen receptor positive ([place])  07/14/2020 Initial Diagnosis   Screening mammogram showed a 1.5cm right breast mass. US showed the 1.4cm mass at the 7 o'clock position, a 2.7cm mass at the 9 o'clock position, and benign right axillary lymph nodes. Biopsy showed ALH at the 9 o'clock position, and at the 7 o'clock position, IDC, grade 3, HER-2 positive (3+), ER+ 20% weak, PR- 0%, Ki67 40%.   09/03/2020 -  Chemotherapy    Patient is on Treatment Plan: BREAST PACLITAXEL / TRASTUZUMAB Q28D        CHIEF COMPLIANT: Cycle 4 Taxol Herceptin  INTERVAL HISTORY: Claudia Ortiz is a 80 y.o. with above-mentioned history of right breast cancer currently on neoadjuvant chemotherapy withTaxol Herceptin. She presents today for cycle 4.  She reports to have done very well from treatment.  She does feel fatigued on the day of treatments.  She denies any nausea or vomiting.  Denies any diarrhea.  She did have occasional nose bleeds.  ALLERGIES:  is allergic to cephalosporins, iodine, and penicillins.  MEDICATIONS:  Current Outpatient Medications  Medication Sig Dispense Refill  . b complex vitamins capsule Take 1 capsule by mouth daily.    . Garlic 10 MG CAPS Take 1 capsule by mouth daily.    Marland Kitchen lidocaine-prilocaine (EMLA) cream Apply to affected area once 30 g 3  . Multiple  Vitamin (MULTIVITAMIN) tablet Take 1 tablet by mouth daily.    . ondansetron (ZOFRAN) 8 MG tablet Take 1 tablet (8 mg total) by mouth 2 (two) times daily as needed (Nausea or vomiting). 30 tablet 1  . prochlorperazine (COMPAZINE) 10 MG tablet Take 1 tablet (10 mg total) by mouth every 6 (six) hours as needed (Nausea or vomiting). 30 tablet 1  . Vitamin A 7.5 MG (25000 UT) CAPS Take 1 capsule by mouth daily.    . vitamin D, CHOLECALCIFEROL, 400 UNITS tablet Take 400 Units by mouth daily.    . Zinc Oxide-Vitamin C (ZINC PLUS VITAMIN C PO) Take 1 capsule by mouth daily.     No current facility-administered medications for this visit.    PHYSICAL EXAMINATION: ECOG PERFORMANCE STATUS: 1 - Symptomatic but completely ambulatory  Vitals:   09/24/20 0934  BP: (!) 144/78  Pulse: 94  Resp: 18  Temp: 97.7 F (36.5 C)  SpO2: 100%   Filed Weights   09/24/20 0934  Weight: 192 lb 6.4 oz (87.3 kg)     LABORATORY DATA:  I have reviewed the data as listed CMP Latest Ref Rng & Units 09/17/2020 09/10/2020 09/03/2020  Glucose 70 - 99 mg/dL 113(H) 116(H) 111(H)  BUN 8 - 23 mg/dL $Remove'18 14 13  'xGSqZbM$ Creatinine 0.44 - 1.00 mg/dL 0.87 0.94 0.87  Sodium 135 - 145 mmol/L 141 140 140  Potassium 3.5 - 5.1 mmol/L 4.1 4.0 4.1  Chloride 98 - 111 mmol/L 108 108 107  CO2 22 - 32 mmol/L  $'25 24 25  'L$ Calcium 8.9 - 10.3 mg/dL 9.2 9.2 9.4  Total Protein 6.5 - 8.1 g/dL 6.9 6.8 7.0  Total Bilirubin 0.3 - 1.2 mg/dL 0.3 0.4 0.5  Alkaline Phos 38 - 126 U/L 97 84 79  AST 15 - 41 U/L $Remo'22 25 18  'GiCEt$ ALT 0 - 44 U/L 28 37 17    Lab Results  Component Value Date   WBC 5.0 09/24/2020   HGB 11.2 (L) 09/24/2020   HCT 35.2 (L) 09/24/2020   MCV 84.2 09/24/2020   PLT 243 09/24/2020   NEUTROABS 2.5 09/24/2020    ASSESSMENT & PLAN:  Malignant neoplasm of lower-outer quadrant of right breast of female, estrogen receptor positive ([place]) 07/14/2020:Screening mammogram showed a 1.5cm right breast mass. US showed the 1.4cm mass at the 7  o'clock position, a 2.7cm mass at the 9 o'clock position, and benign right axillary lymph nodes. Biopsy showed ALH at the 9 o'clock position, and at the 7 o'clock position, IDC, grade 3, HER-2 positive (3+), ER+ 20% weak, PR- 0%, Ki67 40%.  Treatment Plan: 1. Neoadjuvant chemotherapy withTaxol Herceptinfollowed by Herceptin maintenance for 1 year 2. Followed by breast conserving surgery if possible with sentinel lymph node study 3. Followed by adjuvant radiation therapy if patient had lumpectomy 4.Followed by adjuvant antiestrogen therapy ------------------------------------------------------------------------------------------------------------------------------- Current Treatment: Cycle 4 Taxol Herceptin ECHO 07/25/20: EF 55-60%  Chemo Toxicities: She tolerated chemo extremely well without any side effects. We will continue with the same dosage. ANC today is 2.5 Mild nosebleeds: She will use humidifier at home. Monitoring closely for neuropathy.  She wants to try naturopathic/complementary treatments alongside her chemo. RTC weekly for chemo and every other week for follow up with me.    No orders of the defined types were placed in this encounter.  The patient has a good understanding of the overall plan. she agrees with it. she will call with any problems that may develop before the next visit here.  Total time spent: 30 mins including face to face time and time spent for planning, charting and coordination of care  Rulon Eisenmenger, MD, MPH 09/24/2020  I, Cloyde Reams Dorshimer, am acting as scribe for Dr. Nicholas Lose.  I have reviewed the above documentation for accuracy and completeness, and I agree with the above.

## 2020-09-24 ENCOUNTER — Inpatient Hospital Stay: Payer: Medicare Other | Admitting: Hematology and Oncology

## 2020-09-24 ENCOUNTER — Inpatient Hospital Stay: Payer: Medicare Other

## 2020-09-24 ENCOUNTER — Other Ambulatory Visit: Payer: Self-pay

## 2020-09-24 DIAGNOSIS — Z79899 Other long term (current) drug therapy: Secondary | ICD-10-CM | POA: Diagnosis not present

## 2020-09-24 DIAGNOSIS — Z170 Estrogen receptor positive status [ER+]: Secondary | ICD-10-CM

## 2020-09-24 DIAGNOSIS — Z95828 Presence of other vascular implants and grafts: Secondary | ICD-10-CM

## 2020-09-24 DIAGNOSIS — Z5111 Encounter for antineoplastic chemotherapy: Secondary | ICD-10-CM | POA: Diagnosis not present

## 2020-09-24 DIAGNOSIS — C50511 Malignant neoplasm of lower-outer quadrant of right female breast: Secondary | ICD-10-CM

## 2020-09-24 DIAGNOSIS — Z5112 Encounter for antineoplastic immunotherapy: Secondary | ICD-10-CM | POA: Diagnosis not present

## 2020-09-24 DIAGNOSIS — R5383 Other fatigue: Secondary | ICD-10-CM | POA: Diagnosis not present

## 2020-09-24 LAB — CBC WITH DIFFERENTIAL (CANCER CENTER ONLY)
Abs Immature Granulocytes: 0.04 10*3/uL (ref 0.00–0.07)
Basophils Absolute: 0.1 10*3/uL (ref 0.0–0.1)
Basophils Relative: 1 %
Eosinophils Absolute: 0.1 10*3/uL (ref 0.0–0.5)
Eosinophils Relative: 2 %
HCT: 35.2 % — ABNORMAL LOW (ref 36.0–46.0)
Hemoglobin: 11.2 g/dL — ABNORMAL LOW (ref 12.0–15.0)
Immature Granulocytes: 1 %
Lymphocytes Relative: 39 %
Lymphs Abs: 1.9 10*3/uL (ref 0.7–4.0)
MCH: 26.8 pg (ref 26.0–34.0)
MCHC: 31.8 g/dL (ref 30.0–36.0)
MCV: 84.2 fL (ref 80.0–100.0)
Monocytes Absolute: 0.4 10*3/uL (ref 0.1–1.0)
Monocytes Relative: 8 %
Neutro Abs: 2.5 10*3/uL (ref 1.7–7.7)
Neutrophils Relative %: 49 %
Platelet Count: 243 10*3/uL (ref 150–400)
RBC: 4.18 MIL/uL (ref 3.87–5.11)
RDW: 14.6 % (ref 11.5–15.5)
WBC Count: 5 10*3/uL (ref 4.0–10.5)
nRBC: 0 % (ref 0.0–0.2)

## 2020-09-24 LAB — CMP (CANCER CENTER ONLY)
ALT: 25 U/L (ref 0–44)
AST: 19 U/L (ref 15–41)
Albumin: 3.5 g/dL (ref 3.5–5.0)
Alkaline Phosphatase: 81 U/L (ref 38–126)
Anion gap: 10 (ref 5–15)
BUN: 14 mg/dL (ref 8–23)
CO2: 24 mmol/L (ref 22–32)
Calcium: 9 mg/dL (ref 8.9–10.3)
Chloride: 108 mmol/L (ref 98–111)
Creatinine: 0.95 mg/dL (ref 0.44–1.00)
GFR, Estimated: >60 mL/min (ref >60)
Glucose, Bld: 152 mg/dL — ABNORMAL HIGH (ref 70–99)
Potassium: 3.7 mmol/L (ref 3.5–5.1)
Sodium: 142 mmol/L (ref 135–145)
Total Bilirubin: 0.4 mg/dL (ref 0.3–1.2)
Total Protein: 6.5 g/dL (ref 6.5–8.1)

## 2020-09-24 MED ORDER — ACETAMINOPHEN 325 MG PO TABS
ORAL_TABLET | ORAL | Status: AC
  Filled 2020-09-24: qty 2

## 2020-09-24 MED ORDER — ACETAMINOPHEN 325 MG PO TABS
650.0000 mg | ORAL_TABLET | Freq: Once | ORAL | Status: AC
  Administered 2020-09-24: 650 mg via ORAL

## 2020-09-24 MED ORDER — HEPARIN SOD (PORK) LOCK FLUSH 100 UNIT/ML IV SOLN
500.0000 [IU] | Freq: Once | INTRAVENOUS | Status: AC | PRN
  Administered 2020-09-24: 500 [IU]
  Filled 2020-09-24: qty 5

## 2020-09-24 MED ORDER — FAMOTIDINE IN NACL 20-0.9 MG/50ML-% IV SOLN
INTRAVENOUS | Status: AC
  Filled 2020-09-24: qty 50

## 2020-09-24 MED ORDER — DIPHENHYDRAMINE HCL 50 MG/ML IJ SOLN
25.0000 mg | Freq: Once | INTRAMUSCULAR | Status: AC
  Administered 2020-09-24: 25 mg via INTRAVENOUS

## 2020-09-24 MED ORDER — SODIUM CHLORIDE 0.9 % IV SOLN
50.0000 mg/m2 | Freq: Once | INTRAVENOUS | Status: AC
  Administered 2020-09-24: 102 mg via INTRAVENOUS
  Filled 2020-09-24: qty 17

## 2020-09-24 MED ORDER — TRASTUZUMAB-ANNS CHEMO 150 MG IV SOLR
2.0000 mg/kg | Freq: Once | INTRAVENOUS | Status: AC
  Administered 2020-09-24: 189 mg via INTRAVENOUS
  Filled 2020-09-24: qty 9

## 2020-09-24 MED ORDER — FAMOTIDINE IN NACL 20-0.9 MG/50ML-% IV SOLN
20.0000 mg | Freq: Once | INTRAVENOUS | Status: AC
  Administered 2020-09-24: 20 mg via INTRAVENOUS

## 2020-09-24 MED ORDER — SODIUM CHLORIDE 0.9 % IV SOLN
Freq: Once | INTRAVENOUS | Status: AC
  Filled 2020-09-24: qty 250

## 2020-09-24 MED ORDER — DIPHENHYDRAMINE HCL 50 MG/ML IJ SOLN
INTRAMUSCULAR | Status: AC
  Filled 2020-09-24: qty 1

## 2020-09-24 MED ORDER — SODIUM CHLORIDE 0.9% FLUSH
10.0000 mL | INTRAVENOUS | Status: DC | PRN
  Administered 2020-09-24: 10 mL
  Filled 2020-09-24: qty 10

## 2020-09-24 MED ORDER — SODIUM CHLORIDE 0.9% FLUSH
10.0000 mL | Freq: Once | INTRAVENOUS | Status: AC
  Administered 2020-09-24: 10 mL
  Filled 2020-09-24: qty 10

## 2020-09-24 MED ORDER — SODIUM CHLORIDE 0.9 % IV SOLN
10.0000 mg | Freq: Once | INTRAVENOUS | Status: AC
  Administered 2020-09-24: 10 mg via INTRAVENOUS
  Filled 2020-09-24: qty 10

## 2020-09-24 NOTE — Patient Instructions (Signed)
Implanted Port Insertion, Care After This sheet gives you information about how to care for yourself after your procedure. Your health care provider may also give you more specific instructions. If you have problems or questions, contact your health care provider. What can I expect after the procedure? After the procedure, it is common to have:  Discomfort at the port insertion site.  Bruising on the skin over the port. This should improve over 3-4 days. Follow these instructions at home: Port care  After your port is placed, you will get a manufacturer's information card. The card has information about your port. Keep this card with you at all times.  Take care of the port as told by your health care provider. Ask your health care provider if you or a family member can get training for taking care of the port at home. A home health care nurse may also take care of the port.  Make sure to remember what type of port you have. Incision care  Follow instructions from your health care provider about how to take care of your port insertion site. Make sure you: ? Wash your hands with soap and water before and after you change your bandage (dressing). If soap and water are not available, use hand sanitizer. ? Change your dressing as told by your health care provider. ? Leave stitches (sutures), skin glue, or adhesive strips in place. These skin closures may need to stay in place for 2 weeks or longer. If adhesive strip edges start to loosen and curl up, you may trim the loose edges. Do not remove adhesive strips completely unless your health care provider tells you to do that.  Check your port insertion site every day for signs of infection. Check for: ? Redness, swelling, or pain. ? Fluid or blood. ? Warmth. ? Pus or a bad smell.      Activity  Return to your normal activities as told by your health care provider. Ask your health care provider what activities are safe for you.  Do not  lift anything that is heavier than 10 lb (4.5 kg), or the limit that you are told, until your health care provider says that it is safe. General instructions  Take over-the-counter and prescription medicines only as told by your health care provider.  Do not take baths, swim, or use a hot tub until your health care provider approves. Ask your health care provider if you may take showers. You may only be allowed to take sponge baths.  Do not drive for 24 hours if you were given a sedative during your procedure.  Wear a medical alert bracelet in case of an emergency. This will tell any health care providers that you have a port.  Keep all follow-up visits as told by your health care provider. This is important. Contact a health care provider if:  You cannot flush your port with saline as directed, or you cannot draw blood from the port.  You have a fever or chills.  You have redness, swelling, or pain around your port insertion site.  You have fluid or blood coming from your port insertion site.  Your port insertion site feels warm to the touch.  You have pus or a bad smell coming from the port insertion site. Get help right away if:  You have chest pain or shortness of breath.  You have bleeding from your port that you cannot control. Summary  Take care of the port as told by your   health care provider. Keep the manufacturer's information card with you at all times.  Change your dressing as told by your health care provider.  Contact a health care provider if you have a fever or chills or if you have redness, swelling, or pain around your port insertion site.  Keep all follow-up visits as told by your health care provider. This information is not intended to replace advice given to you by your health care provider. Make sure you discuss any questions you have with your health care provider. Document Revised: 01/31/2018 Document Reviewed: 01/31/2018 Elsevier Patient Education   2021 [employer]  

## 2020-09-24 NOTE — Assessment & Plan Note (Signed)
07/14/2020:Screening mammogram showed a 1.5cm right breast mass. US showed the 1.4cm mass at the 7 o'clock position, a 2.7cm mass at the 9 o'clock position, and benign right axillary lymph nodes. Biopsy showed ALH at the 9 o'clock position, and at the 7 o'clock position, IDC, grade 3, HER-2 positive (3+), ER+ 20% weak, PR- 0%, Ki67 40%.  Treatment Plan: 1. Neoadjuvant chemotherapy withTaxol Herceptinfollowed by Herceptin maintenance for 1 year 2. Followed by breast conserving surgery if possible with sentinel lymph node study 3. Followed by adjuvant radiation therapy if patient had lumpectomy 4.Followed by adjuvant antiestrogen therapy ------------------------------------------------------------------------------------------------------------------------------- Current Treatment: Cycle 4 Taxol Herceptin ECHO 07/25/20: EF 55-60%  Chemo Toxicities: She tolerated chemo extremely well without any side effects. We will continue with the same dosage. ANC today is 1.6: Proceed with treatment.  We will watch this closely.  She wants to try naturopathic/complementary treatments alongside her chemo. RTC weekly for chemo and every other week for follow up with me.

## 2020-09-24 NOTE — Patient Instructions (Signed)
[facility] Discharge Instructions for Patients Receiving Chemotherapy  Today you received the following chemotherapy agents Trastuzumab-anns Ernestine Mcmurray) & Paclitaxel (TAXOL).  To help prevent nausea and vomiting after your treatment, we encourage you to take your nausea medication as prescribed.   If you develop nausea and vomiting that is not controlled by your nausea medication, call the clinic.   BELOW ARE SYMPTOMS THAT SHOULD BE REPORTED IMMEDIATELY:  *FEVER GREATER THAN 100.5 F  *CHILLS WITH OR WITHOUT FEVER  NAUSEA AND VOMITING THAT IS NOT CONTROLLED WITH YOUR NAUSEA MEDICATION  *UNUSUAL SHORTNESS OF BREATH  *UNUSUAL BRUISING OR BLEEDING  TENDERNESS IN MOUTH AND THROAT WITH OR WITHOUT PRESENCE OF ULCERS  *URINARY PROBLEMS  *BOWEL PROBLEMS  UNUSUAL RASH Items with * indicate a potential emergency and should be followed up as soon as possible.  Feel free to call the clinic should you have any questions or concerns. The clinic phone number is [phone].  Please show the CHEMO ALERT CARD at check-in to the Emergency Department and triage nurse.   Neutropenia Neutropenia is a condition that occurs when you have a lower-than-normal level of a type of white blood cell (neutrophil) in your body. Neutrophils are made in the spongy center of large bones (bone marrow), and they fight infections. Neutrophils are your body's main defense against bacterial and fungal infections. The fewer neutrophils you have and the longer your body remains without them, the greater your risk of getting a severe infection. What are the causes? This condition can occur if your body uses up or destroys neutrophils faster than your bone marrow can make them. Neutropenia may be caused by:  A bacterial or fungal infection.  Allergic disorders.  Reactions to some medicines.  An autoimmune disease.  An enlarged spleen. This condition can also occur if your bone marrow does not  produce enough neutrophils. This problem may be caused by:  Cancer.  Cancer treatments, such as radiation or chemotherapy.  Viral infections.  Medicines, such as phenytoin.  Vitamin B12 deficiency.  Diseases of the bone marrow.  Environmental toxins, such as insecticides. What are the signs or symptoms? This condition does not usually cause symptoms. If symptoms are present, they are usually caused by an underlying infection. Symptoms of an infection may include:  Fever.  Chills.  Swollen glands.  Oral or anal ulcers.  Cough and shortness of breath.  Rash.  Skin infection.  Fatigue. How is this diagnosed? Your health care provider may suspect neutropenia if you have:  A condition that may cause neutropenia.  Symptoms during or after treatment for cancer.  Symptoms of infection, especially fever.  Frequent and unusual infections. This condition is diagnosed based on your medical history and a physical exam. Tests will also be done, such as:  A complete blood count (CBC).  A procedure to collect a sample of bone marrow for examination (bone marrow biopsy).  A chest X-ray.  A urine culture.  A blood culture. How is this treated? Treatment depends on the underlying cause and severity of your condition. Mild neutropenia may not require treatment. Treatment may include medicines, such as:  Antibiotic medicine given through an IV.  Antiviral medicines.  Antifungal medicines.  A medicine to increase neutrophil production (colony-stimulating factor). You may get this drug through an IV or by injection.  Steroids given through an IV. If an underlying condition is causing neutropenia, you may need treatment for that condition. If medicines or cancer treatments are causing neutropenia, your health  care provider may have you stop the medicines or treatment. Follow these instructions at home: Medicines  Take over-the-counter and prescription medicines only as  told by your health care provider.  Get a seasonal flu shot (influenza vaccine).  Avoid people who received a vaccine in the past 30 days if that vaccine contained a live version of the germ (live vaccine). You should not get a live vaccine. Common live vaccines are polio, MMR, chicken pox, and shingles vaccines.   Eating and drinking  Do not share food utensils.  Do not eat unpasteurized foods.  Do not eat raw or undercooked meat, eggs, or seafood.  Do not eat unwashed, raw fruits or vegetables. Lifestyle  Avoid exposure to groups of people or children.  Avoid being around people who are sick.  Avoid being around dirt or dust, such as in construction areas or gardens.  Do not provide direct care for pets. Avoid animal droppings. Do not clean litter boxes and bird cages.  Do not have sex unless your health care provider has approved. Hygiene  Bathe daily.  Clean the area between the genitals and the anus (perineal area) after you urinate or have a bowel movement. If you are female, wipe from front to back.  Brush your teeth with a soft toothbrush before and after meals.  Do not use a regular razor. Use an electric razor to remove hair.  Wash your hands often. Make sure others who come in contact with you also wash their hands. If soap and water are not available, use hand sanitizer.   General instructions  Follow any precautions as told by your health care provider to reduce your risk for injury or infection.  Take actions to avoid cuts and burns. For example: ? Be cautious when you use knives. Always cut away from yourself. ? Keep knives in protective sheaths or guards when not in use. ? Use oven mitts when you cook with a hot stove, oven, or grill. ? Stand a safe distance away from open fires.  Do not use tampons, enemas, or rectal suppositories unless your health care provider has approved.  Keep all follow-up visits as told by your health care provider. This is  important. Contact a health care provider if:  You have: ? A sore throat. ? A warm, red, or tender area on your skin. ? A cough. ? Frequent or painful urination. ? Vaginal discharge or itching.  You develop: ? Sores in your mouth or anus. ? Swollen lymph nodes. ? Red streaks on the skin. ? A rash. Get help right away if:  You have: ? A fever. ? Chills, or you start to shake.  You feel: ? Nauseous, or you vomit. ? Very fatigued. ? Short of breath. Summary  Neutropenia is a condition that occurs when you have a lower-than-normal level of a type of white blood cell (neutrophil) in your body.  This condition can occur if your body uses up or destroys neutrophils faster than your bone marrow can make them.  Treatment depends on the underlying cause and severity of your condition. Mild neutropenia may not require treatment.  Follow any precautions as told by your health care provider to reduce your risk for injury or infection. This information is not intended to replace advice given to you by your health care provider. Make sure you discuss any questions you have with your health care provider. Document Revised: 04/20/2018 Document Reviewed: 04/20/2018 Elsevier Patient Education  2021 [employer].

## 2020-09-25 ENCOUNTER — Telehealth: Payer: Self-pay | Admitting: Hematology and Oncology

## 2020-09-25 NOTE — Telephone Encounter (Signed)
Scheduled per 3/9 los. Pt will receive an updated appt calendar per next visit appt notes

## 2020-10-01 ENCOUNTER — Inpatient Hospital Stay: Payer: Medicare Other

## 2020-10-01 ENCOUNTER — Other Ambulatory Visit: Payer: Self-pay

## 2020-10-01 VITALS — BP 129/91 | HR 75 | Temp 98.1°F | Resp 18

## 2020-10-01 DIAGNOSIS — Z95828 Presence of other vascular implants and grafts: Secondary | ICD-10-CM

## 2020-10-01 DIAGNOSIS — Z79899 Other long term (current) drug therapy: Secondary | ICD-10-CM | POA: Diagnosis not present

## 2020-10-01 DIAGNOSIS — C50511 Malignant neoplasm of lower-outer quadrant of right female breast: Secondary | ICD-10-CM

## 2020-10-01 DIAGNOSIS — Z5111 Encounter for antineoplastic chemotherapy: Secondary | ICD-10-CM | POA: Diagnosis not present

## 2020-10-01 DIAGNOSIS — Z170 Estrogen receptor positive status [ER+]: Secondary | ICD-10-CM

## 2020-10-01 DIAGNOSIS — R5383 Other fatigue: Secondary | ICD-10-CM | POA: Diagnosis not present

## 2020-10-01 DIAGNOSIS — Z5112 Encounter for antineoplastic immunotherapy: Secondary | ICD-10-CM | POA: Diagnosis not present

## 2020-10-01 LAB — CBC WITH DIFFERENTIAL (CANCER CENTER ONLY)
Abs Immature Granulocytes: 0.04 10*3/uL (ref 0.00–0.07)
Basophils Absolute: 0.1 10*3/uL (ref 0.0–0.1)
Basophils Relative: 1 %
Eosinophils Absolute: 0.1 10*3/uL (ref 0.0–0.5)
Eosinophils Relative: 1 %
HCT: 35.8 % — ABNORMAL LOW (ref 36.0–46.0)
Hemoglobin: 11.4 g/dL — ABNORMAL LOW (ref 12.0–15.0)
Immature Granulocytes: 1 %
Lymphocytes Relative: 39 %
Lymphs Abs: 2.1 10*3/uL (ref 0.7–4.0)
MCH: 27 pg (ref 26.0–34.0)
MCHC: 31.8 g/dL (ref 30.0–36.0)
MCV: 84.8 fL (ref 80.0–100.0)
Monocytes Absolute: 0.5 10*3/uL (ref 0.1–1.0)
Monocytes Relative: 10 %
Neutro Abs: 2.6 10*3/uL (ref 1.7–7.7)
Neutrophils Relative %: 48 %
Platelet Count: 258 10*3/uL (ref 150–400)
RBC: 4.22 MIL/uL (ref 3.87–5.11)
RDW: 15.2 % (ref 11.5–15.5)
WBC Count: 5.4 10*3/uL (ref 4.0–10.5)
nRBC: 0 % (ref 0.0–0.2)

## 2020-10-01 LAB — CMP (CANCER CENTER ONLY)
ALT: 22 U/L (ref 0–44)
AST: 19 U/L (ref 15–41)
Albumin: 3.4 g/dL — ABNORMAL LOW (ref 3.5–5.0)
Alkaline Phosphatase: 75 U/L (ref 38–126)
Anion gap: 6 (ref 5–15)
BUN: 16 mg/dL (ref 8–23)
CO2: 24 mmol/L (ref 22–32)
Calcium: 8.5 mg/dL — ABNORMAL LOW (ref 8.9–10.3)
Chloride: 110 mmol/L (ref 98–111)
Creatinine: 0.79 mg/dL (ref 0.44–1.00)
GFR, Estimated: >60 mL/min (ref >60)
Glucose, Bld: 98 mg/dL (ref 70–99)
Potassium: 4 mmol/L (ref 3.5–5.1)
Sodium: 140 mmol/L (ref 135–145)
Total Bilirubin: 0.4 mg/dL (ref 0.3–1.2)
Total Protein: 6.2 g/dL — ABNORMAL LOW (ref 6.5–8.1)

## 2020-10-01 MED ORDER — DIPHENHYDRAMINE HCL 50 MG/ML IJ SOLN
INTRAMUSCULAR | Status: AC
  Filled 2020-10-01: qty 1

## 2020-10-01 MED ORDER — DIPHENHYDRAMINE HCL 50 MG/ML IJ SOLN
25.0000 mg | Freq: Once | INTRAMUSCULAR | Status: AC
  Administered 2020-10-01: 25 mg via INTRAVENOUS

## 2020-10-01 MED ORDER — SODIUM CHLORIDE 0.9 % IV SOLN
10.0000 mg | Freq: Once | INTRAVENOUS | Status: AC
  Administered 2020-10-01: 10 mg via INTRAVENOUS
  Filled 2020-10-01: qty 10

## 2020-10-01 MED ORDER — HEPARIN SOD (PORK) LOCK FLUSH 100 UNIT/ML IV SOLN
500.0000 [IU] | Freq: Once | INTRAVENOUS | Status: AC | PRN
  Administered 2020-10-01: 500 [IU]
  Filled 2020-10-01: qty 5

## 2020-10-01 MED ORDER — SODIUM CHLORIDE 0.9 % IV SOLN
Freq: Once | INTRAVENOUS | Status: AC
  Filled 2020-10-01: qty 250

## 2020-10-01 MED ORDER — TRASTUZUMAB-ANNS CHEMO 150 MG IV SOLR
2.0000 mg/kg | Freq: Once | INTRAVENOUS | Status: AC
  Administered 2020-10-01: 189 mg via INTRAVENOUS
  Filled 2020-10-01: qty 9

## 2020-10-01 MED ORDER — SODIUM CHLORIDE 0.9% FLUSH
10.0000 mL | INTRAVENOUS | Status: DC | PRN
  Administered 2020-10-01: 10 mL
  Filled 2020-10-01: qty 10

## 2020-10-01 MED ORDER — SODIUM CHLORIDE 0.9 % IV SOLN
50.0000 mg/m2 | Freq: Once | INTRAVENOUS | Status: AC
  Administered 2020-10-01: 102 mg via INTRAVENOUS
  Filled 2020-10-01: qty 17

## 2020-10-01 MED ORDER — ACETAMINOPHEN 325 MG PO TABS
650.0000 mg | ORAL_TABLET | Freq: Once | ORAL | Status: AC
  Administered 2020-10-01: 650 mg via ORAL

## 2020-10-01 MED ORDER — FAMOTIDINE IN NACL 20-0.9 MG/50ML-% IV SOLN
20.0000 mg | Freq: Once | INTRAVENOUS | Status: AC
  Administered 2020-10-01: 20 mg via INTRAVENOUS

## 2020-10-01 MED ORDER — FAMOTIDINE IN NACL 20-0.9 MG/50ML-% IV SOLN
INTRAVENOUS | Status: AC
  Filled 2020-10-01: qty 50

## 2020-10-01 MED ORDER — ACETAMINOPHEN 325 MG PO TABS
ORAL_TABLET | ORAL | Status: AC
  Filled 2020-10-01: qty 2

## 2020-10-01 MED ORDER — SODIUM CHLORIDE 0.9% FLUSH
10.0000 mL | Freq: Once | INTRAVENOUS | Status: AC
Start: 2020-10-01 — End: 2020-10-01
  Administered 2020-10-01: 10 mL
  Filled 2020-10-01: qty 10

## 2020-10-01 NOTE — Patient Instructions (Signed)
[place] Discharge Instructions for Patients Receiving Chemotherapy  Today you received the following chemotherapy agents Trastuzumab-anns Calla Kicks) & Paclitaxel (TAXOL).  To help prevent nausea and vomiting after your treatment, we encourage you to take your nausea medication as prescribed.   If you develop nausea and vomiting that is not controlled by your nausea medication, call the clinic.   BELOW ARE SYMPTOMS THAT SHOULD BE REPORTED IMMEDIATELY:  *FEVER GREATER THAN 100.5 F  *CHILLS WITH OR WITHOUT FEVER  NAUSEA AND VOMITING THAT IS NOT CONTROLLED WITH YOUR NAUSEA MEDICATION  *UNUSUAL SHORTNESS OF BREATH  *UNUSUAL BRUISING OR BLEEDING  TENDERNESS IN MOUTH AND THROAT WITH OR WITHOUT PRESENCE OF ULCERS  *URINARY PROBLEMS  *BOWEL PROBLEMS  UNUSUAL RASH Items with * indicate a potential emergency and should be followed up as soon as possible.  Feel free to call the clinic should you have any questions or concerns. The clinic phone number is [phone].  Please show the [place] at check-in to the Emergency Department and triage nurse.

## 2020-10-07 NOTE — Assessment & Plan Note (Signed)
07/14/2020:Screening mammogram showed a 1.5cm right breast mass. US showed the 1.4cm mass at the 7 o'clock position, a 2.7cm mass at the 9 o'clock position, and benign right axillary lymph nodes. Biopsy showed ALH at the 9 o'clock position, and at the 7 o'clock position, IDC, grade 3, HER-2 positive (3+), ER+ 20% weak, PR- 0%, Ki67 40%.  Treatment Plan: 1. Neoadjuvant chemotherapy withTaxol Herceptinfollowed by Herceptin maintenance for 1 year 2. Followed by breast conserving surgery if possible with sentinel lymph node study 3. Followed by adjuvant radiation therapy if patient had lumpectomy 4.Followed by adjuvant antiestrogen therapy ------------------------------------------------------------------------------------------------------------------------------- Current Treatment: Cycle6Taxol Herceptin ECHO 07/25/20: EF 55-60%  Chemo Toxicities:She tolerated chemo extremely well without any side effects. We will continue with the same dosage. ANC today is 2.5 Mild nosebleeds: She will use humidifier at home. Monitoring closely for neuropathy.  She wants to try naturopathic/complementary treatments alongside her chemo. RTCweekly for chemo and every other week for follow up with me.   

## 2020-10-07 NOTE — Progress Notes (Signed)
Patient Care Team: Kelton Pillar, MD as PCP - General (Family Medicine) Rockwell Germany, RN as Nurse Navigator Tressie Ellis, Paulette Blanch, RN as Oncology Nurse Navigator Rolm Bookbinder, MD as Consulting Physician (General Surgery) Nicholas Lose, MD as Consulting Physician (Hematology and Oncology) Kyung Rudd, MD as Consulting Physician (Radiation Oncology)  DIAGNOSIS:    ICD-10-CM   1. Malignant neoplasm of lower-outer quadrant of right breast of female, estrogen receptor positive ([place])  C50.511    Z17.0     SUMMARY OF ONCOLOGIC HISTORY: Oncology History  Malignant neoplasm of lower-outer quadrant of right breast of female, estrogen receptor positive ([place])  07/14/2020 Initial Diagnosis   Screening mammogram showed a 1.5cm right breast mass. US showed the 1.4cm mass at the 7 o'clock position, a 2.7cm mass at the 9 o'clock position, and benign right axillary lymph nodes. Biopsy showed ALH at the 9 o'clock position, and at the 7 o'clock position, IDC, grade 3, HER-2 positive (3+), ER+ 20% weak, PR- 0%, Ki67 40%.   09/03/2020 -  Chemotherapy    Patient is on Treatment Plan: BREAST PACLITAXEL / TRASTUZUMAB Q28D        CHIEF COMPLIANT: Cycle6Taxol Herceptin  INTERVAL HISTORY: Claudia Ortiz is an 80 y.o. with above-mentioned history of right breast cancer currently on neoadjuvant chemotherapy withTaxol Herceptin. She presents today for cycle6.    She is tolerating chemotherapy extremely well.  Denies any peripheral neuropathy.  She does have slight fatigue.  Denies any nausea or vomiting.  Her major concern is ongoing hair loss.  ALLERGIES:  is allergic to cephalosporins, iodine, and penicillins.  MEDICATIONS:  Current Outpatient Medications  Medication Sig Dispense Refill  . b complex vitamins capsule Take 1 capsule by mouth daily.    . Garlic 10 MG CAPS Take 1 capsule by mouth daily.    Marland Kitchen lidocaine-prilocaine (EMLA) cream Apply to affected area once 30 g 3  . Multiple Vitamin  (MULTIVITAMIN) tablet Take 1 tablet by mouth daily.    . ondansetron (ZOFRAN) 8 MG tablet Take 1 tablet (8 mg total) by mouth 2 (two) times daily as needed (Nausea or vomiting). 30 tablet 1  . prochlorperazine (COMPAZINE) 10 MG tablet Take 1 tablet (10 mg total) by mouth every 6 (six) hours as needed (Nausea or vomiting). 30 tablet 1  . Vitamin A 7.5 MG (25000 UT) CAPS Take 1 capsule by mouth daily.    . vitamin D, CHOLECALCIFEROL, 400 UNITS tablet Take 400 Units by mouth daily.    . Zinc Oxide-Vitamin C (ZINC PLUS VITAMIN C PO) Take 1 capsule by mouth daily.     No current facility-administered medications for this visit.   Facility-Administered Medications Ordered in Other Visits  Medication Dose Route Frequency Provider Last Rate Last Admin  . heparin lock flush 100 unit/mL  500 Units Intracatheter Once PRN Nicholas Lose, MD      . sodium chloride flush (NS) 0.9 % injection 10 mL  10 mL Intracatheter PRN Nicholas Lose, MD        PHYSICAL EXAMINATION: ECOG PERFORMANCE STATUS: 1 - Symptomatic but completely ambulatory  Vitals:   10/08/20 0928  BP: (!) 148/84  Pulse: 86  Resp: 18  Temp: 97.7 F (36.5 C)  SpO2: 99%   Filed Weights   10/08/20 0928  Weight: 192 lb 3.2 oz (87.2 kg)    LABORATORY DATA:  I have reviewed the data as listed CMP Latest Ref Rng & Units 10/08/2020 10/01/2020 09/24/2020  Glucose 70 - 99 mg/dL 108(H)  98 152(H)  BUN 8 - 23 mg/dL $Remove'13 16 14  'wjiIHRE$ Creatinine 0.44 - 1.00 mg/dL 0.92 0.79 0.95  Sodium 135 - 145 mmol/L 143 140 142  Potassium 3.5 - 5.1 mmol/L 4.1 4.0 3.7  Chloride 98 - 111 mmol/L 107 110 108  CO2 22 - 32 mmol/L $RemoveB'24 24 24  'dwwBrHZW$ Calcium 8.9 - 10.3 mg/dL 9.4 8.5(L) 9.0  Total Protein 6.5 - 8.1 g/dL 6.6 6.2(L) 6.5  Total Bilirubin 0.3 - 1.2 mg/dL 0.4 0.4 0.4  Alkaline Phos 38 - 126 U/L 80 75 81  AST 15 - 41 U/L $Remo'18 19 19  'cUvKu$ ALT 0 - 44 U/L $Remo'21 22 25    'GNOlW$ Lab Results  Component Value Date   WBC 5.5 10/08/2020   HGB 11.2 (L) 10/08/2020   HCT 34.8 (L) 10/08/2020    MCV 84.7 10/08/2020   PLT 255 10/08/2020   NEUTROABS 2.9 10/08/2020    ASSESSMENT & PLAN:  Malignant neoplasm of lower-outer quadrant of right breast of female, estrogen receptor positive ([place]) 07/14/2020:Screening mammogram showed a 1.5cm right breast mass. US showed the 1.4cm mass at the 7 o'clock position, a 2.7cm mass at the 9 o'clock position, and benign right axillary lymph nodes. Biopsy showed ALH at the 9 o'clock position, and at the 7 o'clock position, IDC, grade 3, HER-2 positive (3+), ER+ 20% weak, PR- 0%, Ki67 40%.  Treatment Plan: 1. Neoadjuvant chemotherapy withTaxol Herceptinfollowed by Herceptin maintenance for 1 year 2. Followed by breast conserving surgery if possible with sentinel lymph node study 3. Followed by adjuvant radiation therapy if patient had lumpectomy 4.Followed by adjuvant antiestrogen therapy ------------------------------------------------------------------------------------------------------------------------------- Current Treatment: Cycle6Taxol Herceptin ECHO 07/25/20: EF 55-60%  Chemo Toxicities:  Hair loss: I gave her prescription for wig ANC today is 2.9 Mild nosebleeds: She will use humidifier at home. Monitoring closely for neuropathy.  She wants to try naturopathic/complementary treatments alongside her chemo. RTCweekly for chemo and every other week for follow up with me.    No orders of the defined types were placed in this encounter.  The patient has a good understanding of the overall plan. she agrees with it. she will call with any problems that may develop before the next visit here.  Total time spent: 30 mins including face to face time and time spent for planning, charting and coordination of care  Rulon Eisenmenger, MD, MPH 10/08/2020  I, Molly Dorshimer, am acting as scribe for Dr. Nicholas Lose.  I have reviewed the above documentation for accuracy and completeness, and I agree with the above.

## 2020-10-08 ENCOUNTER — Other Ambulatory Visit: Payer: Self-pay

## 2020-10-08 ENCOUNTER — Inpatient Hospital Stay: Payer: Medicare Other

## 2020-10-08 ENCOUNTER — Inpatient Hospital Stay: Payer: Medicare Other | Admitting: Hematology and Oncology

## 2020-10-08 ENCOUNTER — Encounter: Payer: Self-pay | Admitting: *Deleted

## 2020-10-08 DIAGNOSIS — Z170 Estrogen receptor positive status [ER+]: Secondary | ICD-10-CM

## 2020-10-08 DIAGNOSIS — Z5111 Encounter for antineoplastic chemotherapy: Secondary | ICD-10-CM | POA: Diagnosis not present

## 2020-10-08 DIAGNOSIS — Z79899 Other long term (current) drug therapy: Secondary | ICD-10-CM | POA: Diagnosis not present

## 2020-10-08 DIAGNOSIS — R5383 Other fatigue: Secondary | ICD-10-CM | POA: Diagnosis not present

## 2020-10-08 DIAGNOSIS — C50511 Malignant neoplasm of lower-outer quadrant of right female breast: Secondary | ICD-10-CM | POA: Diagnosis not present

## 2020-10-08 DIAGNOSIS — Z95828 Presence of other vascular implants and grafts: Secondary | ICD-10-CM

## 2020-10-08 DIAGNOSIS — Z5112 Encounter for antineoplastic immunotherapy: Secondary | ICD-10-CM | POA: Diagnosis not present

## 2020-10-08 LAB — CBC WITH DIFFERENTIAL (CANCER CENTER ONLY)
Abs Immature Granulocytes: 0.06 10*3/uL (ref 0.00–0.07)
Basophils Absolute: 0.1 10*3/uL (ref 0.0–0.1)
Basophils Relative: 1 %
Eosinophils Absolute: 0.1 10*3/uL (ref 0.0–0.5)
Eosinophils Relative: 2 %
HCT: 34.8 % — ABNORMAL LOW (ref 36.0–46.0)
Hemoglobin: 11.2 g/dL — ABNORMAL LOW (ref 12.0–15.0)
Immature Granulocytes: 1 %
Lymphocytes Relative: 32 %
Lymphs Abs: 1.8 10*3/uL (ref 0.7–4.0)
MCH: 27.3 pg (ref 26.0–34.0)
MCHC: 32.2 g/dL (ref 30.0–36.0)
MCV: 84.7 fL (ref 80.0–100.0)
Monocytes Absolute: 0.6 10*3/uL (ref 0.1–1.0)
Monocytes Relative: 11 %
Neutro Abs: 2.9 10*3/uL (ref 1.7–7.7)
Neutrophils Relative %: 53 %
Platelet Count: 255 10*3/uL (ref 150–400)
RBC: 4.11 MIL/uL (ref 3.87–5.11)
RDW: 15.5 % (ref 11.5–15.5)
WBC Count: 5.5 10*3/uL (ref 4.0–10.5)
nRBC: 0 % (ref 0.0–0.2)

## 2020-10-08 LAB — CMP (CANCER CENTER ONLY)
ALT: 21 U/L (ref 0–44)
AST: 18 U/L (ref 15–41)
Albumin: 3.5 g/dL (ref 3.5–5.0)
Alkaline Phosphatase: 80 U/L (ref 38–126)
Anion gap: 12 (ref 5–15)
BUN: 13 mg/dL (ref 8–23)
CO2: 24 mmol/L (ref 22–32)
Calcium: 9.4 mg/dL (ref 8.9–10.3)
Chloride: 107 mmol/L (ref 98–111)
Creatinine: 0.92 mg/dL (ref 0.44–1.00)
GFR, Estimated: >60 mL/min (ref >60)
Glucose, Bld: 108 mg/dL — ABNORMAL HIGH (ref 70–99)
Potassium: 4.1 mmol/L (ref 3.5–5.1)
Sodium: 143 mmol/L (ref 135–145)
Total Bilirubin: 0.4 mg/dL (ref 0.3–1.2)
Total Protein: 6.6 g/dL (ref 6.5–8.1)

## 2020-10-08 MED ORDER — FAMOTIDINE IN NACL 20-0.9 MG/50ML-% IV SOLN
20.0000 mg | Freq: Once | INTRAVENOUS | Status: AC
  Administered 2020-10-08: 20 mg via INTRAVENOUS

## 2020-10-08 MED ORDER — ACETAMINOPHEN 325 MG PO TABS
ORAL_TABLET | ORAL | Status: AC
  Filled 2020-10-08: qty 2

## 2020-10-08 MED ORDER — SODIUM CHLORIDE 0.9 % IV SOLN
10.0000 mg | Freq: Once | INTRAVENOUS | Status: AC
  Administered 2020-10-08: 10 mg via INTRAVENOUS
  Filled 2020-10-08: qty 10

## 2020-10-08 MED ORDER — TRASTUZUMAB-ANNS CHEMO 150 MG IV SOLR
2.0000 mg/kg | Freq: Once | INTRAVENOUS | Status: AC
  Administered 2020-10-08: 189 mg via INTRAVENOUS
  Filled 2020-10-08: qty 9

## 2020-10-08 MED ORDER — ACETAMINOPHEN 325 MG PO TABS
650.0000 mg | ORAL_TABLET | Freq: Once | ORAL | Status: AC
  Administered 2020-10-08: 650 mg via ORAL

## 2020-10-08 MED ORDER — DIPHENHYDRAMINE HCL 50 MG/ML IJ SOLN
INTRAMUSCULAR | Status: AC
  Filled 2020-10-08: qty 1

## 2020-10-08 MED ORDER — SODIUM CHLORIDE 0.9% FLUSH
10.0000 mL | Freq: Once | INTRAVENOUS | Status: AC
Start: 2020-10-08 — End: 2020-10-08
  Administered 2020-10-08: 10 mL
  Filled 2020-10-08: qty 10

## 2020-10-08 MED ORDER — HEPARIN SOD (PORK) LOCK FLUSH 100 UNIT/ML IV SOLN
500.0000 [IU] | Freq: Once | INTRAVENOUS | Status: AC | PRN
  Administered 2020-10-08: 500 [IU]
  Filled 2020-10-08: qty 5

## 2020-10-08 MED ORDER — SODIUM CHLORIDE 0.9% FLUSH
10.0000 mL | INTRAVENOUS | Status: DC | PRN
Start: 2020-10-08 — End: 2020-10-08
  Administered 2020-10-08: 10 mL
  Filled 2020-10-08: qty 10

## 2020-10-08 MED ORDER — SODIUM CHLORIDE 0.9 % IV SOLN
50.0000 mg/m2 | Freq: Once | INTRAVENOUS | Status: AC
  Administered 2020-10-08: 102 mg via INTRAVENOUS
  Filled 2020-10-08: qty 17

## 2020-10-08 MED ORDER — DIPHENHYDRAMINE HCL 50 MG/ML IJ SOLN
25.0000 mg | Freq: Once | INTRAMUSCULAR | Status: AC
  Administered 2020-10-08: 25 mg via INTRAVENOUS

## 2020-10-08 MED ORDER — SODIUM CHLORIDE 0.9 % IV SOLN
Freq: Once | INTRAVENOUS | Status: AC
  Filled 2020-10-08: qty 250

## 2020-10-08 NOTE — Patient Instructions (Signed)
Implanted Port Insertion, Care After This sheet gives you information about how to care for yourself after your procedure. Your health care provider may also give you more specific instructions. If you have problems or questions, contact your health care provider. What can I expect after the procedure? After the procedure, it is common to have:  Discomfort at the port insertion site.  Bruising on the skin over the port. This should improve over 3-4 days. Follow these instructions at home: Port care  After your port is placed, you will get a manufacturer's information card. The card has information about your port. Keep this card with you at all times.  Take care of the port as told by your health care provider. Ask your health care provider if you or a family member can get training for taking care of the port at home. A home health care nurse may also take care of the port.  Make sure to remember what type of port you have. Incision care  Follow instructions from your health care provider about how to take care of your port insertion site. Make sure you: ? Wash your hands with soap and water before and after you change your bandage (dressing). If soap and water are not available, use hand sanitizer. ? Change your dressing as told by your health care provider. ? Leave stitches (sutures), skin glue, or adhesive strips in place. These skin closures may need to stay in place for 2 weeks or longer. If adhesive strip edges start to loosen and curl up, you may trim the loose edges. Do not remove adhesive strips completely unless your health care provider tells you to do that.  Check your port insertion site every day for signs of infection. Check for: ? Redness, swelling, or pain. ? Fluid or blood. ? Warmth. ? Pus or a bad smell.      Activity  Return to your normal activities as told by your health care provider. Ask your health care provider what activities are safe for you.  Do not  lift anything that is heavier than 10 lb (4.5 kg), or the limit that you are told, until your health care provider says that it is safe. General instructions  Take over-the-counter and prescription medicines only as told by your health care provider.  Do not take baths, swim, or use a hot tub until your health care provider approves. Ask your health care provider if you may take showers. You may only be allowed to take sponge baths.  Do not drive for 24 hours if you were given a sedative during your procedure.  Wear a medical alert bracelet in case of an emergency. This will tell any health care providers that you have a port.  Keep all follow-up visits as told by your health care provider. This is important. Contact a health care provider if:  You cannot flush your port with saline as directed, or you cannot draw blood from the port.  You have a fever or chills.  You have redness, swelling, or pain around your port insertion site.  You have fluid or blood coming from your port insertion site.  Your port insertion site feels warm to the touch.  You have pus or a bad smell coming from the port insertion site. Get help right away if:  You have chest pain or shortness of breath.  You have bleeding from your port that you cannot control. Summary  Take care of the port as told by your   health care provider. Keep the manufacturer's information card with you at all times.  Change your dressing as told by your health care provider.  Contact a health care provider if you have a fever or chills or if you have redness, swelling, or pain around your port insertion site.  Keep all follow-up visits as told by your health care provider. This information is not intended to replace advice given to you by your health care provider. Make sure you discuss any questions you have with your health care provider. Document Revised: 01/31/2018 Document Reviewed: 01/31/2018 Elsevier Patient Education   2021 [employer]  

## 2020-10-08 NOTE — Patient Instructions (Signed)
[place] Discharge Instructions for Patients Receiving Chemotherapy  Today you received the following chemotherapy agents Traztuzumab-anns(Herceptin), Paclitaxel(Taxol).  To help prevent nausea and vomiting after your treatment, we encourage you to take your nausea medication as directed.   If you develop nausea and vomiting that is not controlled by your nausea medication, call the clinic.   BELOW ARE SYMPTOMS THAT SHOULD BE REPORTED IMMEDIATELY:  *FEVER GREATER THAN 100.5 F  *CHILLS WITH OR WITHOUT FEVER  NAUSEA AND VOMITING THAT IS NOT CONTROLLED WITH YOUR NAUSEA MEDICATION  *UNUSUAL SHORTNESS OF BREATH  *UNUSUAL BRUISING OR BLEEDING  TENDERNESS IN MOUTH AND THROAT WITH OR WITHOUT PRESENCE OF ULCERS  *URINARY PROBLEMS  *BOWEL PROBLEMS  UNUSUAL RASH Items with * indicate a potential emergency and should be followed up as soon as possible.  Feel free to call the clinic should you have any questions or concerns. The clinic phone number is [phone].  Please show the [place] at check-in to the Emergency Department and triage nurse.

## 2020-10-15 ENCOUNTER — Other Ambulatory Visit: Payer: Self-pay

## 2020-10-15 ENCOUNTER — Ambulatory Visit: Payer: Medicare Other | Admitting: Hematology and Oncology

## 2020-10-15 ENCOUNTER — Inpatient Hospital Stay: Payer: Medicare Other

## 2020-10-15 VITALS — BP 139/94 | HR 85 | Temp 98.3°F | Resp 18

## 2020-10-15 DIAGNOSIS — Z170 Estrogen receptor positive status [ER+]: Secondary | ICD-10-CM | POA: Diagnosis not present

## 2020-10-15 DIAGNOSIS — Z5111 Encounter for antineoplastic chemotherapy: Secondary | ICD-10-CM | POA: Diagnosis not present

## 2020-10-15 DIAGNOSIS — C50511 Malignant neoplasm of lower-outer quadrant of right female breast: Secondary | ICD-10-CM | POA: Diagnosis not present

## 2020-10-15 DIAGNOSIS — Z79899 Other long term (current) drug therapy: Secondary | ICD-10-CM | POA: Diagnosis not present

## 2020-10-15 DIAGNOSIS — Z5112 Encounter for antineoplastic immunotherapy: Secondary | ICD-10-CM | POA: Diagnosis not present

## 2020-10-15 DIAGNOSIS — R5383 Other fatigue: Secondary | ICD-10-CM | POA: Diagnosis not present

## 2020-10-15 LAB — CBC WITH DIFFERENTIAL (CANCER CENTER ONLY)
Abs Immature Granulocytes: 0.05 10*3/uL (ref 0.00–0.07)
Basophils Absolute: 0.1 10*3/uL (ref 0.0–0.1)
Basophils Relative: 1 %
Eosinophils Absolute: 0.1 10*3/uL (ref 0.0–0.5)
Eosinophils Relative: 3 %
HCT: 33.7 % — ABNORMAL LOW (ref 36.0–46.0)
Hemoglobin: 10.7 g/dL — ABNORMAL LOW (ref 12.0–15.0)
Immature Granulocytes: 1 %
Lymphocytes Relative: 34 %
Lymphs Abs: 1.6 10*3/uL (ref 0.7–4.0)
MCH: 27.4 pg (ref 26.0–34.0)
MCHC: 31.8 g/dL (ref 30.0–36.0)
MCV: 86.2 fL (ref 80.0–100.0)
Monocytes Absolute: 0.5 10*3/uL (ref 0.1–1.0)
Monocytes Relative: 11 %
Neutro Abs: 2.5 10*3/uL (ref 1.7–7.7)
Neutrophils Relative %: 50 %
Platelet Count: 244 10*3/uL (ref 150–400)
RBC: 3.91 MIL/uL (ref 3.87–5.11)
RDW: 16.1 % — ABNORMAL HIGH (ref 11.5–15.5)
WBC Count: 4.9 10*3/uL (ref 4.0–10.5)
nRBC: 0 % (ref 0.0–0.2)

## 2020-10-15 LAB — CMP (CANCER CENTER ONLY)
ALT: 17 U/L (ref 0–44)
AST: 17 U/L (ref 15–41)
Albumin: 3.4 g/dL — ABNORMAL LOW (ref 3.5–5.0)
Alkaline Phosphatase: 84 U/L (ref 38–126)
Anion gap: 9 (ref 5–15)
BUN: 13 mg/dL (ref 8–23)
CO2: 25 mmol/L (ref 22–32)
Calcium: 8.8 mg/dL — ABNORMAL LOW (ref 8.9–10.3)
Chloride: 107 mmol/L (ref 98–111)
Creatinine: 0.84 mg/dL (ref 0.44–1.00)
GFR, Estimated: >60 mL/min (ref >60)
Glucose, Bld: 136 mg/dL — ABNORMAL HIGH (ref 70–99)
Potassium: 3.8 mmol/L (ref 3.5–5.1)
Sodium: 141 mmol/L (ref 135–145)
Total Bilirubin: 0.4 mg/dL (ref 0.3–1.2)
Total Protein: 6.3 g/dL — ABNORMAL LOW (ref 6.5–8.1)

## 2020-10-15 MED ORDER — SODIUM CHLORIDE 0.9 % IV SOLN
10.0000 mg | Freq: Once | INTRAVENOUS | Status: AC
  Administered 2020-10-15: 10 mg via INTRAVENOUS
  Filled 2020-10-15: qty 10

## 2020-10-15 MED ORDER — FAMOTIDINE IN NACL 20-0.9 MG/50ML-% IV SOLN
INTRAVENOUS | Status: AC
  Filled 2020-10-15: qty 50

## 2020-10-15 MED ORDER — ACETAMINOPHEN 325 MG PO TABS
ORAL_TABLET | ORAL | Status: AC
  Filled 2020-10-15: qty 2

## 2020-10-15 MED ORDER — TRASTUZUMAB-ANNS CHEMO 150 MG IV SOLR
2.0000 mg/kg | Freq: Once | INTRAVENOUS | Status: AC
Start: 2020-10-15 — End: 2020-10-15
  Administered 2020-10-15: 189 mg via INTRAVENOUS
  Filled 2020-10-15: qty 9

## 2020-10-15 MED ORDER — SODIUM CHLORIDE 0.9 % IV SOLN
50.0000 mg/m2 | Freq: Once | INTRAVENOUS | Status: AC
  Administered 2020-10-15: 102 mg via INTRAVENOUS
  Filled 2020-10-15: qty 17

## 2020-10-15 MED ORDER — SODIUM CHLORIDE 0.9 % IV SOLN
Freq: Once | INTRAVENOUS | Status: AC
  Filled 2020-10-15: qty 250

## 2020-10-15 MED ORDER — FAMOTIDINE IN NACL 20-0.9 MG/50ML-% IV SOLN
20.0000 mg | Freq: Once | INTRAVENOUS | Status: AC
  Administered 2020-10-15: 20 mg via INTRAVENOUS

## 2020-10-15 MED ORDER — HEPARIN SOD (PORK) LOCK FLUSH 100 UNIT/ML IV SOLN
500.0000 [IU] | Freq: Once | INTRAVENOUS | Status: AC | PRN
  Administered 2020-10-15: 500 [IU]
  Filled 2020-10-15: qty 5

## 2020-10-15 MED ORDER — DIPHENHYDRAMINE HCL 50 MG/ML IJ SOLN
25.0000 mg | Freq: Once | INTRAMUSCULAR | Status: AC
  Administered 2020-10-15: 25 mg via INTRAVENOUS

## 2020-10-15 MED ORDER — DIPHENHYDRAMINE HCL 50 MG/ML IJ SOLN
INTRAMUSCULAR | Status: AC
  Filled 2020-10-15: qty 1

## 2020-10-15 MED ORDER — ACETAMINOPHEN 325 MG PO TABS
650.0000 mg | ORAL_TABLET | Freq: Once | ORAL | Status: AC
  Administered 2020-10-15: 650 mg via ORAL

## 2020-10-15 MED ORDER — SODIUM CHLORIDE 0.9% FLUSH
10.0000 mL | INTRAVENOUS | Status: DC | PRN
  Administered 2020-10-15: 10 mL
  Filled 2020-10-15: qty 10

## 2020-10-15 NOTE — Patient Instructions (Signed)
[facility] [place] Discharge Instructions for Patients Receiving Chemotherapy  Today you received the following chemotherapy agents Trastuzumab and Taxol  To help prevent nausea and vomiting after your treatment, we encourage you to take your nausea medication as directed.    If you develop nausea and vomiting that is not controlled by your nausea medication, call the clinic.   BELOW ARE SYMPTOMS THAT SHOULD BE REPORTED IMMEDIATELY:  *FEVER GREATER THAN 100.5 F  *CHILLS WITH OR WITHOUT FEVER  NAUSEA AND VOMITING THAT IS NOT CONTROLLED WITH YOUR NAUSEA MEDICATION  *UNUSUAL SHORTNESS OF BREATH  *UNUSUAL BRUISING OR BLEEDING  TENDERNESS IN MOUTH AND THROAT WITH OR WITHOUT PRESENCE OF ULCERS  *URINARY PROBLEMS  *BOWEL PROBLEMS  UNUSUAL RASH Items with * indicate a potential emergency and should be followed up as soon as possible.  Feel free to call the clinic should you have any questions or concerns. The clinic phone number is [phone].  Please show the [place] at check-in to the Emergency Department and triage nurse.

## 2020-10-21 NOTE — Progress Notes (Signed)
Patient Care Team: Kelton Pillar, MD as PCP - General (Family Medicine) Rockwell Germany, RN as Nurse Navigator Tressie Ellis, Paulette Blanch, RN as Oncology Nurse Navigator Rolm Bookbinder, MD as Consulting Physician (General Surgery) Nicholas Lose, MD as Consulting Physician (Hematology and Oncology) Kyung Rudd, MD as Consulting Physician (Radiation Oncology)  DIAGNOSIS:    ICD-10-CM   1. Malignant neoplasm of lower-outer quadrant of right breast of female, estrogen receptor positive ([place])  C50.511    Z17.0     SUMMARY OF ONCOLOGIC HISTORY: Oncology History  Malignant neoplasm of lower-outer quadrant of right breast of female, estrogen receptor positive ([place])  07/14/2020 Initial Diagnosis   Screening mammogram showed a 1.5cm right breast mass. US showed the 1.4cm mass at the 7 o'clock position, a 2.7cm mass at the 9 o'clock position, and benign right axillary lymph nodes. Biopsy showed ALH at the 9 o'clock position, and at the 7 o'clock position, IDC, grade 3, HER-2 positive (3+), ER+ 20% weak, PR- 0%, Ki67 40%.   09/03/2020 -  Chemotherapy    Patient is on Treatment Plan: BREAST PACLITAXEL / TRASTUZUMAB Q28D        CHIEF COMPLIANT: Cycle8Taxol Herceptin  INTERVAL HISTORY: Claudia Ortiz is a 80 y.o. with above-mentioned history of right breast cancer currently on neoadjuvant chemotherapy withTaxol Herceptin. She presents today for cycle8.   Her major complaint today is nosebleeds.  These are recurrent and in spite of applying ointments inside the nose she is still bleeding because of the scab that is coming off of them.  Denies any nausea vomiting.  Her nails have darkening but no neuropathy.  ALLERGIES:  is allergic to cephalosporins, iodine, and penicillins.  MEDICATIONS:  Current Outpatient Medications  Medication Sig Dispense Refill  . b complex vitamins capsule Take 1 capsule by mouth daily.    . Garlic 10 MG CAPS Take 1 capsule by mouth daily.    Marland Kitchen lidocaine-prilocaine  (EMLA) cream Apply to affected area once 30 g 3  . Multiple Vitamin (MULTIVITAMIN) tablet Take 1 tablet by mouth daily.    . ondansetron (ZOFRAN) 8 MG tablet Take 1 tablet (8 mg total) by mouth 2 (two) times daily as needed (Nausea or vomiting). 30 tablet 1  . prochlorperazine (COMPAZINE) 10 MG tablet Take 1 tablet (10 mg total) by mouth every 6 (six) hours as needed (Nausea or vomiting). 30 tablet 1  . Vitamin A 7.5 MG (25000 UT) CAPS Take 1 capsule by mouth daily.    . vitamin D, CHOLECALCIFEROL, 400 UNITS tablet Take 400 Units by mouth daily.    . Zinc Oxide-Vitamin C (ZINC PLUS VITAMIN C PO) Take 1 capsule by mouth daily.     No current facility-administered medications for this visit.    PHYSICAL EXAMINATION: ECOG PERFORMANCE STATUS: 1 - Symptomatic but completely ambulatory  There were no vitals filed for this visit. There were no vitals filed for this visit.  LABORATORY DATA:  I have reviewed the data as listed CMP Latest Ref Rng & Units 10/15/2020 10/08/2020 10/01/2020  Glucose 70 - 99 mg/dL 136(H) 108(H) 98  BUN 8 - 23 mg/dL _0 Creatinine 0.44 - 1.00 mg/dL 0.84 0.92 0.79  Sodium 135 - 145 mmol/L 141 143 140  Potassium 3.5 - 5.1 mmol/L 3.8 4.1 4.0  Chloride 98 - 111 mmol/L 107 107 110  CO2 22 - 32 mmol/L _1 Calcium 8.9 - 10.3 mg/dL 8.8(L) 9.4 8.5(L)  Total Protein 6.5 - 8.1 g/dL 6.3(L)  6.6 6.2(L)  Total Bilirubin 0.3 - 1.2 mg/dL 0.4 0.4 0.4  Alkaline Phos 38 - 126 U/L 84 80 75  AST 15 - 41 U/L _0 ALT 0 - 44 U/L _1 Lab Results  Component Value Date   WBC 4.9 10/15/2020   HGB 10.7 (L) 10/15/2020   HCT 33.7 (L) 10/15/2020   MCV 86.2 10/15/2020   PLT 244 10/15/2020   NEUTROABS 2.5 10/15/2020    ASSESSMENT & PLAN:  Malignant neoplasm of lower-outer quadrant of right breast of female, estrogen receptor positive ([place]) 07/14/2020:Screening mammogram showed a 1.5cm right breast mass. US showed the 1.4cm mass at the 7 o'clock position, a 2.7cm  mass at the 9 o'clock position, and benign right axillary lymph nodes. Biopsy showed ALH at the 9 o'clock position, and at the 7 o'clock position, IDC, grade 3, HER-2 positive (3+), ER+ 20% weak, PR- 0%, Ki67 40%.  Treatment Plan: 1. Neoadjuvant chemotherapy withTaxol Herceptinfollowed by Herceptin maintenance for 1 year 2. Followed by breast conserving surgery if possible with sentinel lymph node study 3. Followed by adjuvant radiation therapy if patient had lumpectomy 4.Followed by adjuvant antiestrogen therapy ------------------------------------------------------------------------------------------------------------------------------- Current Treatment: Cycle8Taxol Herceptin ECHO 07/25/20: EF 55-60%  Chemo Toxicities:  Hair loss: I gave her prescription for wig ANC today is2.5 Mild nosebleeds: In spite of using humidifier and moisturizers to the nose.  We discussed about ENT referral if it continues to persist. Monitoring closely for neuropathy.  She wants to try naturopathic/complementary treatments alongside her chemo. RTCweekly for chemo and every other week for follow up with me.    No orders of the defined types were placed in this encounter.  The patient has a good understanding of the overall plan. she agrees with it. she will call with any problems that may develop before the next visit here.  Total time spent: 30 mins including face to face time and time spent for planning, charting and coordination of care  Rulon Eisenmenger, MD, MPH 10/22/2020  I, Cloyde Reams Dorshimer, am acting as scribe for Dr. Nicholas Lose.  I have reviewed the above documentation for accuracy and completeness, and I agree with the above.

## 2020-10-22 ENCOUNTER — Encounter: Payer: Self-pay | Admitting: *Deleted

## 2020-10-22 ENCOUNTER — Inpatient Hospital Stay: Payer: Medicare Other | Attending: Hematology and Oncology

## 2020-10-22 ENCOUNTER — Inpatient Hospital Stay: Payer: Medicare Other

## 2020-10-22 ENCOUNTER — Inpatient Hospital Stay: Payer: Medicare Other | Admitting: Hematology and Oncology

## 2020-10-22 ENCOUNTER — Other Ambulatory Visit: Payer: Self-pay

## 2020-10-22 DIAGNOSIS — Z833 Family history of diabetes mellitus: Secondary | ICD-10-CM | POA: Diagnosis not present

## 2020-10-22 DIAGNOSIS — Z170 Estrogen receptor positive status [ER+]: Secondary | ICD-10-CM

## 2020-10-22 DIAGNOSIS — Z823 Family history of stroke: Secondary | ICD-10-CM | POA: Diagnosis not present

## 2020-10-22 DIAGNOSIS — C50511 Malignant neoplasm of lower-outer quadrant of right female breast: Secondary | ICD-10-CM | POA: Diagnosis not present

## 2020-10-22 DIAGNOSIS — Z5111 Encounter for antineoplastic chemotherapy: Secondary | ICD-10-CM | POA: Diagnosis not present

## 2020-10-22 DIAGNOSIS — Z8349 Family history of other endocrine, nutritional and metabolic diseases: Secondary | ICD-10-CM | POA: Diagnosis not present

## 2020-10-22 DIAGNOSIS — R42 Dizziness and giddiness: Secondary | ICD-10-CM | POA: Diagnosis not present

## 2020-10-22 DIAGNOSIS — Z79899 Other long term (current) drug therapy: Secondary | ICD-10-CM | POA: Diagnosis not present

## 2020-10-22 DIAGNOSIS — Z5112 Encounter for antineoplastic immunotherapy: Secondary | ICD-10-CM | POA: Diagnosis not present

## 2020-10-22 DIAGNOSIS — Z95828 Presence of other vascular implants and grafts: Secondary | ICD-10-CM

## 2020-10-22 LAB — CBC WITH DIFFERENTIAL (CANCER CENTER ONLY)
Abs Immature Granulocytes: 0.07 10*3/uL (ref 0.00–0.07)
Basophils Absolute: 0.1 10*3/uL (ref 0.0–0.1)
Basophils Relative: 2 %
Eosinophils Absolute: 0.1 10*3/uL (ref 0.0–0.5)
Eosinophils Relative: 2 %
HCT: 34.5 % — ABNORMAL LOW (ref 36.0–46.0)
Hemoglobin: 11 g/dL — ABNORMAL LOW (ref 12.0–15.0)
Immature Granulocytes: 1 %
Lymphocytes Relative: 35 %
Lymphs Abs: 2.1 10*3/uL (ref 0.7–4.0)
MCH: 27.4 pg (ref 26.0–34.0)
MCHC: 31.9 g/dL (ref 30.0–36.0)
MCV: 86 fL (ref 80.0–100.0)
Monocytes Absolute: 0.6 10*3/uL (ref 0.1–1.0)
Monocytes Relative: 10 %
Neutro Abs: 3 10*3/uL (ref 1.7–7.7)
Neutrophils Relative %: 50 %
Platelet Count: 264 10*3/uL (ref 150–400)
RBC: 4.01 MIL/uL (ref 3.87–5.11)
RDW: 16.3 % — ABNORMAL HIGH (ref 11.5–15.5)
WBC Count: 6.1 10*3/uL (ref 4.0–10.5)
nRBC: 0 % (ref 0.0–0.2)

## 2020-10-22 LAB — CMP (CANCER CENTER ONLY)
ALT: 20 U/L (ref 0–44)
AST: 19 U/L (ref 15–41)
Albumin: 3.6 g/dL (ref 3.5–5.0)
Alkaline Phosphatase: 80 U/L (ref 38–126)
Anion gap: 10 (ref 5–15)
BUN: 14 mg/dL (ref 8–23)
CO2: 24 mmol/L (ref 22–32)
Calcium: 8.8 mg/dL — ABNORMAL LOW (ref 8.9–10.3)
Chloride: 107 mmol/L (ref 98–111)
Creatinine: 0.96 mg/dL (ref 0.44–1.00)
GFR, Estimated: 60 mL/min — ABNORMAL LOW (ref >60)
Glucose, Bld: 116 mg/dL — ABNORMAL HIGH (ref 70–99)
Potassium: 4 mmol/L (ref 3.5–5.1)
Sodium: 141 mmol/L (ref 135–145)
Total Bilirubin: 0.4 mg/dL (ref 0.3–1.2)
Total Protein: 6.5 g/dL (ref 6.5–8.1)

## 2020-10-22 MED ORDER — ACETAMINOPHEN 325 MG PO TABS
ORAL_TABLET | ORAL | Status: AC
  Filled 2020-10-22: qty 2

## 2020-10-22 MED ORDER — SODIUM CHLORIDE 0.9% FLUSH
10.0000 mL | INTRAVENOUS | Status: DC | PRN
  Administered 2020-10-22: 10 mL
  Filled 2020-10-22: qty 10

## 2020-10-22 MED ORDER — ACETAMINOPHEN 325 MG PO TABS
650.0000 mg | ORAL_TABLET | Freq: Once | ORAL | Status: AC
  Administered 2020-10-22: 650 mg via ORAL

## 2020-10-22 MED ORDER — DIPHENHYDRAMINE HCL 50 MG/ML IJ SOLN
INTRAMUSCULAR | Status: AC
  Filled 2020-10-22: qty 1

## 2020-10-22 MED ORDER — FAMOTIDINE IN NACL 20-0.9 MG/50ML-% IV SOLN
INTRAVENOUS | Status: AC
  Filled 2020-10-22: qty 50

## 2020-10-22 MED ORDER — SODIUM CHLORIDE 0.9 % IV SOLN
Freq: Once | INTRAVENOUS | Status: AC
Start: 2020-10-22 — End: 2020-10-22
  Filled 2020-10-22: qty 250

## 2020-10-22 MED ORDER — SODIUM CHLORIDE 0.9 % IV SOLN
10.0000 mg | Freq: Once | INTRAVENOUS | Status: AC
  Administered 2020-10-22: 10 mg via INTRAVENOUS
  Filled 2020-10-22: qty 10

## 2020-10-22 MED ORDER — HEPARIN SOD (PORK) LOCK FLUSH 100 UNIT/ML IV SOLN
500.0000 [IU] | Freq: Once | INTRAVENOUS | Status: AC | PRN
  Administered 2020-10-22: 500 [IU]
  Filled 2020-10-22: qty 5

## 2020-10-22 MED ORDER — DIPHENHYDRAMINE HCL 50 MG/ML IJ SOLN
25.0000 mg | Freq: Once | INTRAMUSCULAR | Status: AC
Start: 2020-10-22 — End: 2020-10-22
  Administered 2020-10-22: 25 mg via INTRAVENOUS

## 2020-10-22 MED ORDER — FAMOTIDINE IN NACL 20-0.9 MG/50ML-% IV SOLN
20.0000 mg | Freq: Once | INTRAVENOUS | Status: AC
  Administered 2020-10-22: 20 mg via INTRAVENOUS

## 2020-10-22 MED ORDER — SODIUM CHLORIDE 0.9 % IV SOLN
2.0000 mg/kg | Freq: Once | INTRAVENOUS | Status: AC
  Administered 2020-10-22: 189 mg via INTRAVENOUS
  Filled 2020-10-22: qty 9

## 2020-10-22 MED ORDER — SODIUM CHLORIDE 0.9 % IV SOLN
50.0000 mg/m2 | Freq: Once | INTRAVENOUS | Status: AC
  Administered 2020-10-22: 102 mg via INTRAVENOUS
  Filled 2020-10-22: qty 17

## 2020-10-22 MED ORDER — SODIUM CHLORIDE 0.9% FLUSH
10.0000 mL | Freq: Once | INTRAVENOUS | Status: AC
  Administered 2020-10-22: 10 mL
  Filled 2020-10-22: qty 10

## 2020-10-22 NOTE — Assessment & Plan Note (Signed)
07/14/2020:Screening mammogram showed a 1.5cm right breast mass. US showed the 1.4cm mass at the 7 o'clock position, a 2.7cm mass at the 9 o'clock position, and benign right axillary lymph nodes. Biopsy showed ALH at the 9 o'clock position, and at the 7 o'clock position, IDC, grade 3, HER-2 positive (3+), ER+ 20% weak, PR- 0%, Ki67 40%.  Treatment Plan: 1. Neoadjuvant chemotherapy withTaxol Herceptinfollowed by Herceptin maintenance for 1 year 2. Followed by breast conserving surgery if possible with sentinel lymph node study 3. Followed by adjuvant radiation therapy if patient had lumpectomy 4.Followed by adjuvant antiestrogen therapy ------------------------------------------------------------------------------------------------------------------------------- Current Treatment: Cycle8Taxol Herceptin ECHO 07/25/20: EF 55-60%  Chemo Toxicities:  Hair loss: I gave her prescription for wig ANC today is2.9 Mild nosebleeds: She will use humidifier at home. Monitoring closely for neuropathy.  She wants to try naturopathic/complementary treatments alongside her chemo. RTCweekly for chemo and every other week for follow up with me.

## 2020-10-22 NOTE — Patient Instructions (Signed)
[facility] Discharge Instructions for Patients Receiving Chemotherapy  Today you received the following chemotherapy agents trastuzumab, paclitaxel  To help prevent nausea and vomiting after your treatment, we encourage you to take your nausea medication as directed.   If you develop nausea and vomiting that is not controlled by your nausea medication, call the clinic.   BELOW ARE SYMPTOMS THAT SHOULD BE REPORTED IMMEDIATELY:  *FEVER GREATER THAN 100.5 F  *CHILLS WITH OR WITHOUT FEVER  NAUSEA AND VOMITING THAT IS NOT CONTROLLED WITH YOUR NAUSEA MEDICATION  *UNUSUAL SHORTNESS OF BREATH  *UNUSUAL BRUISING OR BLEEDING  TENDERNESS IN MOUTH AND THROAT WITH OR WITHOUT PRESENCE OF ULCERS  *URINARY PROBLEMS  *BOWEL PROBLEMS  UNUSUAL RASH Items with * indicate a potential emergency and should be followed up as soon as possible.  Feel free to call the clinic should you have any questions or concerns. The clinic phone number is [phone].  Please show the [place] [facility] at check-in to the Emergency Department and triage nurse.

## 2020-10-23 ENCOUNTER — Telehealth: Payer: Self-pay | Admitting: Hematology and Oncology

## 2020-10-23 NOTE — Telephone Encounter (Signed)
Scheduled appts per 4/6 los. Called pt, no answer. Left msg with appts dates and times.

## 2020-10-27 ENCOUNTER — Encounter: Payer: Self-pay | Admitting: *Deleted

## 2020-10-29 ENCOUNTER — Other Ambulatory Visit: Payer: Self-pay | Admitting: *Deleted

## 2020-10-29 ENCOUNTER — Inpatient Hospital Stay: Payer: Medicare Other

## 2020-10-29 ENCOUNTER — Ambulatory Visit (HOSPITAL_COMMUNITY)
Admission: RE | Admit: 2020-10-29 | Discharge: 2020-10-29 | Disposition: A | Discharge status: Home / Self Care | Payer: Medicare Other | Source: Ambulatory Visit | Attending: Hematology and Oncology | Admitting: Hematology and Oncology

## 2020-10-29 ENCOUNTER — Other Ambulatory Visit: Payer: Self-pay

## 2020-10-29 ENCOUNTER — Telehealth: Payer: Self-pay | Admitting: Hematology and Oncology

## 2020-10-29 VITALS — BP 138/77 | HR 85 | Temp 97.9°F | Resp 18

## 2020-10-29 DIAGNOSIS — Z5112 Encounter for antineoplastic immunotherapy: Secondary | ICD-10-CM | POA: Diagnosis not present

## 2020-10-29 DIAGNOSIS — J9811 Atelectasis: Secondary | ICD-10-CM | POA: Diagnosis not present

## 2020-10-29 DIAGNOSIS — Z170 Estrogen receptor positive status [ER+]: Secondary | ICD-10-CM

## 2020-10-29 DIAGNOSIS — Z79899 Other long term (current) drug therapy: Secondary | ICD-10-CM | POA: Diagnosis not present

## 2020-10-29 DIAGNOSIS — Z8349 Family history of other endocrine, nutritional and metabolic diseases: Secondary | ICD-10-CM | POA: Diagnosis not present

## 2020-10-29 DIAGNOSIS — Z95828 Presence of other vascular implants and grafts: Secondary | ICD-10-CM | POA: Insufficient documentation

## 2020-10-29 DIAGNOSIS — R42 Dizziness and giddiness: Secondary | ICD-10-CM | POA: Diagnosis not present

## 2020-10-29 DIAGNOSIS — C50511 Malignant neoplasm of lower-outer quadrant of right female breast: Secondary | ICD-10-CM | POA: Diagnosis not present

## 2020-10-29 DIAGNOSIS — Z833 Family history of diabetes mellitus: Secondary | ICD-10-CM | POA: Diagnosis not present

## 2020-10-29 DIAGNOSIS — Z5111 Encounter for antineoplastic chemotherapy: Secondary | ICD-10-CM | POA: Diagnosis not present

## 2020-10-29 DIAGNOSIS — Z823 Family history of stroke: Secondary | ICD-10-CM | POA: Diagnosis not present

## 2020-10-29 LAB — CBC WITH DIFFERENTIAL (CANCER CENTER ONLY)
Abs Immature Granulocytes: 0.05 10*3/uL (ref 0.00–0.07)
Basophils Absolute: 0.1 10*3/uL (ref 0.0–0.1)
Basophils Relative: 1 %
Eosinophils Absolute: 0.1 10*3/uL (ref 0.0–0.5)
Eosinophils Relative: 2 %
HCT: 35.1 % — ABNORMAL LOW (ref 36.0–46.0)
Hemoglobin: 11.4 g/dL — ABNORMAL LOW (ref 12.0–15.0)
Immature Granulocytes: 1 %
Lymphocytes Relative: 34 %
Lymphs Abs: 1.7 10*3/uL (ref 0.7–4.0)
MCH: 27.9 pg (ref 26.0–34.0)
MCHC: 32.5 g/dL (ref 30.0–36.0)
MCV: 85.8 fL (ref 80.0–100.0)
Monocytes Absolute: 0.5 10*3/uL (ref 0.1–1.0)
Monocytes Relative: 9 %
Neutro Abs: 2.7 10*3/uL (ref 1.7–7.7)
Neutrophils Relative %: 53 %
Platelet Count: 258 10*3/uL (ref 150–400)
RBC: 4.09 MIL/uL (ref 3.87–5.11)
RDW: 16.7 % — ABNORMAL HIGH (ref 11.5–15.5)
WBC Count: 5 10*3/uL (ref 4.0–10.5)
nRBC: 0 % (ref 0.0–0.2)

## 2020-10-29 LAB — CMP (CANCER CENTER ONLY)
ALT: 17 U/L (ref 0–44)
AST: 20 U/L (ref 15–41)
Albumin: 3.6 g/dL (ref 3.5–5.0)
Alkaline Phosphatase: 74 U/L (ref 38–126)
Anion gap: 10 (ref 5–15)
BUN: 13 mg/dL (ref 8–23)
CO2: 25 mmol/L (ref 22–32)
Calcium: 9.5 mg/dL (ref 8.9–10.3)
Chloride: 107 mmol/L (ref 98–111)
Creatinine: 0.82 mg/dL (ref 0.44–1.00)
GFR, Estimated: >60 mL/min (ref >60)
Glucose, Bld: 96 mg/dL (ref 70–99)
Potassium: 4 mmol/L (ref 3.5–5.1)
Sodium: 142 mmol/L (ref 135–145)
Total Bilirubin: 0.4 mg/dL (ref 0.3–1.2)
Total Protein: 6.7 g/dL (ref 6.5–8.1)

## 2020-10-29 MED ORDER — FAMOTIDINE IN NACL 20-0.9 MG/50ML-% IV SOLN
20.0000 mg | Freq: Once | INTRAVENOUS | Status: AC
  Administered 2020-10-29: 20 mg via INTRAVENOUS

## 2020-10-29 MED ORDER — FAMOTIDINE IN NACL 20-0.9 MG/50ML-% IV SOLN
INTRAVENOUS | Status: AC
  Filled 2020-10-29: qty 50

## 2020-10-29 MED ORDER — DIPHENHYDRAMINE HCL 50 MG/ML IJ SOLN
25.0000 mg | Freq: Once | INTRAMUSCULAR | Status: AC
  Administered 2020-10-29: 25 mg via INTRAVENOUS

## 2020-10-29 MED ORDER — SODIUM CHLORIDE 0.9 % IV SOLN
50.0000 mg/m2 | Freq: Once | INTRAVENOUS | Status: AC
  Administered 2020-10-29: 102 mg via INTRAVENOUS
  Filled 2020-10-29: qty 17

## 2020-10-29 MED ORDER — SODIUM CHLORIDE 0.9% FLUSH
10.0000 mL | INTRAVENOUS | Status: DC | PRN
  Administered 2020-10-29: 10 mL
  Filled 2020-10-29: qty 10

## 2020-10-29 MED ORDER — COLD PACK MISC ONCOLOGY
1.0000 | Freq: Once | Status: DC | PRN
  Filled 2020-10-29: qty 1

## 2020-10-29 MED ORDER — HEPARIN SOD (PORK) LOCK FLUSH 100 UNIT/ML IV SOLN
500.0000 [IU] | Freq: Once | INTRAVENOUS | Status: AC | PRN
  Administered 2020-10-29: 500 [IU]
  Filled 2020-10-29: qty 5

## 2020-10-29 MED ORDER — TRASTUZUMAB-ANNS CHEMO 150 MG IV SOLR
2.0000 mg/kg | Freq: Once | INTRAVENOUS | Status: AC
  Administered 2020-10-29: 189 mg via INTRAVENOUS
  Filled 2020-10-29: qty 9

## 2020-10-29 MED ORDER — SODIUM CHLORIDE 0.9 % IV SOLN
10.0000 mg | Freq: Once | INTRAVENOUS | Status: AC
  Administered 2020-10-29: 10 mg via INTRAVENOUS
  Filled 2020-10-29: qty 10

## 2020-10-29 MED ORDER — DIPHENHYDRAMINE HCL 50 MG/ML IJ SOLN
INTRAMUSCULAR | Status: AC
  Filled 2020-10-29: qty 1

## 2020-10-29 MED ORDER — ACETAMINOPHEN 325 MG PO TABS
ORAL_TABLET | ORAL | Status: AC
  Filled 2020-10-29: qty 2

## 2020-10-29 MED ORDER — SODIUM CHLORIDE 0.9 % IV SOLN
Freq: Once | INTRAVENOUS | Status: AC
  Filled 2020-10-29: qty 250

## 2020-10-29 MED ORDER — ACETAMINOPHEN 325 MG PO TABS
650.0000 mg | ORAL_TABLET | Freq: Once | ORAL | Status: AC
  Administered 2020-10-29: 650 mg via ORAL

## 2020-10-29 NOTE — Telephone Encounter (Signed)
R/s appt per pt request. Updated calendar printed for pt in infusion

## 2020-10-29 NOTE — Patient Instructions (Signed)

## 2020-10-29 NOTE — Progress Notes (Signed)
RN notified by infusion that pt needing chest xray to verify port a cath tip placement.  Chest x ray from 09/02/20 states tip is poorly visualized.  MD notified and verbal orders received to obtain chest xray for accurate tip placement.

## 2020-10-29 NOTE — Patient Instructions (Signed)
[facility] Discharge Instructions for Patients Receiving Chemotherapy  Today you received the following chemotherapy agents: Kanjinti, Taxol  To help prevent nausea and vomiting after your treatment, we encourage you to take your nausea medication as directed.    If you develop nausea and vomiting that is not controlled by your nausea medication, call the clinic.   BELOW ARE SYMPTOMS THAT SHOULD BE REPORTED IMMEDIATELY:  *FEVER GREATER THAN 100.5 F  *CHILLS WITH OR WITHOUT FEVER  NAUSEA AND VOMITING THAT IS NOT CONTROLLED WITH YOUR NAUSEA MEDICATION  *UNUSUAL SHORTNESS OF BREATH  *UNUSUAL BRUISING OR BLEEDING  TENDERNESS IN MOUTH AND THROAT WITH OR WITHOUT PRESENCE OF ULCERS  *URINARY PROBLEMS  *BOWEL PROBLEMS  UNUSUAL RASH Items with * indicate a potential emergency and should be followed up as soon as possible.  Feel free to call the clinic should you have any questions or concerns. The clinic phone number is [phone].  Please show the [place] [facility] at check-in to the Emergency Department and triage nurse.

## 2020-11-03 DIAGNOSIS — E7800 Pure hypercholesterolemia, unspecified: Secondary | ICD-10-CM | POA: Diagnosis not present

## 2020-11-03 DIAGNOSIS — Z1389 Encounter for screening for other disorder: Secondary | ICD-10-CM | POA: Diagnosis not present

## 2020-11-03 DIAGNOSIS — C50919 Malignant neoplasm of unspecified site of unspecified female breast: Secondary | ICD-10-CM | POA: Diagnosis not present

## 2020-11-03 DIAGNOSIS — M810 Age-related osteoporosis without current pathological fracture: Secondary | ICD-10-CM | POA: Diagnosis not present

## 2020-11-03 DIAGNOSIS — Z0000 Encounter for general adult medical examination without abnormal findings: Secondary | ICD-10-CM | POA: Diagnosis not present

## 2020-11-04 ENCOUNTER — Ambulatory Visit: Payer: Medicare Other

## 2020-11-04 ENCOUNTER — Other Ambulatory Visit: Payer: Self-pay | Admitting: *Deleted

## 2020-11-04 ENCOUNTER — Other Ambulatory Visit: Payer: Medicare Other

## 2020-11-04 ENCOUNTER — Ambulatory Visit: Payer: Medicare Other | Admitting: Medical

## 2020-11-04 ENCOUNTER — Encounter: Payer: Self-pay | Admitting: *Deleted

## 2020-11-04 DIAGNOSIS — C50511 Malignant neoplasm of lower-outer quadrant of right female breast: Secondary | ICD-10-CM

## 2020-11-05 ENCOUNTER — Inpatient Hospital Stay: Payer: Medicare Other

## 2020-11-05 ENCOUNTER — Encounter: Payer: Self-pay | Admitting: *Deleted

## 2020-11-05 ENCOUNTER — Other Ambulatory Visit: Payer: Self-pay

## 2020-11-05 ENCOUNTER — Inpatient Hospital Stay: Payer: Medicare Other | Admitting: Medical

## 2020-11-05 VITALS — BP 133/84 | HR 87 | Temp 96.1°F | Resp 18 | Ht 65.0 in | Wt 189.8 lb

## 2020-11-05 DIAGNOSIS — Z170 Estrogen receptor positive status [ER+]: Secondary | ICD-10-CM

## 2020-11-05 DIAGNOSIS — C50511 Malignant neoplasm of lower-outer quadrant of right female breast: Secondary | ICD-10-CM

## 2020-11-05 DIAGNOSIS — Z5112 Encounter for antineoplastic immunotherapy: Secondary | ICD-10-CM | POA: Diagnosis not present

## 2020-11-05 DIAGNOSIS — Z833 Family history of diabetes mellitus: Secondary | ICD-10-CM | POA: Diagnosis not present

## 2020-11-05 DIAGNOSIS — R42 Dizziness and giddiness: Secondary | ICD-10-CM | POA: Diagnosis not present

## 2020-11-05 DIAGNOSIS — Z79899 Other long term (current) drug therapy: Secondary | ICD-10-CM | POA: Diagnosis not present

## 2020-11-05 DIAGNOSIS — Z8349 Family history of other endocrine, nutritional and metabolic diseases: Secondary | ICD-10-CM | POA: Diagnosis not present

## 2020-11-05 DIAGNOSIS — Z823 Family history of stroke: Secondary | ICD-10-CM | POA: Diagnosis not present

## 2020-11-05 DIAGNOSIS — Z5111 Encounter for antineoplastic chemotherapy: Secondary | ICD-10-CM | POA: Diagnosis not present

## 2020-11-05 DIAGNOSIS — Z95828 Presence of other vascular implants and grafts: Secondary | ICD-10-CM

## 2020-11-05 LAB — CMP (CANCER CENTER ONLY)
ALT: 19 U/L (ref 0–44)
AST: 20 U/L (ref 15–41)
Albumin: 3.5 g/dL (ref 3.5–5.0)
Alkaline Phosphatase: 77 U/L (ref 38–126)
Anion gap: 8 (ref 5–15)
BUN: 12 mg/dL (ref 8–23)
CO2: 25 mmol/L (ref 22–32)
Calcium: 8.9 mg/dL (ref 8.9–10.3)
Chloride: 108 mmol/L (ref 98–111)
Creatinine: 0.77 mg/dL (ref 0.44–1.00)
GFR, Estimated: >60 mL/min (ref >60)
Glucose, Bld: 100 mg/dL — ABNORMAL HIGH (ref 70–99)
Potassium: 4 mmol/L (ref 3.5–5.1)
Sodium: 141 mmol/L (ref 135–145)
Total Bilirubin: 0.4 mg/dL (ref 0.3–1.2)
Total Protein: 6.2 g/dL — ABNORMAL LOW (ref 6.5–8.1)

## 2020-11-05 LAB — CBC WITH DIFFERENTIAL (CANCER CENTER ONLY)
Abs Immature Granulocytes: 0.08 10*3/uL — ABNORMAL HIGH (ref 0.00–0.07)
Basophils Absolute: 0.1 10*3/uL (ref 0.0–0.1)
Basophils Relative: 1 %
Eosinophils Absolute: 0.1 10*3/uL (ref 0.0–0.5)
Eosinophils Relative: 2 %
HCT: 32.4 % — ABNORMAL LOW (ref 36.0–46.0)
Hemoglobin: 10.7 g/dL — ABNORMAL LOW (ref 12.0–15.0)
Immature Granulocytes: 2 %
Lymphocytes Relative: 34 %
Lymphs Abs: 1.8 10*3/uL (ref 0.7–4.0)
MCH: 28.7 pg (ref 26.0–34.0)
MCHC: 33 g/dL (ref 30.0–36.0)
MCV: 86.9 fL (ref 80.0–100.0)
Monocytes Absolute: 0.6 10*3/uL (ref 0.1–1.0)
Monocytes Relative: 10 %
Neutro Abs: 2.8 10*3/uL (ref 1.7–7.7)
Neutrophils Relative %: 51 %
Platelet Count: 247 10*3/uL (ref 150–400)
RBC: 3.73 MIL/uL — ABNORMAL LOW (ref 3.87–5.11)
RDW: 17.2 % — ABNORMAL HIGH (ref 11.5–15.5)
WBC Count: 5.4 10*3/uL (ref 4.0–10.5)
nRBC: 0 % (ref 0.0–0.2)

## 2020-11-05 MED ORDER — FAMOTIDINE IN NACL 20-0.9 MG/50ML-% IV SOLN
20.0000 mg | Freq: Once | INTRAVENOUS | Status: AC
  Administered 2020-11-05: 20 mg via INTRAVENOUS

## 2020-11-05 MED ORDER — HEPARIN SOD (PORK) LOCK FLUSH 100 UNIT/ML IV SOLN
500.0000 [IU] | Freq: Once | INTRAVENOUS | Status: AC | PRN
  Administered 2020-11-05: 500 [IU]
  Filled 2020-11-05: qty 5

## 2020-11-05 MED ORDER — SODIUM CHLORIDE 0.9% FLUSH
10.0000 mL | INTRAVENOUS | Status: DC | PRN
  Administered 2020-11-05: 10 mL
  Filled 2020-11-05: qty 10

## 2020-11-05 MED ORDER — DIPHENHYDRAMINE HCL 50 MG/ML IJ SOLN
INTRAMUSCULAR | Status: AC
  Filled 2020-11-05: qty 1

## 2020-11-05 MED ORDER — DIPHENHYDRAMINE HCL 50 MG/ML IJ SOLN
25.0000 mg | Freq: Once | INTRAMUSCULAR | Status: AC
Start: 2020-11-05 — End: 2020-11-05
  Administered 2020-11-05: 25 mg via INTRAVENOUS

## 2020-11-05 MED ORDER — ACETAMINOPHEN 325 MG PO TABS
ORAL_TABLET | ORAL | Status: AC
  Filled 2020-11-05: qty 2

## 2020-11-05 MED ORDER — SODIUM CHLORIDE 0.9% FLUSH
10.0000 mL | Freq: Once | INTRAVENOUS | Status: AC
  Administered 2020-11-05: 10 mL
  Filled 2020-11-05: qty 10

## 2020-11-05 MED ORDER — FAMOTIDINE IN NACL 20-0.9 MG/50ML-% IV SOLN
INTRAVENOUS | Status: AC
  Filled 2020-11-05: qty 50

## 2020-11-05 MED ORDER — ACETAMINOPHEN 325 MG PO TABS
650.0000 mg | ORAL_TABLET | Freq: Once | ORAL | Status: AC
  Administered 2020-11-05: 650 mg via ORAL

## 2020-11-05 MED ORDER — TRASTUZUMAB-ANNS CHEMO 150 MG IV SOLR
2.0000 mg/kg | Freq: Once | INTRAVENOUS | Status: AC
  Administered 2020-11-05: 189 mg via INTRAVENOUS
  Filled 2020-11-05: qty 9

## 2020-11-05 MED ORDER — SODIUM CHLORIDE 0.9 % IV SOLN
10.0000 mg | Freq: Once | INTRAVENOUS | Status: AC
  Administered 2020-11-05: 10 mg via INTRAVENOUS
  Filled 2020-11-05: qty 10

## 2020-11-05 MED ORDER — SODIUM CHLORIDE 0.9 % IV SOLN
50.0000 mg/m2 | Freq: Once | INTRAVENOUS | Status: AC
  Administered 2020-11-05: 102 mg via INTRAVENOUS
  Filled 2020-11-05: qty 17

## 2020-11-05 MED ORDER — SODIUM CHLORIDE 0.9 % IV SOLN
Freq: Once | INTRAVENOUS | Status: AC
Start: 2020-11-05 — End: 2020-11-05
  Filled 2020-11-05: qty 250

## 2020-11-05 MED ORDER — HEPARIN SOD (PORK) LOCK FLUSH 100 UNIT/ML IV SOLN
500.0000 [IU] | Freq: Once | INTRAVENOUS | Status: DC
  Filled 2020-11-05: qty 5

## 2020-11-05 NOTE — Patient Instructions (Signed)
[facility] Discharge Instructions for Patients Receiving Chemotherapy  Today you received the following chemotherapy agents: Kanjinti, Taxol  To help prevent nausea and vomiting after your treatment, we encourage you to take your nausea medication as directed.    If you develop nausea and vomiting that is not controlled by your nausea medication, call the clinic.   BELOW ARE SYMPTOMS THAT SHOULD BE REPORTED IMMEDIATELY:  *FEVER GREATER THAN 100.5 F  *CHILLS WITH OR WITHOUT FEVER  NAUSEA AND VOMITING THAT IS NOT CONTROLLED WITH YOUR NAUSEA MEDICATION  *UNUSUAL SHORTNESS OF BREATH  *UNUSUAL BRUISING OR BLEEDING  TENDERNESS IN MOUTH AND THROAT WITH OR WITHOUT PRESENCE OF ULCERS  *URINARY PROBLEMS  *BOWEL PROBLEMS  UNUSUAL RASH Items with * indicate a potential emergency and should be followed up as soon as possible.  Feel free to call the clinic should you have any questions or concerns. The clinic phone number is [phone].  Please show the [facility] at check-in to the Emergency Department and triage nurse.

## 2020-11-05 NOTE — Progress Notes (Signed)
Symptoms Management Clinic Progress Note   Claudia Ortiz [id number] 1941-07-01 80 y.o.  [name] is managed by Dr. Nicholas Lose  Actively treated with chemotherapy/immunotherapy/hormonal therapy: Beginning paclitaxel and Herceptin today.  Current therapy: Paclitaxel and Herceptin  Next scheduled appointment with provider: 11/19/2020 to see Dr. Nicholas Lose with treatment only on 11/12/2020.  Assessment: Plan:    Malignant neoplasm of lower-outer quadrant of right breast of female, estrogen receptor positive ([place])   ER positive malignant neoplasm of the right breast: Claudia Ortiz is followed by Dr. Nicholas Lose and presents to the clinic today for cycle 1, day 1 of paclitaxel and Herceptin.  She is scheduled to return for treatment only on 11/12/2020 and will be seen by Dr. Nicholas Lose on 11/19/2020.  She is scheduled to have an MRI of her breast at [facility] on 11/20/2020 at 830 and will be seen by her surgeon Dr. Donne Hazel on 11/27/2020 at 930.  Please see After Visit Summary for patient specific instructions.  Future Appointments  Date Time Provider [place]  11/05/2020  1:00 PM Harle Stanford., PA-C [facility] None  11/05/2020  2:00 PM [facility] INFUSION [facility] None  11/12/2020  7:45 AM [facility] [place] FLUSH [facility] None  11/12/2020  8:30 AM [facility] INFUSION [facility] None  11/19/2020  8:00 AM [facility] INFUSION [facility] None  11/19/2020  8:30 AM Nicholas Lose, MD [facility] None  11/19/2020  9:30 AM [facility] INFUSION [facility] None  11/20/2020  9:00 AM [facility] 1 [facility] [place]    No orders of the defined types were placed in this encounter.      Subjective:   Patient ID:  Claudia Ortiz is a 80 y.o. (DOB January 01, 1941) female.  Chief Complaint: No chief complaint on file.   HPI Claudia Ortiz is a 80 y.o. female with a diagnosis of an ER positive malignant neoplasm of the right breast.  She is followed by Dr. Nicholas Lose and  presents to the clinic today for cycle 1, day 1 of paclitaxel and Herceptin. She is scheduled to have an MRI of her breast at [facility] on 11/20/2020 at 830 and will be seen by her surgeon Dr. Donne Hazel on 11/27/2020 at 930. She reports mild lightheadedness and has has daily mild nosebleeds. She denies nausea, vomiting, constipation, diarrhea, fever, chills, or sweats.  Medications: I have reviewed the patient's current medications.  Allergies:  Allergies  Allergen Reactions  . Cephalosporins   . Iodine   . Penicillins     Past Medical History:  Diagnosis Date  . Anxiety   . Asthma    As Child  . Colon polyp   . Elevated cholesterol   . Intraductal papilloma of left breast     precancerous cells in R breast    Past Surgical History:  Procedure Laterality Date  . CYST REMOVAL TRUNK     breast   . FOOT SURGERY    . PORTACATH PLACEMENT Left 09/02/2020   Procedure: INSERTION PORT-A-CATH;  Surgeon: Rolm Bookbinder, MD;  Location: [place];  Service: General;  Laterality: Left;    Family History  Problem Relation Age of Onset  . Diabetes Mother   . Heart attack Mother   . Stroke Father   . Heart disease Father   . Heart attack Brother     Social History   Socioeconomic History  . Marital status: Married    Spouse name: Not on file  . Number of children: Not on file  .  Years of education: Not on file  . Highest education level: Not on file  Occupational History  . Not on file  Tobacco Use  . Smoking status: Never Smoker  . Smokeless tobacco: Never Used  Substance and Sexual Activity  . Alcohol use: No  . Drug use: No  . Sexual activity: Not on file  Other Topics Concern  . Not on file  Social History Narrative  . Not on file   Social Determinants of Health   Financial Resource Strain: Not on file  Food Insecurity: Not on file  Transportation Needs: Not on file  Physical Activity: Not on file  Stress: Not on file  Social Connections:  Not on file  Intimate Partner Violence: Not on file    Past Medical History, Surgical history, Social history, and Family history were reviewed and updated as appropriate.   Please see review of systems for further details on the patient's review from today.   Review of Systems:  Review of Systems  Constitutional: Negative for chills, diaphoresis and fever.  HENT: Positive for nosebleeds. Negative for trouble swallowing and voice change.   Respiratory: Negative for cough, chest tightness, shortness of breath and wheezing.   Cardiovascular: Negative for chest pain and palpitations.  Gastrointestinal: Negative for abdominal pain, constipation, diarrhea, nausea and vomiting.  Musculoskeletal: Negative for back pain and myalgias.  Neurological: Positive for light-headedness. Negative for dizziness and headaches.    Objective:   Physical Exam:  There were no vitals taken for this visit. ECOG: 0  Physical Exam Constitutional:      General: She is not in acute distress.    Appearance: She is not diaphoretic.  HENT:     Head: Normocephalic and atraumatic.  Eyes:     General: No scleral icterus.       Right eye: No discharge.        Left eye: No discharge.     Conjunctiva/sclera: Conjunctivae normal.  Cardiovascular:     Rate and Rhythm: Normal rate and regular rhythm.     Heart sounds: Normal heart sounds. No murmur heard. No friction rub. No gallop.   Pulmonary:     Effort: Pulmonary effort is normal. No respiratory distress.     Breath sounds: Normal breath sounds. No wheezing or rales.  Skin:    General: Skin is warm and dry.     Findings: No erythema or rash.  Neurological:     Mental Status: She is alert.     Coordination: Coordination normal.     Gait: Gait normal.  Psychiatric:        Mood and Affect: Mood normal.        Behavior: Behavior normal.        Thought Content: Thought content normal.        Judgment: Judgment normal.     Lab Review:     Component  Value Date/Time   NA 142 10/29/2020 1254   NA 144 07/03/2009 1412   K 4.0 10/29/2020 1254   K 3.7 07/03/2009 1412   CL 107 10/29/2020 1254   CL 104 07/03/2009 1412   CO2 25 10/29/2020 1254   CO2 31 07/03/2009 1412   GLUCOSE 96 10/29/2020 1254   GLUCOSE 119 (H) 07/03/2009 1412   BUN 13 10/29/2020 1254   BUN 16 07/03/2009 1412   CREATININE 0.82 10/29/2020 1254   CREATININE 0.7 07/03/2009 1412   CALCIUM 9.5 10/29/2020 1254   CALCIUM 9.5 07/03/2009 1412   PROT 6.7 10/29/2020  1254   PROT 7.0 07/03/2009 1412   ALBUMIN 3.6 10/29/2020 1254   ALBUMIN 3.5 07/03/2009 1412   AST 20 10/29/2020 1254   ALT 17 10/29/2020 1254   ALT 42 07/03/2009 1412   ALKPHOS 74 10/29/2020 1254   ALKPHOS 92 (H) 07/03/2009 1412   BILITOT 0.4 10/29/2020 1254   GFRNONAA >60 10/29/2020 1254   GFRAA  05/02/2008 1405    >60        The eGFR has been calculated using the MDRD equation. This calculation has not been validated in all clinical       Component Value Date/Time   WBC 5.0 10/29/2020 1254   WBC 5.3 05/02/2008 1405   RBC 4.09 10/29/2020 1254   HGB 11.4 (L) 10/29/2020 1254   HGB 12.6 07/03/2009 1412   HCT 35.1 (L) 10/29/2020 1254   HCT 39.5 07/03/2009 1412   PLT 258 10/29/2020 1254   PLT 199 07/03/2009 1412   MCV 85.8 10/29/2020 1254   MCV 84 07/03/2009 1412   MCH 27.9 10/29/2020 1254   MCHC 32.5 10/29/2020 1254   RDW 16.7 (H) 10/29/2020 1254   RDW 12.7 07/03/2009 1412   LYMPHSABS 1.7 10/29/2020 1254   LYMPHSABS 2.6 07/03/2009 1412   MONOABS 0.5 10/29/2020 1254   EOSABS 0.1 10/29/2020 1254   EOSABS 0.2 07/03/2009 1412   BASOSABS 0.1 10/29/2020 1254   BASOSABS 0.0 07/03/2009 1412   -------------------------------  Imaging from last 24 hours (if applicable):  Radiology interpretation: DG Chest 1 View  Result Date: 10/29/2020 CLINICAL DATA:  Patient for chemotherapy infusion today. Status post Port-A-Cath placement. EXAM: CHEST  1 VIEW COMPARISON:  None. FINDINGS: Left IJ approach  Port-A-Cath is in place with its tip projecting in the mid to lower superior vena cava. No pneumothorax. Tubing is intact. Lung volumes are low with basilar atelectasis. IMPRESSION: Port-A-Cath tip projects in the mid to lower superior vena cava. Negative for pneumothorax. Electronically Signed   By: Inge Rise M.D.   On: 10/29/2020 12:30

## 2020-11-05 NOTE — Progress Notes (Signed)
Per Tanner, PA okay to proceed with treatment today with last echo 07/25/20. Will reach out to Dr. Lindi Adie regarding next echo scheduling.

## 2020-11-12 ENCOUNTER — Other Ambulatory Visit: Payer: Medicare Other

## 2020-11-12 ENCOUNTER — Other Ambulatory Visit: Payer: Self-pay | Admitting: *Deleted

## 2020-11-12 ENCOUNTER — Inpatient Hospital Stay: Payer: Medicare Other

## 2020-11-12 ENCOUNTER — Other Ambulatory Visit: Payer: Self-pay

## 2020-11-12 VITALS — BP 130/76 | HR 82 | Temp 98.1°F | Resp 18 | Wt 189.5 lb

## 2020-11-12 DIAGNOSIS — Z95828 Presence of other vascular implants and grafts: Secondary | ICD-10-CM

## 2020-11-12 DIAGNOSIS — C50511 Malignant neoplasm of lower-outer quadrant of right female breast: Secondary | ICD-10-CM

## 2020-11-12 DIAGNOSIS — Z5112 Encounter for antineoplastic immunotherapy: Secondary | ICD-10-CM | POA: Diagnosis not present

## 2020-11-12 DIAGNOSIS — Z170 Estrogen receptor positive status [ER+]: Secondary | ICD-10-CM | POA: Diagnosis not present

## 2020-11-12 DIAGNOSIS — Z833 Family history of diabetes mellitus: Secondary | ICD-10-CM | POA: Diagnosis not present

## 2020-11-12 DIAGNOSIS — Z79899 Other long term (current) drug therapy: Secondary | ICD-10-CM | POA: Diagnosis not present

## 2020-11-12 DIAGNOSIS — Z5181 Encounter for therapeutic drug level monitoring: Secondary | ICD-10-CM

## 2020-11-12 DIAGNOSIS — Z8349 Family history of other endocrine, nutritional and metabolic diseases: Secondary | ICD-10-CM | POA: Diagnosis not present

## 2020-11-12 DIAGNOSIS — R42 Dizziness and giddiness: Secondary | ICD-10-CM | POA: Diagnosis not present

## 2020-11-12 DIAGNOSIS — Z823 Family history of stroke: Secondary | ICD-10-CM | POA: Diagnosis not present

## 2020-11-12 DIAGNOSIS — Z5111 Encounter for antineoplastic chemotherapy: Secondary | ICD-10-CM | POA: Diagnosis not present

## 2020-11-12 LAB — CBC WITH DIFFERENTIAL (CANCER CENTER ONLY)
Abs Immature Granulocytes: 0.04 10*3/uL (ref 0.00–0.07)
Basophils Absolute: 0.1 10*3/uL (ref 0.0–0.1)
Basophils Relative: 1 %
Eosinophils Absolute: 0.1 10*3/uL (ref 0.0–0.5)
Eosinophils Relative: 2 %
HCT: 33.7 % — ABNORMAL LOW (ref 36.0–46.0)
Hemoglobin: 10.8 g/dL — ABNORMAL LOW (ref 12.0–15.0)
Immature Granulocytes: 1 %
Lymphocytes Relative: 38 %
Lymphs Abs: 1.8 10*3/uL (ref 0.7–4.0)
MCH: 27.8 pg (ref 26.0–34.0)
MCHC: 32 g/dL (ref 30.0–36.0)
MCV: 86.6 fL (ref 80.0–100.0)
Monocytes Absolute: 0.5 10*3/uL (ref 0.1–1.0)
Monocytes Relative: 11 %
Neutro Abs: 2.2 10*3/uL (ref 1.7–7.7)
Neutrophils Relative %: 47 %
Platelet Count: 239 10*3/uL (ref 150–400)
RBC: 3.89 MIL/uL (ref 3.87–5.11)
RDW: 17.2 % — ABNORMAL HIGH (ref 11.5–15.5)
WBC Count: 4.7 10*3/uL (ref 4.0–10.5)
nRBC: 0 % (ref 0.0–0.2)

## 2020-11-12 LAB — CMP (CANCER CENTER ONLY)
ALT: 18 U/L (ref 0–44)
AST: 20 U/L (ref 15–41)
Albumin: 3.5 g/dL (ref 3.5–5.0)
Alkaline Phosphatase: 69 U/L (ref 38–126)
Anion gap: 8 (ref 5–15)
BUN: 12 mg/dL (ref 8–23)
CO2: 26 mmol/L (ref 22–32)
Calcium: 9.1 mg/dL (ref 8.9–10.3)
Chloride: 107 mmol/L (ref 98–111)
Creatinine: 0.82 mg/dL (ref 0.44–1.00)
GFR, Estimated: >60 mL/min (ref >60)
Glucose, Bld: 110 mg/dL — ABNORMAL HIGH (ref 70–99)
Potassium: 4.1 mmol/L (ref 3.5–5.1)
Sodium: 141 mmol/L (ref 135–145)
Total Bilirubin: 0.5 mg/dL (ref 0.3–1.2)
Total Protein: 6.3 g/dL — ABNORMAL LOW (ref 6.5–8.1)

## 2020-11-12 MED ORDER — TRASTUZUMAB-ANNS CHEMO 150 MG IV SOLR
2.0000 mg/kg | Freq: Once | INTRAVENOUS | Status: AC
  Administered 2020-11-12: 189 mg via INTRAVENOUS
  Filled 2020-11-12: qty 9

## 2020-11-12 MED ORDER — DIPHENHYDRAMINE HCL 50 MG/ML IJ SOLN
INTRAMUSCULAR | Status: AC
  Filled 2020-11-12: qty 1

## 2020-11-12 MED ORDER — SODIUM CHLORIDE 0.9 % IV SOLN
Freq: Once | INTRAVENOUS | Status: AC
Start: 2020-11-12 — End: 2020-11-12
  Filled 2020-11-12: qty 250

## 2020-11-12 MED ORDER — ACETAMINOPHEN 325 MG PO TABS
ORAL_TABLET | ORAL | Status: AC
  Filled 2020-11-12: qty 2

## 2020-11-12 MED ORDER — HEPARIN SOD (PORK) LOCK FLUSH 100 UNIT/ML IV SOLN
500.0000 [IU] | Freq: Once | INTRAVENOUS | Status: AC | PRN
  Administered 2020-11-12: 500 [IU]
  Filled 2020-11-12: qty 5

## 2020-11-12 MED ORDER — ACETAMINOPHEN 325 MG PO TABS
650.0000 mg | ORAL_TABLET | Freq: Once | ORAL | Status: AC
Start: 2020-11-12 — End: 2020-11-12
  Administered 2020-11-12: 650 mg via ORAL

## 2020-11-12 MED ORDER — SODIUM CHLORIDE 0.9% FLUSH
10.0000 mL | INTRAVENOUS | Status: DC | PRN
  Administered 2020-11-12: 10 mL
  Filled 2020-11-12: qty 10

## 2020-11-12 MED ORDER — SODIUM CHLORIDE 0.9% FLUSH
10.0000 mL | Freq: Once | INTRAVENOUS | Status: AC
  Administered 2020-11-12: 10 mL
  Filled 2020-11-12: qty 10

## 2020-11-12 MED ORDER — FAMOTIDINE IN NACL 20-0.9 MG/50ML-% IV SOLN
INTRAVENOUS | Status: AC
  Filled 2020-11-12: qty 50

## 2020-11-12 MED ORDER — SODIUM CHLORIDE 0.9 % IV SOLN
10.0000 mg | Freq: Once | INTRAVENOUS | Status: AC
  Administered 2020-11-12: 10 mg via INTRAVENOUS
  Filled 2020-11-12: qty 10

## 2020-11-12 MED ORDER — DIPHENHYDRAMINE HCL 50 MG/ML IJ SOLN
25.0000 mg | Freq: Once | INTRAMUSCULAR | Status: AC
Start: 2020-11-12 — End: 2020-11-12
  Administered 2020-11-12: 25 mg via INTRAVENOUS

## 2020-11-12 MED ORDER — SODIUM CHLORIDE 0.9 % IV SOLN
50.0000 mg/m2 | Freq: Once | INTRAVENOUS | Status: AC
  Administered 2020-11-12: 102 mg via INTRAVENOUS
  Filled 2020-11-12: qty 17

## 2020-11-12 MED ORDER — FAMOTIDINE IN NACL 20-0.9 MG/50ML-% IV SOLN
20.0000 mg | Freq: Once | INTRAVENOUS | Status: AC
  Administered 2020-11-12: 20 mg via INTRAVENOUS

## 2020-11-12 NOTE — Progress Notes (Signed)
Ok to treat with echo from 07/25/20 per Nicholas Lose, MD.

## 2020-11-12 NOTE — Patient Instructions (Signed)
[facility] MEDICAL [facility]COLOG[facility]   Discharge Instructions: Thank you for choosing [facility] to provide your oncology and hematology care.   If you have a lab appointment with the Cancer Center, please go directly to the Cancer Center and check in at the registration area.   Wear comfortable clothing and clothing appropriate for easy access to any Portacath or PICC line.   We strive to give you quality time with your provider. You may need to reschedule your appointment if you arrive late (15 or more minutes).  Arriving late affects you and other patients whose appointments are after yours.  Also, if you miss three or more appointments without notifying the office, you may be dismissed from the clinic at the provider's discretion.      For prescription refill requests, have your pharmacy contact our office and allow 72 hours for refills to be completed.    Today you received the following chemotherapy and/or immunotherapy agents: trastuzumab and paclitaxel.      To help prevent nausea and vomiting after your treatment, we encourage you to take your nausea medication as directed.  BELOW ARE SYMPTOMS THAT SHOULD BE REPORTED IMMEDIATELY: *FEVER GREATER THAN 100.4 F (38 C) OR HIGHER *CHILLS OR SWEATING *NAUSEA AND VOMITING THAT IS NOT CONTROLLED WITH YOUR NAUSEA MEDICATION *UNUSUAL SHORTNESS OF BREATH *UNUSUAL BRUISING OR BLEEDING *URINARY PROBLEMS (pain or burning when urinating, or frequent urination) *BOWEL PROBLEMS (unusual diarrhea, constipation, pain near the anus) TENDERNESS IN MOUTH AND THROAT WITH OR WITHOUT PRESENCE OF ULCERS (sore throat, sores in mouth, or a toothache) UNUSUAL RASH, SWELLING OR PAIN  UNUSUAL VAGINAL DISCHARGE OR ITCHING   Items with * indicate a potential emergency and should be followed up as soon as possible or go to the Emergency Department if any problems should occur.  Please show the CHEMOTHERAPY ALERT CARD or IMMUNOTHERAPY ALERT  CARD at check-in to the Emergency Department and triage nurse.  Should you have questions after your visit or need to cancel or reschedule your appointment, please contact [facility]  Dept: [phone]  and follow the prompts.  Office hours are 8:00 a.m. to 4:30 p.m. Monday - Friday. Please note that voicemails left after 4:00 p.m. may not be returned until the following business day.  We are closed weekends and major holidays. You have access to a nurse at all times for urgent questions. Please call the main number to the clinic Dept: [phone] and follow the prompts.   For any non-urgent questions, you may also contact your provider using MyChart. We now offer e-Visits for anyone 18 and older to request care online for non-urgent symptoms. For details visit [email or web address].   Also download the MyChart app! Go to the app store, search "MyChart", open the app, select [facility], and log in with your MyChart username and password.  Due to Covid, a mask is required upon entering the hospital/clinic. If you do not have a mask, one will be given to you upon arrival. For doctor visits, patients may have 1 support person aged 18 or older with them. For treatment visits, patients cannot have anyone with them due to current Covid guidelines and our immunocompromised population.   

## 2020-11-18 NOTE — Assessment & Plan Note (Signed)
07/14/2020:Screening mammogram showed a 1.5cm right breast mass. US showed the 1.4cm mass at the 7 o'clock position, a 2.7cm mass at the 9 o'clock position, and benign right axillary lymph nodes. Biopsy showed ALH at the 9 o'clock position, and at the 7 o'clock position, IDC, grade 3, HER-2 positive (3+), ER+ 20% weak, PR- 0%, Ki67 40%.  Treatment Plan: 1. Neoadjuvant chemotherapy withTaxol Herceptinfollowed by Herceptin maintenance for 1 year 2. Followed by breast conserving surgery if possible with sentinel lymph node study 3. Followed by adjuvant radiation therapy if patient had lumpectomy 4.Followed by adjuvant antiestrogen therapy ------------------------------------------------------------------------------------------------------------------------------- Current Treatment: Cycle10Taxol Herceptin ECHO 07/25/20: EF 55-60%  Chemo Toxicities:  Mild nosebleeds: In spite of using humidifier and moisturizers to the nose.  We discussed about ENT referral if it continues to persist. Monitoring closely for neuropathy.  She wants to try naturopathic/complementary treatments alongside her chemo. RTCweekly for chemo and every other week for follow up with me.

## 2020-11-18 NOTE — Progress Notes (Signed)
Patient Care Team: Kelton Pillar, MD as PCP - General (Family Medicine) Rockwell Germany, RN as Nurse Navigator Tressie Ellis, Paulette Blanch, RN as Oncology Nurse Navigator Rolm Bookbinder, MD as Consulting Physician (General Surgery) Nicholas Lose, MD as Consulting Physician (Hematology and Oncology) Kyung Rudd, MD as Consulting Physician (Radiation Oncology)  DIAGNOSIS:    ICD-10-CM   1. Malignant neoplasm of lower-outer quadrant of right breast of female, estrogen receptor positive ([place])  C50.511    Z17.0     SUMMARY OF ONCOLOGIC HISTORY: Oncology History  Malignant neoplasm of lower-outer quadrant of right breast of female, estrogen receptor positive ([place])  07/14/2020 Initial Diagnosis   Screening mammogram showed a 1.5cm right breast mass. US showed the 1.4cm mass at the 7 o'clock position, a 2.7cm mass at the 9 o'clock position, and benign right axillary lymph nodes. Biopsy showed ALH at the 9 o'clock position, and at the 7 o'clock position, IDC, grade 3, HER-2 positive (3+), ER+ 20% weak, PR- 0%, Ki67 40%.   09/03/2020 -  Chemotherapy    Patient is on Treatment Plan: BREAST PACLITAXEL / TRASTUZUMAB Q28D        CHIEF COMPLIANT: Cycle12Taxol Herceptin  INTERVAL HISTORY: Claudia Ortiz is a 80 y.o. with above-mentioned history of right breast cancer currently on neoadjuvant chemotherapy withTaxol Herceptin. She presents today for cycle12.   Overall she has tolerated chemotherapy extremely well.  Does not have any nausea or vomiting.  Does have fatigue.  ALLERGIES:  is allergic to cephalosporins, iodine, and penicillins.  MEDICATIONS:  Current Outpatient Medications  Medication Sig Dispense Refill  . b complex vitamins capsule Take 1 capsule by mouth daily.    . Garlic 10 MG CAPS Take 1 capsule by mouth daily.    Marland Kitchen lidocaine-prilocaine (EMLA) cream Apply to affected area once 30 g 3  . Multiple Vitamin (MULTIVITAMIN) tablet Take 1 tablet by mouth daily.    . ondansetron  (ZOFRAN) 8 MG tablet Take 1 tablet (8 mg total) by mouth 2 (two) times daily as needed (Nausea or vomiting). 30 tablet 1  . prochlorperazine (COMPAZINE) 10 MG tablet Take 1 tablet (10 mg total) by mouth every 6 (six) hours as needed (Nausea or vomiting). 30 tablet 1  . Vitamin A 7.5 MG (25000 UT) CAPS Take 1 capsule by mouth daily.    . vitamin D, CHOLECALCIFEROL, 400 UNITS tablet Take 400 Units by mouth daily.    . Zinc Oxide-Vitamin C (ZINC PLUS VITAMIN C PO) Take 1 capsule by mouth daily.     No current facility-administered medications for this visit.    PHYSICAL EXAMINATION: ECOG PERFORMANCE STATUS: 1 - Symptomatic but completely ambulatory  There were no vitals filed for this visit. There were no vitals filed for this visit.  LABORATORY DATA:  I have reviewed the data as listed CMP Latest Ref Rng & Units 11/12/2020 11/05/2020 10/29/2020  Glucose 70 - 99 mg/dL 110(H) 100(H) 96  BUN 8 - 23 mg/dL $Remove'12 12 13  'aQeNZss$ Creatinine 0.44 - 1.00 mg/dL 0.82 0.77 0.82  Sodium 135 - 145 mmol/L 141 141 142  Potassium 3.5 - 5.1 mmol/L 4.1 4.0 4.0  Chloride 98 - 111 mmol/L 107 108 107  CO2 22 - 32 mmol/L $RemoveB'26 25 25  'EhYwhbyt$ Calcium 8.9 - 10.3 mg/dL 9.1 8.9 9.5  Total Protein 6.5 - 8.1 g/dL 6.3(L) 6.2(L) 6.7  Total Bilirubin 0.3 - 1.2 mg/dL 0.5 0.4 0.4  Alkaline Phos 38 - 126 U/L 69 77 74  AST 15 - 41  U/L '20 20 20  '$ ALT 0 - 44 U/L $Remo'18 19 17    'mSAgD$ Lab Results  Component Value Date   WBC 4.7 11/12/2020   HGB 10.8 (L) 11/12/2020   HCT 33.7 (L) 11/12/2020   MCV 86.6 11/12/2020   PLT 239 11/12/2020   NEUTROABS 2.2 11/12/2020    ASSESSMENT & PLAN:  Malignant neoplasm of lower-outer quadrant of right breast of female, estrogen receptor positive ([place]) 07/14/2020:Screening mammogram showed a 1.5cm right breast mass. US showed the 1.4cm mass at the 7 o'clock position, a 2.7cm mass at the 9 o'clock position, and benign right axillary lymph nodes. Biopsy showed ALH at the 9 o'clock position, and at the 7 o'clock position,  IDC, grade 3, HER-2 positive (3+), ER+ 20% weak, PR- 0%, Ki67 40%.  Treatment Plan: 1. Neoadjuvant chemotherapy withTaxol Herceptinfollowed by Herceptin maintenance for 1 year 2. Followed by breast conserving surgery if possible with sentinel lymph node study 3. Followed by adjuvant radiation therapy if patient had lumpectomy 4.Followed by adjuvant antiestrogen therapy ------------------------------------------------------------------------------------------------------------------------------- Current Treatment: Cycle12Taxol Herceptin ECHO 07/25/20: EF 55-60%  Chemo Toxicities:  Mild nosebleeds:   She wants to try naturopathic/complementary treatments alongside her chemo. I discussed with her about the every 3-week Herceptin maintenance treatment. We will set her up in 3 weeks for next dose of Herceptin.  She has appointments for breast MRI and surgery follow-up. Follow-up 1 week after surgery to discuss the final pathology report.    No orders of the defined types were placed in this encounter.  The patient has a good understanding of the overall plan. she agrees with it. she will call with any problems that may develop before the next visit here.  Total time spent: 30 mins including face to face time and time spent for planning, charting and coordination of care  Rulon Eisenmenger, MD, MPH 11/19/2020  I, Cloyde Reams Dorshimer, am acting as scribe for Dr. Nicholas Lose.  I have reviewed the above documentation for accuracy and completeness, and I agree with the above.

## 2020-11-19 ENCOUNTER — Other Ambulatory Visit: Payer: Self-pay

## 2020-11-19 ENCOUNTER — Encounter: Payer: Self-pay | Admitting: *Deleted

## 2020-11-19 ENCOUNTER — Other Ambulatory Visit: Payer: Medicare Other

## 2020-11-19 ENCOUNTER — Inpatient Hospital Stay: Payer: Medicare Other | Attending: Hematology and Oncology

## 2020-11-19 ENCOUNTER — Inpatient Hospital Stay: Payer: Medicare Other | Admitting: Hematology and Oncology

## 2020-11-19 ENCOUNTER — Inpatient Hospital Stay: Payer: Medicare Other

## 2020-11-19 DIAGNOSIS — Z881 Allergy status to other antibiotic agents status: Secondary | ICD-10-CM | POA: Diagnosis not present

## 2020-11-19 DIAGNOSIS — C50511 Malignant neoplasm of lower-outer quadrant of right female breast: Secondary | ICD-10-CM

## 2020-11-19 DIAGNOSIS — Z888 Allergy status to other drugs, medicaments and biological substances status: Secondary | ICD-10-CM | POA: Diagnosis not present

## 2020-11-19 DIAGNOSIS — Z5111 Encounter for antineoplastic chemotherapy: Secondary | ICD-10-CM | POA: Insufficient documentation

## 2020-11-19 DIAGNOSIS — Z880 Allergy status to penicillin: Secondary | ICD-10-CM | POA: Diagnosis not present

## 2020-11-19 DIAGNOSIS — Z170 Estrogen receptor positive status [ER+]: Secondary | ICD-10-CM | POA: Diagnosis not present

## 2020-11-19 DIAGNOSIS — Z5112 Encounter for antineoplastic immunotherapy: Secondary | ICD-10-CM | POA: Insufficient documentation

## 2020-11-19 DIAGNOSIS — R5383 Other fatigue: Secondary | ICD-10-CM | POA: Diagnosis not present

## 2020-11-19 DIAGNOSIS — Z95828 Presence of other vascular implants and grafts: Secondary | ICD-10-CM

## 2020-11-19 DIAGNOSIS — Z79899 Other long term (current) drug therapy: Secondary | ICD-10-CM | POA: Diagnosis not present

## 2020-11-19 LAB — CBC WITH DIFFERENTIAL (CANCER CENTER ONLY)
Abs Immature Granulocytes: 0.07 10*3/uL (ref 0.00–0.07)
Basophils Absolute: 0.1 10*3/uL (ref 0.0–0.1)
Basophils Relative: 1 %
Eosinophils Absolute: 0.1 10*3/uL (ref 0.0–0.5)
Eosinophils Relative: 1 %
HCT: 33.8 % — ABNORMAL LOW (ref 36.0–46.0)
Hemoglobin: 10.8 g/dL — ABNORMAL LOW (ref 12.0–15.0)
Immature Granulocytes: 1 %
Lymphocytes Relative: 37 %
Lymphs Abs: 1.9 10*3/uL (ref 0.7–4.0)
MCH: 28.1 pg (ref 26.0–34.0)
MCHC: 32 g/dL (ref 30.0–36.0)
MCV: 88 fL (ref 80.0–100.0)
Monocytes Absolute: 0.5 10*3/uL (ref 0.1–1.0)
Monocytes Relative: 9 %
Neutro Abs: 2.6 10*3/uL (ref 1.7–7.7)
Neutrophils Relative %: 51 %
Platelet Count: 228 10*3/uL (ref 150–400)
RBC: 3.84 MIL/uL — ABNORMAL LOW (ref 3.87–5.11)
RDW: 17.1 % — ABNORMAL HIGH (ref 11.5–15.5)
WBC Count: 5.2 10*3/uL (ref 4.0–10.5)
nRBC: 0 % (ref 0.0–0.2)

## 2020-11-19 LAB — CMP (CANCER CENTER ONLY)
ALT: 18 U/L (ref 0–44)
AST: 22 U/L (ref 15–41)
Albumin: 3.5 g/dL (ref 3.5–5.0)
Alkaline Phosphatase: 74 U/L (ref 38–126)
Anion gap: 8 (ref 5–15)
BUN: 16 mg/dL (ref 8–23)
CO2: 26 mmol/L (ref 22–32)
Calcium: 9.2 mg/dL (ref 8.9–10.3)
Chloride: 109 mmol/L (ref 98–111)
Creatinine: 0.89 mg/dL (ref 0.44–1.00)
GFR, Estimated: >60 mL/min (ref >60)
Glucose, Bld: 114 mg/dL — ABNORMAL HIGH (ref 70–99)
Potassium: 4 mmol/L (ref 3.5–5.1)
Sodium: 143 mmol/L (ref 135–145)
Total Bilirubin: 0.4 mg/dL (ref 0.3–1.2)
Total Protein: 6.3 g/dL — ABNORMAL LOW (ref 6.5–8.1)

## 2020-11-19 MED ORDER — TRASTUZUMAB-ANNS CHEMO 150 MG IV SOLR
6.0000 mg/kg | Freq: Once | INTRAVENOUS | Status: AC
  Administered 2020-11-19: 546 mg via INTRAVENOUS
  Filled 2020-11-19: qty 26

## 2020-11-19 MED ORDER — SODIUM CHLORIDE 0.9% FLUSH
10.0000 mL | INTRAVENOUS | Status: DC | PRN
Start: 2020-11-19 — End: 2020-11-19
  Administered 2020-11-19: 10 mL
  Filled 2020-11-19: qty 10

## 2020-11-19 MED ORDER — ACETAMINOPHEN 325 MG PO TABS
650.0000 mg | ORAL_TABLET | Freq: Once | ORAL | Status: AC
  Administered 2020-11-19: 650 mg via ORAL

## 2020-11-19 MED ORDER — SODIUM CHLORIDE 0.9 % IV SOLN
10.0000 mg | Freq: Once | INTRAVENOUS | Status: AC
  Administered 2020-11-19: 10 mg via INTRAVENOUS
  Filled 2020-11-19: qty 10

## 2020-11-19 MED ORDER — FAMOTIDINE 20 MG IN NS 100 ML IVPB
20.0000 mg | Freq: Once | INTRAVENOUS | Status: AC
Start: 2020-11-19 — End: 2020-11-19
  Administered 2020-11-19: 20 mg via INTRAVENOUS

## 2020-11-19 MED ORDER — DIPHENHYDRAMINE HCL 50 MG/ML IJ SOLN
INTRAMUSCULAR | Status: AC
  Filled 2020-11-19: qty 1

## 2020-11-19 MED ORDER — SODIUM CHLORIDE 0.9 % IV SOLN
Freq: Once | INTRAVENOUS | Status: AC
Start: 2020-11-19 — End: 2020-11-19
  Filled 2020-11-19: qty 250

## 2020-11-19 MED ORDER — PACLITAXEL CHEMO INJECTION 300 MG/50ML
50.0000 mg/m2 | Freq: Once | INTRAVENOUS | Status: AC
  Administered 2020-11-19: 102 mg via INTRAVENOUS
  Filled 2020-11-19: qty 17

## 2020-11-19 MED ORDER — DIPHENHYDRAMINE HCL 50 MG/ML IJ SOLN
25.0000 mg | Freq: Once | INTRAMUSCULAR | Status: AC
  Administered 2020-11-19: 25 mg via INTRAVENOUS

## 2020-11-19 MED ORDER — ACETAMINOPHEN 325 MG PO TABS
ORAL_TABLET | ORAL | Status: AC
  Filled 2020-11-19: qty 2

## 2020-11-19 MED ORDER — FAMOTIDINE 20 MG IN NS 100 ML IVPB
INTRAVENOUS | Status: AC
  Filled 2020-11-19: qty 100

## 2020-11-19 MED ORDER — SODIUM CHLORIDE 0.9% FLUSH
10.0000 mL | Freq: Once | INTRAVENOUS | Status: AC
  Administered 2020-11-19: 10 mL
  Filled 2020-11-19: qty 10

## 2020-11-19 MED ORDER — HEPARIN SOD (PORK) LOCK FLUSH 100 UNIT/ML IV SOLN
500.0000 [IU] | Freq: Once | INTRAVENOUS | Status: AC | PRN
  Administered 2020-11-19: 500 [IU]
  Filled 2020-11-19: qty 5

## 2020-11-19 NOTE — Progress Notes (Signed)
Increase today's dose of trastuzumab to 6 mg/kg as start of 3 week dosing.  Dose updated.  T.O. Dr Jannifer Hick, PharmD 11/19/2020

## 2020-11-19 NOTE — Patient Instructions (Signed)
[facility]  Discharge Instructions: Thank you for choosing [facility] to provide your oncology and hematology care.   If you have a lab appointment with the [facility], please go directly to the [facility] and check in at the registration area.   Wear comfortable clothing and clothing appropriate for easy access to any Portacath or PICC line.   We strive to give you quality time with your provider. You may need to reschedule your appointment if you arrive late (15 or more minutes).  Arriving late affects you and other patients whose appointments are after yours.  Also, if you miss three or more appointments without notifying the office, you may be dismissed from the clinic at the provider's discretion.      For prescription refill requests, have your pharmacy contact our office and allow 72 hours for refills to be completed.    Today you received the following chemotherapy and/or immunotherapy agents: Trastzumab, Taxol      To help prevent nausea and vomiting after your treatment, we encourage you to take your nausea medication as directed.  BELOW ARE SYMPTOMS THAT SHOULD BE REPORTED IMMEDIATELY: . *FEVER GREATER THAN 100.4 F (38 C) OR HIGHER . *CHILLS OR SWEATING . *NAUSEA AND VOMITING THAT IS NOT CONTROLLED WITH YOUR NAUSEA MEDICATION . *UNUSUAL SHORTNESS OF BREATH . *UNUSUAL BRUISING OR BLEEDING . *URINARY PROBLEMS (pain or burning when urinating, or frequent urination) . *BOWEL PROBLEMS (unusual diarrhea, constipation, pain near the anus) . TENDERNESS IN MOUTH AND THROAT WITH OR WITHOUT PRESENCE OF ULCERS (sore throat, sores in mouth, or a toothache) . UNUSUAL RASH, SWELLING OR PAIN  . UNUSUAL VAGINAL DISCHARGE OR ITCHING   Items with * indicate a potential emergency and should be followed up as soon as possible or go to the Emergency Department if any problems should occur.  Please show the CHEMOTHERAPY ALERT CARD or  IMMUNOTHERAPY ALERT CARD at check-in to the Emergency Department and triage nurse.  Should you have questions after your visit or need to cancel or reschedule your appointment, please contact [facility]  Dept: [phone]  and follow the prompts.  Office hours are 8:00 a.m. to 4:30 p.m. Monday - Friday. Please note that voicemails left after 4:00 p.m. may not be returned until the following business day.  We are closed weekends and major holidays. You have access to a nurse at all times for urgent questions. Please call the main number to the clinic Dept: [phone] and follow the prompts.   For any non-urgent questions, you may also contact your provider using MyChart. We now offer e-Visits for anyone 22 and older to request care online for non-urgent symptoms. For details visit [email or web address].   Also download the MyChart app! Go to the app store, search "MyChart", open the app, select [facility], and log in with your MyChart username and password.  Due to Covid, a mask is required upon entering the hospital/clinic. If you do not have a mask, one will be given to you upon arrival. For doctor visits, patients may have 1 support person aged 60 or older with them. For treatment visits, patients cannot have anyone with them due to current Covid guidelines and our immunocompromised population.

## 2020-11-20 ENCOUNTER — Ambulatory Visit (HOSPITAL_COMMUNITY)
Admission: RE | Admit: 2020-11-20 | Discharge: 2020-11-20 | Disposition: A | Discharge status: Home / Self Care | Payer: Medicare Other | Source: Ambulatory Visit | Attending: Hematology and Oncology | Admitting: Hematology and Oncology

## 2020-11-20 DIAGNOSIS — Z170 Estrogen receptor positive status [ER+]: Secondary | ICD-10-CM | POA: Diagnosis not present

## 2020-11-20 DIAGNOSIS — C50911 Malignant neoplasm of unspecified site of right female breast: Secondary | ICD-10-CM | POA: Diagnosis not present

## 2020-11-20 DIAGNOSIS — C50511 Malignant neoplasm of lower-outer quadrant of right female breast: Secondary | ICD-10-CM | POA: Insufficient documentation

## 2020-11-20 MED ORDER — GADOBUTROL 1 MMOL/ML IV SOLN
8.0000 mL | Freq: Once | INTRAVENOUS | Status: AC | PRN
  Administered 2020-11-20: 8 mL via INTRAVENOUS

## 2020-11-21 ENCOUNTER — Ambulatory Visit (HOSPITAL_COMMUNITY)
Admission: RE | Admit: 2020-11-21 | Discharge: 2020-11-21 | Disposition: A | Discharge status: Home / Self Care | Payer: Medicare Other | Source: Ambulatory Visit | Attending: Hematology and Oncology | Admitting: Hematology and Oncology

## 2020-11-21 ENCOUNTER — Other Ambulatory Visit: Payer: Self-pay

## 2020-11-21 DIAGNOSIS — I371 Nonrheumatic pulmonary valve insufficiency: Secondary | ICD-10-CM | POA: Insufficient documentation

## 2020-11-21 DIAGNOSIS — Z0189 Encounter for other specified special examinations: Secondary | ICD-10-CM | POA: Diagnosis not present

## 2020-11-21 DIAGNOSIS — Z170 Estrogen receptor positive status [ER+]: Secondary | ICD-10-CM

## 2020-11-21 DIAGNOSIS — C50511 Malignant neoplasm of lower-outer quadrant of right female breast: Secondary | ICD-10-CM | POA: Diagnosis not present

## 2020-11-21 DIAGNOSIS — E785 Hyperlipidemia, unspecified: Secondary | ICD-10-CM | POA: Diagnosis not present

## 2020-11-21 DIAGNOSIS — Z79899 Other long term (current) drug therapy: Secondary | ICD-10-CM

## 2020-11-21 DIAGNOSIS — I071 Rheumatic tricuspid insufficiency: Secondary | ICD-10-CM | POA: Insufficient documentation

## 2020-11-21 DIAGNOSIS — Z5181 Encounter for therapeutic drug level monitoring: Secondary | ICD-10-CM

## 2020-11-21 LAB — ECHOCARDIOGRAM COMPLETE
Area-P 1/2: 4.6 cm2
S' Lateral: 3.7 cm

## 2020-11-21 NOTE — Progress Notes (Signed)
  Echocardiogram 2D Echocardiogram has been performed.  Mareon Robinette G Brayley Mackowiak 11/21/2020, 10:13 AM

## 2020-11-27 DIAGNOSIS — C50511 Malignant neoplasm of lower-outer quadrant of right female breast: Secondary | ICD-10-CM | POA: Diagnosis not present

## 2020-11-28 ENCOUNTER — Telehealth: Payer: Self-pay | Admitting: Hematology and Oncology

## 2020-11-28 ENCOUNTER — Encounter: Payer: Self-pay | Admitting: *Deleted

## 2020-11-28 ENCOUNTER — Telehealth: Payer: Self-pay | Admitting: *Deleted

## 2020-11-28 NOTE — Telephone Encounter (Signed)
Spoke with patient to answer questions she had regarding her appointments. Explained to her that the appointments going forward are for the herceptin every 3 weeks. She also had a question regarding a supplement that she ordered from [place] called Neuriva plus to help with focus, memory and concentration. Informed her I would need to check with Dr. Lindi Adie concerning this. Patient verbalized understanding and will wait on starting that until we have an answer from Dr. Lindi Adie.

## 2020-11-28 NOTE — Telephone Encounter (Signed)
Scheduled appt sper 5/13 sch msg. Pt aware. Pt was confused as to why more tx appts were being scheduled for her though. I sent a msg for the nurse to reach out to her when she got a chance.

## 2020-11-29 ENCOUNTER — Other Ambulatory Visit: Payer: Self-pay | Admitting: General Surgery

## 2020-11-29 DIAGNOSIS — C50511 Malignant neoplasm of lower-outer quadrant of right female breast: Secondary | ICD-10-CM

## 2020-11-29 DIAGNOSIS — Z170 Estrogen receptor positive status [ER+]: Secondary | ICD-10-CM

## 2020-12-09 ENCOUNTER — Inpatient Hospital Stay (HOSPITAL_BASED_OUTPATIENT_CLINIC_OR_DEPARTMENT_OTHER): Payer: Medicare Other | Admitting: Hematology and Oncology

## 2020-12-09 DIAGNOSIS — C50511 Malignant neoplasm of lower-outer quadrant of right female breast: Secondary | ICD-10-CM

## 2020-12-09 DIAGNOSIS — Z79899 Other long term (current) drug therapy: Secondary | ICD-10-CM | POA: Diagnosis not present

## 2020-12-09 DIAGNOSIS — Z170 Estrogen receptor positive status [ER+]: Secondary | ICD-10-CM

## 2020-12-09 DIAGNOSIS — Z880 Allergy status to penicillin: Secondary | ICD-10-CM | POA: Diagnosis not present

## 2020-12-09 DIAGNOSIS — Z888 Allergy status to other drugs, medicaments and biological substances status: Secondary | ICD-10-CM | POA: Diagnosis not present

## 2020-12-09 DIAGNOSIS — Z881 Allergy status to other antibiotic agents status: Secondary | ICD-10-CM | POA: Diagnosis not present

## 2020-12-09 DIAGNOSIS — R5383 Other fatigue: Secondary | ICD-10-CM | POA: Diagnosis not present

## 2020-12-09 DIAGNOSIS — Z5112 Encounter for antineoplastic immunotherapy: Secondary | ICD-10-CM | POA: Diagnosis not present

## 2020-12-09 DIAGNOSIS — Z5111 Encounter for antineoplastic chemotherapy: Secondary | ICD-10-CM | POA: Diagnosis not present

## 2020-12-09 NOTE — Progress Notes (Signed)
HEMATOLOGY-ONCOLOGY TELEPHONE VISIT PROGRESS NOTE  I connected with $RemoveBefore'@PTNAME'[email or web address]$ @ on 12/09/20 at  1:30 PM EDT by telephone and verified that I am speaking with the correct person using two identifiers.  I discussed the limitations, risks, security and privacy concerns of performing an evaluation and management service by telephone and the availability of in person appointments.  I also discussed with the patient that there may be a patient responsible charge related to this service. The patient expressed understanding and agreed to proceed.   History of Present Illness: Patient connected with me by telephone to discuss surgical plans as well as the role of Herceptin.  Oncology History  Malignant neoplasm of lower-outer quadrant of right breast of female, estrogen receptor positive ([place])  07/14/2020 Initial Diagnosis   Screening mammogram showed a 1.5cm right breast mass. US showed the 1.4cm mass at the 7 o'clock position, a 2.7cm mass at the 9 o'clock position, and benign right axillary lymph nodes. Biopsy showed ALH at the 9 o'clock position, and at the 7 o'clock position, IDC, grade 3, HER-2 positive (3+), ER+ 20% weak, PR- 0%, Ki67 40%.   09/03/2020 -  Chemotherapy    Patient is on Treatment Plan: BREAST PACLITAXEL / TRASTUZUMAB Q28D        REVIEW OF SYSTEMS:   Constitutional: Denies fevers, chills or abnormal weight loss   All other systems were reviewed with the patient and are negative. Observations/Objective:     Assessment Plan:  Malignant neoplasm of lower-outer quadrant of right breast of female, estrogen receptor positive ([place]) 07/14/2020:Screening mammogram showed a 1.5cm right breast mass. US showed the 1.4cm mass at the 7 o'clock position, a 2.7cm mass at the 9 o'clock position, and benign right axillary lymph nodes. Biopsy showed ALH at the 9 o'clock position, and at the 7 o'clock position, IDC, grade 3, HER-2 positive (3+), ER+ 20% weak, PR- 0%, Ki67 40%.  Treatment  Plan: 1. Neoadjuvant chemotherapy withTaxol Herceptinfollowed by Herceptin maintenance for 1 year 2. Followed by breast conserving surgery if possible with sentinel lymph node study 3. Followed by adjuvant radiation therapy if patient had lumpectomy 4.Followed by adjuvant antiestrogen therapy ------------------------------------------------------------------------------------------------------------------------------- Current treatment: Herceptin maintenance to start tomorrow Echocardiogram May 2022: EF 50 to 55%  Breast MRI: Motion defect. She had several questions about surgery including the role of radiation seeds.  Return to clinic every 3 weeks for Herceptin every 6 weeks to follow-up with me.    I discussed the assessment and treatment plan with the patient. The patient was provided an opportunity to ask questions and all were answered. The patient agreed with the plan and demonstrated an understanding of the instructions. The patient was advised to call back or seek an in-person evaluation if the symptoms worsen or if the condition fails to improve as anticipated.   I provided 30 minutes of non-face-to-face time during this encounter. Harriette Ohara, MD

## 2020-12-09 NOTE — Assessment & Plan Note (Signed)
07/14/2020:Screening mammogram showed a 1.5cm right breast mass. US showed the 1.4cm mass at the 7 o'clock position, a 2.7cm mass at the 9 o'clock position, and benign right axillary lymph nodes. Biopsy showed ALH at the 9 o'clock position, and at the 7 o'clock position, IDC, grade 3, HER-2 positive (3+), ER+ 20% weak, PR- 0%, Ki67 40%.  Treatment Plan: 1. Neoadjuvant chemotherapy withTaxol Herceptinfollowed by Herceptin maintenance for 1 year 2. Followed by breast conserving surgery if possible with sentinel lymph node study 3. Followed by adjuvant radiation therapy if patient had lumpectomy 4.Followed by adjuvant antiestrogen therapy ------------------------------------------------------------------------------------------------------------------------------- Current treatment: Herceptin maintenance to start tomorrow Echocardiogram May 2022: EF 50 to 55%  Breast MRI: Motion defect. She had several questions about surgery including the role of radiation seeds.  Return to clinic every 3 weeks for Herceptin every 6 weeks to follow-up with me.

## 2020-12-10 ENCOUNTER — Other Ambulatory Visit: Payer: Self-pay

## 2020-12-10 ENCOUNTER — Encounter: Payer: Self-pay | Admitting: *Deleted

## 2020-12-10 ENCOUNTER — Inpatient Hospital Stay: Payer: Medicare Other

## 2020-12-10 ENCOUNTER — Telehealth: Payer: Self-pay | Admitting: *Deleted

## 2020-12-10 ENCOUNTER — Other Ambulatory Visit: Payer: Self-pay | Admitting: Hematology and Oncology

## 2020-12-10 VITALS — BP 123/73 | HR 89 | Temp 99.2°F | Resp 16 | Wt 187.8 lb

## 2020-12-10 DIAGNOSIS — R5383 Other fatigue: Secondary | ICD-10-CM | POA: Diagnosis not present

## 2020-12-10 DIAGNOSIS — Z880 Allergy status to penicillin: Secondary | ICD-10-CM | POA: Diagnosis not present

## 2020-12-10 DIAGNOSIS — Z5111 Encounter for antineoplastic chemotherapy: Secondary | ICD-10-CM | POA: Diagnosis not present

## 2020-12-10 DIAGNOSIS — Z170 Estrogen receptor positive status [ER+]: Secondary | ICD-10-CM

## 2020-12-10 DIAGNOSIS — Z5112 Encounter for antineoplastic immunotherapy: Secondary | ICD-10-CM | POA: Diagnosis not present

## 2020-12-10 DIAGNOSIS — Z888 Allergy status to other drugs, medicaments and biological substances status: Secondary | ICD-10-CM | POA: Diagnosis not present

## 2020-12-10 DIAGNOSIS — Z79899 Other long term (current) drug therapy: Secondary | ICD-10-CM | POA: Diagnosis not present

## 2020-12-10 DIAGNOSIS — Z881 Allergy status to other antibiotic agents status: Secondary | ICD-10-CM | POA: Diagnosis not present

## 2020-12-10 DIAGNOSIS — C50511 Malignant neoplasm of lower-outer quadrant of right female breast: Secondary | ICD-10-CM | POA: Diagnosis not present

## 2020-12-10 MED ORDER — DIPHENHYDRAMINE HCL 25 MG PO CAPS
50.0000 mg | ORAL_CAPSULE | Freq: Once | ORAL | Status: AC
  Administered 2020-12-10: 25 mg via ORAL

## 2020-12-10 MED ORDER — HEPARIN SOD (PORK) LOCK FLUSH 100 UNIT/ML IV SOLN
500.0000 [IU] | Freq: Once | INTRAVENOUS | Status: AC | PRN
  Administered 2020-12-10: 500 [IU]
  Filled 2020-12-10: qty 5

## 2020-12-10 MED ORDER — SODIUM CHLORIDE 0.9 % IV SOLN
Freq: Once | INTRAVENOUS | Status: AC
  Filled 2020-12-10: qty 250

## 2020-12-10 MED ORDER — DIPHENHYDRAMINE HCL 25 MG PO CAPS
ORAL_CAPSULE | ORAL | Status: AC
  Filled 2020-12-10: qty 2

## 2020-12-10 MED ORDER — SODIUM CHLORIDE 0.9% FLUSH
10.0000 mL | INTRAVENOUS | Status: DC | PRN
  Administered 2020-12-10: 10 mL
  Filled 2020-12-10: qty 10

## 2020-12-10 MED ORDER — ACETAMINOPHEN 325 MG PO TABS
650.0000 mg | ORAL_TABLET | Freq: Once | ORAL | Status: AC
  Administered 2020-12-10: 650 mg via ORAL

## 2020-12-10 MED ORDER — ACETAMINOPHEN 325 MG PO TABS
ORAL_TABLET | ORAL | Status: AC
  Filled 2020-12-10: qty 2

## 2020-12-10 MED ORDER — TRASTUZUMAB-DKST CHEMO 150 MG IV SOLR
6.0000 mg/kg | Freq: Once | INTRAVENOUS | Status: AC
  Administered 2020-12-10: 504 mg via INTRAVENOUS
  Filled 2020-12-10: qty 24

## 2020-12-10 NOTE — Telephone Encounter (Signed)
Left message for patient to let her know that per Dr. Lindi Adie it is ok to the Neuriva Plus she had asked me about.

## 2020-12-10 NOTE — Patient Instructions (Signed)
[name]  Discharge Instructions: Thank you for choosing [facility] to provide your oncology and hematology care.   If you have a lab appointment with the [name], please go directly to the [name] and check in at the registration area.   Wear comfortable clothing and clothing appropriate for easy access to any Portacath or PICC line.   We strive to give you quality time with your provider. You may need to reschedule your appointment if you arrive late (15 or more minutes).  Arriving late affects you and other patients whose appointments are after yours.  Also, if you miss three or more appointments without notifying the office, you may be dismissed from the clinic at the provider's discretion.      For prescription refill requests, have your pharmacy contact our office and allow 72 hours for refills to be completed.    Today you received the following chemotherapy and/or immunotherapy agents trastuzumab      To help prevent nausea and vomiting after your treatment, we encourage you to take your nausea medication as directed.  BELOW ARE SYMPTOMS THAT SHOULD BE REPORTED IMMEDIATELY: *FEVER GREATER THAN 100.4 F (38 C) OR HIGHER *CHILLS OR SWEATING *NAUSEA AND VOMITING THAT IS NOT CONTROLLED WITH YOUR NAUSEA MEDICATION *UNUSUAL SHORTNESS OF BREATH *UNUSUAL BRUISING OR BLEEDING *URINARY PROBLEMS (pain or burning when urinating, or frequent urination) *BOWEL PROBLEMS (unusual diarrhea, constipation, pain near the anus) TENDERNESS IN MOUTH AND THROAT WITH OR WITHOUT PRESENCE OF ULCERS (sore throat, sores in mouth, or a toothache) UNUSUAL RASH, SWELLING OR PAIN  UNUSUAL VAGINAL DISCHARGE OR ITCHING   Items with * indicate a potential emergency and should be followed up as soon as possible or go to the Emergency Department if any problems should occur.  Please show the CHEMOTHERAPY ALERT CARD or IMMUNOTHERAPY ALERT CARD at check-in to  the Emergency Department and triage nurse.  Should you have questions after your visit or need to cancel or reschedule your appointment, please contact [name]  Dept: [phone]  and follow the prompts.  Office hours are 8:00 a.m. to 4:30 p.m. Monday - Friday. Please note that voicemails left after 4:00 p.m. may not be returned until the following business day.  We are closed weekends and major holidays. You have access to a nurse at all times for urgent questions. Please call the main number to the clinic Dept: [phone] and follow the prompts.   For any non-urgent questions, you may also contact your provider using MyChart. We now offer e-Visits for anyone 18 and older to request care online for non-urgent symptoms. For details visit [email or web address].   Also download the MyChart app! Go to the app store, search "MyChart", open the app, select [facility], and log in with your MyChart username and password.  Due to Covid, a mask is required upon entering the hospital/clinic. If you do not have a mask, one will be given to you upon arrival. For doctor visits, patients may have 1 support person aged 18 or older with them. For treatment visits, patients cannot have anyone with them due to current Covid guidelines and our immunocompromised population.   

## 2020-12-12 NOTE — Pre-Procedure Instructions (Signed)
Surgical Instructions    Your procedure is scheduled on Monday June 6th.  Report to [facility] Main Entrance "A" at 11:00 A.M., then check in with the Admitting office.  Call this number if you have problems the morning of surgery:  [phone]   If you have any questions prior to your surgery date call [phone]: Open Monday-Friday 8am-4pm    Remember:  Do not eat after midnight the night before your surgery  You may drink clear liquids until 10:00 A.M. the morning of your surgery.   Clear liquids allowed are: Water, Non-Citrus Juices (without pulp), Carbonated Beverages, Clear Tea, Black Coffee Only, and Gatorade  Patient Instructions  . The night before surgery:  o No food after midnight. ONLY clear liquids after midnight  . The day of surgery (if you do NOT have diabetes):  o Drink ONE (1) Pre-Surgery Clear Ensure by 10:00 A.M. the morning of surgery. Drink in one sitting. Do not sip.  o This drink was given to you during your hospital  pre-op appointment visit.  o Nothing else to drink after completing the  Pre-Surgery Clear Ensure.        If you have questions, please contact your surgeon's office.   Take these medicines the morning of surgery with A SIP OF WATER   carboxymethylcellulose (REFRESH PLUS)- If needed     As of today, STOP taking any Aspirin (unless otherwise instructed by your surgeon) Aleve, Naproxen, Ibuprofen, Motrin, Advil, Goody's, BC's, all herbal medications, fish oil, and all vitamins.                     Do not wear jewelry, make up, or nail polish DO Not wear nail polish, gel polish, artificial nails, or any other type of covering on natural nails including finger and toenails. If patients have artificial nails, gel coating, etc. that need to be removed by a nail saloon please have this removed prior to surgery or surgery may need to be canceled/delayed if the surgeon/ anesthesia feels like the patient is unable to be adequately monitored.             Do not wear lotions, powders, perfumes/colognes, or deodorant.            Do not shave 48 hours prior to surgery.  Men may shave face and neck.            Do not bring valuables to the hospital.            [facility] is not responsible for any belongings or valuables.  Do NOT Smoke (Tobacco/Vaping) or drink Alcohol 24 hours prior to your procedure If you use a CPAP at night, you may bring all equipment for your overnight stay.   Contacts, glasses, dentures or bridgework may not be worn into surgery, please bring cases for these belongings   For patients admitted to the hospital, discharge time will be determined by your treatment team.   Patients discharged the day of surgery will not be allowed to drive home, and someone needs to stay with them for 24 hours.    Special instructions:    Oral Hygiene is also important to reduce your risk of infection.  Remember - BRUSH YOUR TEETH THE MORNING OF SURGERY WITH YOUR REGULAR TOOTHPASTE   [facility]- Preparing For Surgery  Before surgery, you can play an important role. Because skin is not sterile, your skin needs to be as free of germs as possible. You  can reduce the number of germs on your skin by washing with CHG (chlorahexidine gluconate) Soap before surgery.  CHG is an antiseptic cleaner which kills germs and bonds with the skin to continue killing germs even after washing.     Please do not use if you have an allergy to CHG or antibacterial soaps. If your skin becomes reddened/irritated stop using the CHG.  Do not shave (including legs and underarms) for at least 48 hours prior to first CHG shower. It is OK to shave your face.  Please follow these instructions carefully.    1.  Shower the NIGHT BEFORE SURGERY and the MORNING OF SURGERY with CHG Soap.   If you chose to wash your hair, wash your hair first as usual with your normal shampoo. After you shampoo, rinse your hair and body thoroughly to remove the shampoo.  Then [employer] and genitals (private parts) with your normal soap and rinse thoroughly to remove soap.  2. After that Use CHG Soap as you would any other liquid soap. You can apply CHG directly to the skin and wash gently with a scrungie or a clean washcloth.   3. Apply the CHG Soap to your body ONLY FROM THE NECK DOWN.  Do not use on open wounds or open sores. Avoid contact with your eyes, ears, mouth and genitals (private parts). Wash Face and genitals (private parts)  with your normal soap.   4. Wash thoroughly, paying special attention to the area where your surgery will be performed.  5. Thoroughly rinse your body with warm water from the neck down.  6. DO NOT shower/wash with your normal soap after using and rinsing off the CHG Soap.  7. Pat yourself dry with a CLEAN TOWEL.  8. Wear CLEAN PAJAMAS to bed the night before surgery  9. Place CLEAN SHEETS on your bed the night before your surgery  10. DO NOT SLEEP WITH PETS.   Day of Surgery:  Take a shower with CHG soap. Wear Clean/Comfortable clothing the morning of surgery Do not apply any deodorants/lotions.   Remember to brush your teeth WITH YOUR REGULAR TOOTHPASTE.   Please read over the following fact sheets that you were given.

## 2020-12-16 ENCOUNTER — Encounter (HOSPITAL_COMMUNITY): Payer: Self-pay

## 2020-12-16 ENCOUNTER — Other Ambulatory Visit: Payer: Self-pay

## 2020-12-16 ENCOUNTER — Encounter (HOSPITAL_COMMUNITY)
Admission: RE | Admit: 2020-12-16 | Discharge: 2020-12-16 | Disposition: A | Discharge status: Home / Self Care | Payer: Medicare Other | Source: Ambulatory Visit | Attending: General Surgery | Admitting: General Surgery

## 2020-12-16 DIAGNOSIS — C50911 Malignant neoplasm of unspecified site of right female breast: Secondary | ICD-10-CM | POA: Insufficient documentation

## 2020-12-16 DIAGNOSIS — Z01818 Encounter for other preprocedural examination: Secondary | ICD-10-CM | POA: Insufficient documentation

## 2020-12-16 HISTORY — DX: Other rheumatic tricuspid valve diseases: I07.8

## 2020-12-16 LAB — BASIC METABOLIC PANEL
Anion gap: 5 (ref 5–15)
BUN: 16 mg/dL (ref 8–23)
CO2: 27 mmol/L (ref 22–32)
Calcium: 9.4 mg/dL (ref 8.9–10.3)
Chloride: 106 mmol/L (ref 98–111)
Creatinine, Ser: 0.75 mg/dL (ref 0.44–1.00)
GFR, Estimated: >60 mL/min (ref >60)
Glucose, Bld: 89 mg/dL (ref 70–99)
Potassium: 4 mmol/L (ref 3.5–5.1)
Sodium: 138 mmol/L (ref 135–145)

## 2020-12-16 LAB — CBC
HCT: 38.6 % (ref 36.0–46.0)
Hemoglobin: 12.1 g/dL (ref 12.0–15.0)
MCH: 27.8 pg (ref 26.0–34.0)
MCHC: 31.3 g/dL (ref 30.0–36.0)
MCV: 88.5 fL (ref 80.0–100.0)
Platelets: 212 10*3/uL (ref 150–400)
RBC: 4.36 MIL/uL (ref 3.87–5.11)
RDW: 14.9 % (ref 11.5–15.5)
WBC: 6.7 10*3/uL (ref 4.0–10.5)
nRBC: 0 % (ref 0.0–0.2)

## 2020-12-16 NOTE — Progress Notes (Signed)
PCP - Osborne Casco, MD Cardiologist - denies  PPM/ICD - denies Device Orders - N/A Rep Notified - N/A  Chest x-ray - 10/29/2020 EKG - 12/16/2020 Stress Test - 01/23/2014 ECHO - 11/21/2020 Cardiac Cath - denies  Sleep Study - more than 5 years ago per patient CPAP - N/A  Fasting Blood Sugar - N/A  Blood Thinner Instructions: N/A Aspirin Instructions: Patient was instructed: As of today, STOP taking any Aspirin (unless otherwise instructed by your surgeon) Aleve, Naproxen, Ibuprofen, Motrin, Advil, Goody's, BC's, all herbal medications, fish oil, and all vitamins.  ERAS Protcol - yes PRE-SURGERY Ensure - yes  COVID TEST- test scheduled for 12/18/2020  Anesthesia review: yes; active cancer treatment  Patient denies shortness of breath, fever, cough and chest pain at PAT appointment   All instructions explained to the patient, with a verbal understanding of the material. Patient agrees to go over the instructions while at home for a better understanding. Patient also instructed to self quarantine after being tested for COVID-19. The opportunity to ask questions was provided.

## 2020-12-18 ENCOUNTER — Encounter: Payer: Self-pay | Admitting: *Deleted

## 2020-12-18 ENCOUNTER — Encounter (HOSPITAL_COMMUNITY): Payer: Self-pay | Admitting: Emergency Medicine

## 2020-12-18 ENCOUNTER — Telehealth: Payer: Self-pay | Admitting: *Deleted

## 2020-12-18 ENCOUNTER — Encounter (HOSPITAL_COMMUNITY): Payer: Self-pay

## 2020-12-18 ENCOUNTER — Other Ambulatory Visit (HOSPITAL_COMMUNITY): Payer: Medicare Other

## 2020-12-18 NOTE — Telephone Encounter (Signed)
Received call from pt stating she has spoke with Dr. Donne Hazel and is putting her surgery on hold while she undergoes further cardiac workup/clearance.  Dr. Lindi Adie notified and verbalized understanding. No orders received at this time.

## 2020-12-18 NOTE — Progress Notes (Signed)
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 I routed results to Dr. Lindi Adie for follow up.    VS: BP 130/90   Pulse 83   Temp (!) 36.2 C (Oral)   Resp 16   Ht 5' 0.25" (1.53 m)   Wt 85 kg   SpO2 97%   BMI 36.30 kg/m    PROVIDERS: - PCP is Kelton Pillar, MD - Oncologist is Nicholas Lose, MD   LABS: Labs reviewed: Acceptable for surgery. (all labs ordered are listed, but only abnormal results are displayed)  Labs Reviewed  BASIC METABOLIC PANEL  CBC    IMAGES: 1 view CXR 10/29/20:  - Port-A-Cath tip projects in the mid to lower superior vena cava. - Negative for pneumothorax.  EKG 12/16/20: NSR. Left axis deviation. Inferior infarct, age undetermined. Anterior infarct, age undetermined. Appears stable compared with EKG 02/18/14   CV: Echo 11/21/20:  1. Basal inferior hypokinesis. Left ventricular ejection fraction, by estimation, is 50 to 55%. The left ventricle has low normal function.  2. Right ventricular systolic function is normal. The right ventricular size is normal. There is normal pulmonary artery systolic pressure.  3. Trivial mitral valve regurgitation.  4. 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 It measures 11 x 10 mm. Review of echo from Jan 2022 (the only previous echo) the echodensity appears to be present there as well though windows are not as good. Would consider TEE to furhter evaluate.  5. The aortic valve is normal in structure. Aortic valve regurgitation is not visualized.  6. The  inferior vena cava is normal in size with greater than 50% respiratory variability, suggesting right atrial pressure of 3 mmHg.   Exercise treadmill test 02/22/14:  - Summary: Resting ECG: Normal. - Functional Capacity: Normal. - HR Response to Exercise: Normal Overall HR Response To Exercise. - Chest Pain: No Chest Pain. - Arrhythmias: Ventricular Premature Beats, Isolated. - ST Changes: No Changes From Baseline. - Overall Impression: Findings to be correlated w/imaging report   Past Medical History:  Diagnosis Date  . Anxiety   . Asthma    As Child  . Colon polyp   . Elevated cholesterol   . Intraductal papilloma of left breast     precancerous cells in R breast    Past Surgical History:  Procedure Laterality Date  . CYST REMOVAL TRUNK     breast   . FOOT SURGERY    . PORTACATH PLACEMENT Left 09/02/2020   Procedure: INSERTION PORT-A-CATH;  Surgeon: Rolm Bookbinder, MD;  Location: [facility];  Service: General;  Laterality: Left;  . TONSILLECTOMY     as a child    MEDICATIONS: . b complex vitamins capsule  . Biotin 5000 MCG CAPS  . carboxymethylcellulose (REFRESH PLUS) 0.5 % SOLN  . cholecalciferol (VITAMIN D3) 25 MCG (1000 UNIT) tablet  . Evening Primrose Oil 500 MG CAPS  . Garlic 3762 MG CAPS  . Multiple Vitamin (MULTIVITAMIN) tablet  . Vitamin A 7.5 MG (25000 UT)  CAPS  . Zinc Oxide-Vitamin C (ZINC PLUS VITAMIN C PO)   No current facility-administered medications for this encounter.   If no changes, I anticipate pt can proceed with surgery as scheduled.   Willeen Cass, PhD, FNP-BC [facility]/Anesthesiology Phone: [phone] 12/18/2020 2:41 PM

## 2020-12-18 NOTE — Anesthesia Preprocedure Evaluation (Deleted)
Anesthesia Evaluation Anesthesia Physical Anesthesia Plan  ASA:   Anesthesia Plan:    Post-op Pain Management:    Induction:   PONV Risk Score and Plan:   Airway Management Planned:   Additional Equipment:   Intra-op Plan:   Post-operative Plan:   Informed Consent:   Plan Discussed with:   Anesthesia Plan Comments: (See APP note by A. Kazue Cerro, FNP )        Anesthesia Quick Evaluation  

## 2020-12-19 ENCOUNTER — Telehealth: Payer: Self-pay | Admitting: Cardiology

## 2020-12-22 ENCOUNTER — Telehealth: Payer: Self-pay | Admitting: *Deleted

## 2020-12-22 ENCOUNTER — Encounter (HOSPITAL_COMMUNITY): Admission: RE | Payer: Self-pay | Source: Home / Self Care

## 2020-12-22 ENCOUNTER — Ambulatory Visit (HOSPITAL_COMMUNITY): Payer: Medicare Other

## 2020-12-22 ENCOUNTER — Ambulatory Visit (HOSPITAL_COMMUNITY): Admission: RE | Admit: 2020-12-22 | Payer: Medicare Other | Source: Home / Self Care | Admitting: General Surgery

## 2020-12-22 SURGERY — BREAST LUMPECTOMY WITH RADIOACTIVE SEED AND SENTINEL LYMPH NODE BIOPSY
Anesthesia: General | Site: Breast | Laterality: Right

## 2020-12-22 NOTE — Telephone Encounter (Signed)
   Name: [facility]  DOB: May 01, 1941  MRN: [id number]  Primary Cardiologist: None  Chart reviewed as part of pre-operative protocol coverage. Because of MAYERLI KIRST past medical history and time since last visit, she will require a follow-up visit in order to better assess preoperative cardiovascular risk.  Pre-op covering staff: -Please add "pre-op clearance" to the appointment notes  for appointment scheduled for tomorrow 12/23/20 with Dr. Gardiner Rhyme.  -Please contact requesting surgeon's office via preferred method (i.e, phone, fax) to inform them of need for appointment prior to surgery.  If applicable, this message will also be routed to pharmacy pool and/or primary cardiologist for input on holding anticoagulant/antiplatelet agent as requested below so that this information is available to the clearing provider at time of patient's appointment.   Kathyrn Drown, NP  12/22/2020, 2:25 PM

## 2020-12-22 NOTE — Telephone Encounter (Signed)
   [facility] Pre-operative Risk Assessment    Patient Name: Claudia Ortiz  DOB: 14-Feb-1941  MRN: [id number]   Request for surgical clearance:  1. What type of surgery is being performed? Right breast lumpectomy   2. When is this surgery scheduled? TBD   3. What type of clearance is required (medical clearance vs. Pharmacy clearance to hold med vs. Both)? medical  4. Are there any medications that need to be held prior to surgery and how long?N/A   5. Practice name and name of physician performing surgery? [facility] Dr. Donne Hazel   6. What is the office phone number? [phone]   7.   What is the office fax number? [phone]  8.   Anesthesia type (None, local, MAC, general) ? general   Claudia Ortiz A Tyra Gural 12/22/2020, 2:03 PM  _________________________________________________________________ OV tomorrow with Dr. Gardiner Rhyme 6/7

## 2020-12-22 NOTE — Telephone Encounter (Signed)
Pt has a New Pt appt for pre op clearance 12/23/20. I will forward clearance notes to MD for upcoming appt. Will send FYI to requesting office pt has New pt appt 12/23/20.

## 2020-12-23 ENCOUNTER — Ambulatory Visit (INDEPENDENT_AMBULATORY_CARE_PROVIDER_SITE_OTHER): Payer: Medicare Other | Admitting: Cardiology

## 2020-12-23 ENCOUNTER — Other Ambulatory Visit: Payer: Self-pay

## 2020-12-23 VITALS — BP 130/70 | Ht 65.5 in | Wt 189.0 lb

## 2020-12-23 DIAGNOSIS — I078 Other rheumatic tricuspid valve diseases: Secondary | ICD-10-CM

## 2020-12-23 DIAGNOSIS — Z01818 Encounter for other preprocedural examination: Secondary | ICD-10-CM | POA: Diagnosis not present

## 2020-12-23 DIAGNOSIS — Z01812 Encounter for preprocedural laboratory examination: Secondary | ICD-10-CM | POA: Diagnosis not present

## 2020-12-23 NOTE — Progress Notes (Signed)
Cardiology Office Note:    Date:  12/23/2020   ID:  Claudia Ortiz, DOB 08/05/1940, MRN [id number]  PCP:  Kelton Pillar, MD   [place] Providers Cardiologist:  None     Referring MD: Kelton Pillar, MD   Chief Complaint  Patient presents with  . Pre-op Exam    History of Present Illness:    Claudia Ortiz is a 80 y.o. female with a hx of anxiety, elevated cholesterol, colon polyp, intraductal papilloma of left breast, and tricuspid valve mass who is refered by Dr. Laurann Montana for pre-op clearance for lumpectomy.  Echocardiogram on 11/21/2020 showed EF 50 to 55%, normal RV function, echodensity on the tricuspid valve measuring 1 cm, mild TR.  Today, she is accompanied by a family member by phone, and by her husband who also provides some history. She appears well, and has not had any unusual fevers or chills. Lately, she has mild shortness of breath that she attributes to having asthma as a child. She has some lightheadedness as well due to chemotherapy. She states she could climb two flights of stairs without stopping. For exercise, she typically walks 2 full weeks in a month, up to 1.5 miles a day. She will sometimes alternate weeks, walking for one week and not exercising the next.  Denies any chest pain or dyspnea with this.  She has never been a smoker. She denies any chest pain, palpitations, or exertional symptoms. No headaches, or syncope to report. Also has no orthopnea or PND.   Past Medical History:  Diagnosis Date  . Anxiety   . Asthma    As Child  . Colon polyp   . Elevated cholesterol   . Intraductal papilloma of left breast     precancerous cells in R breast  . Tricuspid valve mass    on 11/21/20 echo    Past Surgical History:  Procedure Laterality Date  . CYST REMOVAL TRUNK     breast   . FOOT SURGERY    . PORTACATH PLACEMENT Left 09/02/2020   Procedure: INSERTION PORT-A-CATH;  Surgeon: Rolm Bookbinder, MD;  Location: [facility];   Service: General;  Laterality: Left;  . TONSILLECTOMY     as a child    Current Medications: Current Meds  Medication Sig  . b complex vitamins capsule Take 1 capsule by mouth daily.  . Biotin 5000 MCG CAPS Take 5,000 mcg by mouth daily.  . carboxymethylcellulose (REFRESH PLUS) 0.5 % SOLN Place 1 drop into both eyes 3 (three) times daily as needed (dry eyes).  . cholecalciferol (VITAMIN D3) 25 MCG (1000 UNIT) tablet Take 1,000 Units by mouth daily.  . Evening Primrose Oil 500 MG CAPS Take 500 mg by mouth daily.  . Garlic 5053 MG CAPS Take 1,000 mg by mouth daily.  . Multiple Vitamin (MULTIVITAMIN) tablet Take 1 tablet by mouth daily.  . Vitamin A 7.5 MG (25000 UT) CAPS Take 25,000 Units by mouth daily.  . Zinc Oxide-Vitamin C (ZINC PLUS VITAMIN C PO) Take 1 capsule by mouth daily.     Allergies:   Iodine, Shellfish allergy, Atorvastatin, Cephalosporins, and Penicillins   Social History   Socioeconomic History  . Marital status: Married    Spouse name: Not on file  . Number of children: Not on file  . Years of education: Not on file  . Highest education level: Not on file  Occupational History  . Not on file  Tobacco Use  . Smoking status: Never Smoker  .  Smokeless tobacco: Never Used  Substance and Sexual Activity  . Alcohol use: No  . Drug use: No  . Sexual activity: Not on file  Other Topics Concern  . Not on file  Social History Narrative  . Not on file   Social Determinants of Health   Financial Resource Strain: Not on file  Food Insecurity: Not on file  Transportation Needs: Not on file  Physical Activity: Not on file  Stress: Not on file  Social Connections: Not on file     Family History: The patient's family history includes Diabetes in her mother; Heart attack in her brother and mother; Heart disease in her father; Stroke in her father.  ROS:   Please see the history of present illness.    (+) Shortness of breath (+) Lightheadedness (+) LE  edema All other systems reviewed and are negative.  EKGs/Labs/Other Studies Reviewed:    The following studies were reviewed today:  Echo 11/21/2020: 1. Basal inferior hypokinesis. . Left ventricular ejection fraction, by  estimation, is 50 to 55%. The left ventricle has low normal function.  2. Right ventricular systolic function is normal. The right ventricular  size is normal. There is normal pulmonary artery systolic pressure.  3. Trivial mitral valve regurgitation.  4. 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 It measures 11 x 10 mm. Review of echo from Jan 2022 (the only previous echo) the echodensity appears to be present there as well though windows are not as good. Would consider TEE to further evaluate.  5. The aortic valve is normal in structure. Aortic valve regurgitation is  not visualized.  6. The inferior vena cava is normal in size with greater than 50%  respiratory variability, suggesting right atrial pressure of 3 mmHg.  Echo 07/25/2020: 1. Left ventricular ejection fraction, by estimation, is 55 to 60%. The  left ventricle has normal function. The left ventricle has no regional  wall motion abnormalities. Left ventricular diastolic parameters are  consistent with Grade I diastolic  dysfunction (impaired relaxation).  2. Right ventricular systolic function is normal. The right ventricular  size is normal.  3. The mitral valve is normal in structure. Trivial mitral valve  regurgitation. No evidence of mitral stenosis.  4. The aortic valve is tricuspid. Aortic valve regurgitation is not  visualized. No aortic stenosis is present.   Conclusion(s)/Recommendation(s): Normal biventricular function without evidence of hemodynamically significant valvular heart disease.  EKG:   12/23/2020: NSR, rate 89, Low voltage, Nonspecific T wave flattening  Recent Labs: 11/19/2020: ALT 18 12/16/2020: BUN 16; Creatinine, Ser 0.75;  Hemoglobin 12.1; Platelets 212; Potassium 4.0; Sodium 138  Recent Lipid Panel No results found for: CHOL, TRIG, HDL, CHOLHDL, VLDL, LDLCALC, LDLDIRECT    Physical Exam:    VS:  BP 130/70   Ht 5' 5.5" (1.664 m)   Wt 189 lb (85.7 kg)   SpO2 97%   BMI 30.97 kg/m     Wt Readings from Last 3 Encounters:  12/23/20 189 lb (85.7 kg)  12/16/20 187 lb 6.4 oz (85 kg)  12/10/20 187 lb 12 oz (85.2 kg)     GEN: Well nourished, well developed in no acute distress HEENT: Normal NECK: No JVD; No carotid bruits LYMPHATICS: No lymphadenopathy CARDIAC: RRR, no murmurs, rubs, gallops RESPIRATORY:  Clear to auscultation without rales, wheezing or rhonchi  ABDOMEN: Soft, non-tender, non-distended MUSCULOSKELETAL:  Trace LE edema; No deformity  SKIN: Warm and dry NEUROLOGIC:  Alert  and oriented x 3 PSYCHIATRIC:  Normal affect   ASSESSMENT:    1. Pre-op evaluation   2. Tricuspid valve mass   3. Pre-procedure lab exam    PLAN:    Tricuspid valve mass: Echocardiogram on 11/21/2020 showed echodensity on the tricuspid valve measuring 1 cm, mild TR. -TEE for further evaluation -Check blood cultures.  No symptoms to suggest infection  Preop evaluation: Prior to lumpectomy for breast cancer.  No symptoms to suggest active cardiac condition.  Good functional capacity, greater than 4 METS.  RCRI score 0.  Planning work-up of tricuspid valve mass as above, otherwise no work-up planned prior to surgery.   Shared Decision Making/Informed Consent The risks [esophageal damage, perforation (1:10,000 risk), bleeding, pharyngeal hematoma as well as other potential complications associated with conscious sedation including aspiration, arrhythmia, respiratory failure and death], benefits (treatment guidance and diagnostic support) and alternatives of a transesophageal echocardiogram were discussed in detail with Claudia Ortiz and she is willing to proceed.     RTC in 1 month.  Medication Adjustments/Labs and  Tests Ordered: Current medicines are reviewed at length with the patient today.  Concerns regarding medicines are outlined above.  Orders Placed This Encounter  Procedures  . Culture, blood (single)  . Culture, blood (single)  . Basic metabolic panel  . CBC  . EKG 12-Lead   No orders of the defined types were placed in this encounter.   Patient Instructions   You are scheduled for a TEE on _____ with Dr. _____.  Please arrive at the [facility] (Main Entrance A) at [facility]: [address] [place],  [address] at _____ am/pm. (1 hour prior to procedure unless lab work is needed; if lab work is needed arrive 1.5 hours ahead)  DIET: Nothing to eat or drink after midnight except a sip of water with medications (see medication instructions below)  FYI: For your safety, and to allow [place] to monitor your vital signs accurately during the surgery/procedure we request that   if you have artificial nails, gel coating, SNS etc. Please have those removed prior to your surgery/procedure. Not having the nail coverings /polish removed may result in cancellation or delay of your surgery/procedure.   Medication Instructions: Take medications as you normally would  Labs: tomorrow in office   Our in office lab hours are Monday-Friday 8:00-4:00, closed for lunch 12:45-1:45 pm.  No appointment needed.  You must have a responsible person to drive you home and stay in the waiting area during your procedure. Failure to do so could result in cancellation.  Bring your insurance cards.  *Special Note: Every effort is made to have your procedure done on time. Occasionally there are emergencies that occur at the hospital that may cause delays. Please be patient if a delay does occur.      I,Mathew Stumpf,acting as a Education administrator for Donato Heinz, MD.,have documented all relevant documentation on the behalf of Donato Heinz, MD,as directed by  Donato Heinz, MD while in  the presence of Donato Heinz, MD.  I, Donato Heinz, MD, have reviewed all documentation for this visit. The documentation on 12/23/20 for the exam, diagnosis, procedures, and orders are all accurate and complete.   Signed, Donato Heinz, MD  12/23/2020 11:58 PM    [facility]

## 2020-12-23 NOTE — Patient Instructions (Addendum)
  You are scheduled for a TEE on Thursday, 6/16 with Dr. Margaretann Loveless.  Please arrive at the [facility] (Main Entrance A) at [facility]: [address] [place], [place] [address] at 9:30 AM  DIET: Nothing to eat or drink after midnight except a sip of water with medications (see medication instructions below)  FYI: For your safety, and to allow [place] to monitor your vital signs accurately during the surgery/procedure we request that   if you have artificial nails, gel coating, SNS etc. Please have those removed prior to your surgery/procedure. Not having the nail coverings /polish removed may result in cancellation or delay of your surgery/procedure.   Medication Instructions: Take medications as you normally would  Labs: tomorrow in office   Our in office lab hours are Monday-Friday 8:00-4:00, closed for lunch 12:45-1:45 pm.  No appointment needed.  You must have a responsible person to drive you home and stay in the waiting area during your procedure. Failure to do so could result in cancellation.  Bring your insurance cards.  *Special Note: Every effort is made to have your procedure done on time. Occasionally there are emergencies that occur at the hospital that may cause delays. Please be patient if a delay does occur.

## 2020-12-23 NOTE — H&P (View-Only) (Signed)
Cardiology Office Note:    Date:  12/23/2020   ID:  Claudia Ortiz, DOB February 28, 1941, MRN [id number]  PCP:  Kelton Pillar, MD   [place] Providers Cardiologist:  None     Referring MD: Kelton Pillar, MD   Chief Complaint  Patient presents with  . Pre-op Exam    History of Present Illness:    Claudia Ortiz is a 80 y.o. female with a hx of anxiety, elevated cholesterol, colon polyp, intraductal papilloma of left breast, and tricuspid valve mass who is refered by Dr. Laurann Montana for pre-op clearance for lumpectomy.  Echocardiogram on 11/21/2020 showed EF 50 to 55%, normal RV function, echodensity on the tricuspid valve measuring 1 cm, mild TR.  Today, she is accompanied by a family member by phone, and by her husband who also provides some history. She appears well, and has not had any unusual fevers or chills. Lately, she has mild shortness of breath that she attributes to having asthma as a child. She has some lightheadedness as well due to chemotherapy. She states she could climb two flights of stairs without stopping. For exercise, she typically walks 2 full weeks in a month, up to 1.5 miles a day. She will sometimes alternate weeks, walking for one week and not exercising the next.  Denies any chest pain or dyspnea with this.  She has never been a smoker. She denies any chest pain, palpitations, or exertional symptoms. No headaches, or syncope to report. Also has no orthopnea or PND.   Past Medical History:  Diagnosis Date  . Anxiety   . Asthma    As Child  . Colon polyp   . Elevated cholesterol   . Intraductal papilloma of left breast     precancerous cells in R breast  . Tricuspid valve mass    on 11/21/20 echo    Past Surgical History:  Procedure Laterality Date  . CYST REMOVAL TRUNK     breast   . FOOT SURGERY    . PORTACATH PLACEMENT Left 09/02/2020   Procedure: INSERTION PORT-A-CATH;  Surgeon: Rolm Bookbinder, MD;  Location: [facility];   Service: General;  Laterality: Left;  . TONSILLECTOMY     as a child    Current Medications: Current Meds  Medication Sig  . b complex vitamins capsule Take 1 capsule by mouth daily.  . Biotin 5000 MCG CAPS Take 5,000 mcg by mouth daily.  . carboxymethylcellulose (REFRESH PLUS) 0.5 % SOLN Place 1 drop into both eyes 3 (three) times daily as needed (dry eyes).  . cholecalciferol (VITAMIN D3) 25 MCG (1000 UNIT) tablet Take 1,000 Units by mouth daily.  . Evening Primrose Oil 500 MG CAPS Take 500 mg by mouth daily.  . Garlic 5631 MG CAPS Take 1,000 mg by mouth daily.  . Multiple Vitamin (MULTIVITAMIN) tablet Take 1 tablet by mouth daily.  . Vitamin A 7.5 MG (25000 UT) CAPS Take 25,000 Units by mouth daily.  . Zinc Oxide-Vitamin C (ZINC PLUS VITAMIN C PO) Take 1 capsule by mouth daily.     Allergies:   Iodine, Shellfish allergy, Atorvastatin, Cephalosporins, and Penicillins   Social History   Socioeconomic History  . Marital status: Married    Spouse name: Not on file  . Number of children: Not on file  . Years of education: Not on file  . Highest education level: Not on file  Occupational History  . Not on file  Tobacco Use  . Smoking status: Never Smoker  .  Smokeless tobacco: Never Used  Substance and Sexual Activity  . Alcohol use: No  . Drug use: No  . Sexual activity: Not on file  Other Topics Concern  . Not on file  Social History Narrative  . Not on file   Social Determinants of Health   Financial Resource Strain: Not on file  Food Insecurity: Not on file  Transportation Needs: Not on file  Physical Activity: Not on file  Stress: Not on file  Social Connections: Not on file     Family History: The patient's family history includes Diabetes in her mother; Heart attack in her brother and mother; Heart disease in her father; Stroke in her father.  ROS:   Please see the history of present illness.    (+) Shortness of breath (+) Lightheadedness (+) LE  edema All other systems reviewed and are negative.  EKGs/Labs/Other Studies Reviewed:    The following studies were reviewed today:  Echo 11/21/2020: 1. Basal inferior hypokinesis. . Left ventricular ejection fraction, by  estimation, is 50 to 55%. The left ventricle has low normal function.  2. Right ventricular systolic function is normal. The right ventricular  size is normal. There is normal pulmonary artery systolic pressure.  3. Trivial mitral valve regurgitation.  4. 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 It measures 11 x 10 mm. Review of echo from Jan 2022 (the only previous echo) the echodensity appears to be present there as well though windows are not as good. Would consider TEE to further evaluate.  5. The aortic valve is normal in structure. Aortic valve regurgitation is  not visualized.  6. The inferior vena cava is normal in size with greater than 50%  respiratory variability, suggesting right atrial pressure of 3 mmHg.  Echo 07/25/2020: 1. Left ventricular ejection fraction, by estimation, is 55 to 60%. The  left ventricle has normal function. The left ventricle has no regional  wall motion abnormalities. Left ventricular diastolic parameters are  consistent with Grade I diastolic  dysfunction (impaired relaxation).  2. Right ventricular systolic function is normal. The right ventricular  size is normal.  3. The mitral valve is normal in structure. Trivial mitral valve  regurgitation. No evidence of mitral stenosis.  4. The aortic valve is tricuspid. Aortic valve regurgitation is not  visualized. No aortic stenosis is present.   Conclusion(s)/Recommendation(s): Normal biventricular function without evidence of hemodynamically significant valvular heart disease.  EKG:   12/23/2020: NSR, rate 89, Low voltage, Nonspecific T wave flattening  Recent Labs: 11/19/2020: ALT 18 12/16/2020: BUN 16; Creatinine, Ser 0.75;  Hemoglobin 12.1; Platelets 212; Potassium 4.0; Sodium 138  Recent Lipid Panel No results found for: CHOL, TRIG, HDL, CHOLHDL, VLDL, LDLCALC, LDLDIRECT    Physical Exam:    VS:  BP 130/70   Ht 5' 5.5" (1.664 m)   Wt 189 lb (85.7 kg)   SpO2 97%   BMI 30.97 kg/m     Wt Readings from Last 3 Encounters:  12/23/20 189 lb (85.7 kg)  12/16/20 187 lb 6.4 oz (85 kg)  12/10/20 187 lb 12 oz (85.2 kg)     GEN: Well nourished, well developed in no acute distress HEENT: Normal NECK: No JVD; No carotid bruits LYMPHATICS: No lymphadenopathy CARDIAC: RRR, no murmurs, rubs, gallops RESPIRATORY:  Clear to auscultation without rales, wheezing or rhonchi  ABDOMEN: Soft, non-tender, non-distended MUSCULOSKELETAL:  Trace LE edema; No deformity  SKIN: Warm and dry NEUROLOGIC:  Alert  and oriented x 3 PSYCHIATRIC:  Normal affect   ASSESSMENT:    1. Pre-op evaluation   2. Tricuspid valve mass   3. Pre-procedure lab exam    PLAN:    Tricuspid valve mass: Echocardiogram on 11/21/2020 showed echodensity on the tricuspid valve measuring 1 cm, mild TR. -TEE for further evaluation -Check blood cultures.  No symptoms to suggest infection  Preop evaluation: Prior to lumpectomy for breast cancer.  No symptoms to suggest active cardiac condition.  Good functional capacity, greater than 4 METS.  RCRI score 0.  Planning work-up of tricuspid valve mass as above, otherwise no work-up planned prior to surgery.   Shared Decision Making/Informed Consent The risks [esophageal damage, perforation (1:10,000 risk), bleeding, pharyngeal hematoma as well as other potential complications associated with conscious sedation including aspiration, arrhythmia, respiratory failure and death], benefits (treatment guidance and diagnostic support) and alternatives of a transesophageal echocardiogram were discussed in detail with Ms. Dona and she is willing to proceed.     RTC in 1 month.  Medication Adjustments/Labs and  Tests Ordered: Current medicines are reviewed at length with the patient today.  Concerns regarding medicines are outlined above.  Orders Placed This Encounter  Procedures  . Culture, blood (single)  . Culture, blood (single)  . Basic metabolic panel  . CBC  . EKG 12-Lead   No orders of the defined types were placed in this encounter.   Patient Instructions   You are scheduled for a TEE on _____ with Dr. _____.  Please arrive at the [facility] (Main Entrance A) at [facility]: [address] [place], [place] [address] at _____ am/pm. (1 hour prior to procedure unless lab work is needed; if lab work is needed arrive 1.5 hours ahead)  DIET: Nothing to eat or drink after midnight except a sip of water with medications (see medication instructions below)  FYI: For your safety, and to allow [place] to monitor your vital signs accurately during the surgery/procedure we request that   if you have artificial nails, gel coating, SNS etc. Please have those removed prior to your surgery/procedure. Not having the nail coverings /polish removed may result in cancellation or delay of your surgery/procedure.   Medication Instructions: Take medications as you normally would  Labs: tomorrow in office   Our in office lab hours are Monday-Friday 8:00-4:00, closed for lunch 12:45-1:45 pm.  No appointment needed.  You must have a responsible person to drive you home and stay in the waiting area during your procedure. Failure to do so could result in cancellation.  Bring your insurance cards.  *Special Note: Every effort is made to have your procedure done on time. Occasionally there are emergencies that occur at the hospital that may cause delays. Please be patient if a delay does occur.      I,Mathew Stumpf,acting as a Education administrator for Donato Heinz, MD.,have documented all relevant documentation on the behalf of Donato Heinz, MD,as directed by  Donato Heinz, MD while in  the presence of Donato Heinz, MD.  I, Donato Heinz, MD, have reviewed all documentation for this visit. The documentation on 12/23/20 for the exam, diagnosis, procedures, and orders are all accurate and complete.   Signed, Donato Heinz, MD  12/23/2020 11:58 PM    [facility]

## 2020-12-24 DIAGNOSIS — I078 Other rheumatic tricuspid valve diseases: Secondary | ICD-10-CM | POA: Diagnosis not present

## 2020-12-24 DIAGNOSIS — Z01812 Encounter for preprocedural laboratory examination: Secondary | ICD-10-CM | POA: Diagnosis not present

## 2020-12-24 NOTE — Telephone Encounter (Signed)
Error. Patient has Cardiology F/U with [facility]

## 2020-12-25 ENCOUNTER — Encounter: Payer: Self-pay | Admitting: Hematology and Oncology

## 2020-12-25 LAB — CBC
Hematocrit: 37.7 % (ref 34.0–46.6)
Hemoglobin: 12.6 g/dL (ref 11.1–15.9)
MCH: 27.9 pg (ref 26.6–33.0)
MCHC: 33.4 g/dL (ref 31.5–35.7)
MCV: 83 fL (ref 79–97)
Platelets: 93 10*3/uL — CL (ref 150–450)
RBC: 4.52 x10E6/uL (ref 3.77–5.28)
RDW: 13.6 % (ref 11.7–15.4)
WBC: 6 10*3/uL (ref 3.4–10.8)

## 2020-12-25 LAB — BASIC METABOLIC PANEL
BUN/Creatinine Ratio: 18 (ref 12–28)
BUN: 15 mg/dL (ref 8–27)
CO2: 21 mmol/L (ref 20–29)
Calcium: 9.5 mg/dL (ref 8.7–10.3)
Chloride: 106 mmol/L (ref 96–106)
Creatinine, Ser: 0.83 mg/dL (ref 0.57–1.00)
Glucose: 100 mg/dL — ABNORMAL HIGH (ref 65–99)
Potassium: 4.3 mmol/L (ref 3.5–5.2)
Sodium: 142 mmol/L (ref 134–144)
eGFR: 71 mL/min/{1.73_m2} (ref >59)

## 2020-12-25 NOTE — Progress Notes (Signed)
The following biosimilar Kanjinti (trastuzumab-anns) has been selected for use in this patient.  Kennith Center, Pharm.D., CPP 12/25/2020@1 :47 PM

## 2020-12-25 NOTE — Telephone Encounter (Signed)
Pt is returning call to Hayley from earlier today. Please advise 

## 2020-12-25 NOTE — Telephone Encounter (Signed)
D/W Claudia Ortiz pt needs to have CBC re-drawn because it was hemolyzed.   Left detailed message for pt to come to the lab for re-draw

## 2020-12-26 ENCOUNTER — Other Ambulatory Visit: Payer: Self-pay | Admitting: *Deleted

## 2020-12-26 DIAGNOSIS — I078 Other rheumatic tricuspid valve diseases: Secondary | ICD-10-CM

## 2020-12-26 NOTE — Telephone Encounter (Signed)
Patient has been seen.

## 2020-12-29 DIAGNOSIS — I078 Other rheumatic tricuspid valve diseases: Secondary | ICD-10-CM | POA: Diagnosis not present

## 2020-12-29 LAB — CBC
Hematocrit: 35.3 % (ref 34.0–46.6)
Hemoglobin: 12 g/dL (ref 11.1–15.9)
MCH: 27.8 pg (ref 26.6–33.0)
MCHC: 34 g/dL (ref 31.5–35.7)
MCV: 82 fL (ref 79–97)
Platelets: 195 10*3/uL (ref 150–450)
RBC: 4.31 x10E6/uL (ref 3.77–5.28)
RDW: 12.9 % (ref 11.7–15.4)
WBC: 4.9 10*3/uL (ref 3.4–10.8)

## 2020-12-30 LAB — CULTURE, BLOOD (SINGLE)

## 2020-12-30 NOTE — Progress Notes (Signed)
Patient Care Team: Kelton Pillar, MD as PCP - General (Family Medicine) Rockwell Germany, RN as Nurse Navigator Tressie Ellis, Paulette Blanch, RN as Oncology Nurse Navigator Rolm Bookbinder, MD as Consulting Physician (General Surgery) Nicholas Lose, MD as Consulting Physician (Hematology and Oncology) Kyung Rudd, MD as Consulting Physician (Radiation Oncology)  DIAGNOSIS:    ICD-10-CM   1. Malignant neoplasm of lower-outer quadrant of right breast of female, estrogen receptor positive ([place])  C50.511    Z17.0       SUMMARY OF ONCOLOGIC HISTORY: Oncology History  Malignant neoplasm of lower-outer quadrant of right breast of female, estrogen receptor positive ([place])  07/14/2020 Initial Diagnosis   Screening mammogram showed a 1.5cm right breast mass. US showed the 1.4cm mass at the 7 o'clock position, a 2.7cm mass at the 9 o'clock position, and benign right axillary lymph nodes. Biopsy showed ALH at the 9 o'clock position, and at the 7 o'clock position, IDC, grade 3, HER-2 positive (3+), ER+ 20% weak, PR- 0%, Ki67 40%.   09/03/2020 - 11/19/2020 Chemotherapy      Patient is on Antibody Plan: BREAST TRASTUZUMAB Q21D     12/10/2020 -  Chemotherapy      Patient is on Antibody Plan: BREAST TRASTUZUMAB Q21D       CHIEF COMPLIANT:  Herceptin maintenance  INTERVAL HISTORY: Claudia Ortiz is a 80 y.o. with above-mentioned history of right breast cancer who completed neoadjuvant chemotherapy with Taxol Herceptin.  She is currently on Herceptin maintenance therapy.  She is tolerating it extremely well without any problems or concerns.  She is awaiting surgery pending cardiac clearance.  On the echocardiogram there was a note made of mass on tricuspid valve and this led to recommendation for TEE.  She is undergoing that tomorrow.  ALLERGIES:  is allergic to iodine, shellfish allergy, atorvastatin, cephalosporins, and penicillins.  MEDICATIONS:  Current Outpatient Medications  Medication Sig  Dispense Refill   b complex vitamins capsule Take 1 capsule by mouth daily.     Biotin 5000 MCG CAPS Take 5,000 mcg by mouth daily.     carboxymethylcellulose (REFRESH PLUS) 0.5 % SOLN Place 1 drop into both eyes 3 (three) times daily as needed (dry eyes).     cholecalciferol (VITAMIN D3) 25 MCG (1000 UNIT) tablet Take 1,000 Units by mouth daily.     Evening Primrose Oil 500 MG CAPS Take 500 mg by mouth daily.     Garlic 7829 MG CAPS Take 1,000 mg by mouth daily.     Multiple Vitamin (MULTIVITAMIN) tablet Take 1 tablet by mouth daily.     Vitamin A 7.5 MG (25000 UT) CAPS Take 25,000 Units by mouth daily.     Zinc Oxide-Vitamin C (ZINC PLUS VITAMIN C PO) Take 1 capsule by mouth daily.     No current facility-administered medications for this visit.   Facility-Administered Medications Ordered in Other Visits  Medication Dose Route Frequency Provider Last Rate Last Admin   sodium chloride flush (NS) 0.9 % injection 10 mL  10 mL Intracatheter PRN Nicholas Lose, MD   10 mL at 12/31/20 1306    PHYSICAL EXAMINATION: ECOG PERFORMANCE STATUS: 1 - Symptomatic but completely ambulatory  Vitals:   12/31/20 1117  BP: (!) 147/83  Pulse: 86  Resp: 18  Temp: 97.9 F (36.6 C)  SpO2: 100%   Filed Weights   12/31/20 1117  Weight: 187 lb 14.4 oz (85.2 kg)     LABORATORY DATA:  I have reviewed the data as listed  CMP Latest Ref Rng & Units 12/31/2020 12/24/2020 12/16/2020  Glucose 70 - 99 mg/dL 94 100(H) 89  BUN 8 - 23 mg/dL _0 Creatinine 0.44 - 1.00 mg/dL 0.82 0.83 0.75  Sodium 135 - 145 mmol/L 140 142 138  Potassium 3.5 - 5.1 mmol/L 4.0 4.3 4.0  Chloride 98 - 111 mmol/L 107 106 106  CO2 22 - 32 mmol/L _1 Calcium 8.9 - 10.3 mg/dL 9.5 9.5 9.4  Total Protein 6.5 - 8.1 g/dL 6.7 - -  Total Bilirubin 0.3 - 1.2 mg/dL 0.6 - -  Alkaline Phos 38 - 126 U/L 88 - -  AST 15 - 41 U/L 21 - -  ALT 0 - 44 U/L 18 - -    Lab Results  Component Value Date   WBC 4.8 12/31/2020   HGB 11.6 (L)  12/31/2020   HCT 35.3 (L) 12/31/2020   MCV 85.9 12/31/2020   PLT 194 12/31/2020   NEUTROABS 2.0 12/31/2020    ASSESSMENT & PLAN:  Malignant neoplasm of lower-outer quadrant of right breast of female, estrogen receptor positive ([place]) 07/14/2020:Screening mammogram showed a 1.5cm right breast mass. US showed the 1.4cm mass at the 7 o'clock position, a 2.7cm mass at the 9 o'clock position, and benign right axillary lymph nodes. Biopsy showed ALH at the 9 o'clock position, and at the 7 o'clock position, IDC, grade 3, HER-2 positive (3+), ER+ 20% weak, PR- 0%, Ki67 40%.   Treatment Plan: 1. Neoadjuvant chemotherapy with Taxol Herceptin followed by Herceptin maintenance for 1 year 2. Followed by breast conserving surgery if possible with sentinel lymph node study 3. Followed by adjuvant radiation therapy if patient had lumpectomy 4.  Followed by adjuvant antiestrogen therapy ------------------------------------------------------------------------------------------------------------------------------- Current treatment: Herceptin maintenance   Echocardiogram May 2022: EF 50 to 55% Echocardiogram 11/21/2020: EF 50 to 55%   Breast MRI: Motion defect. Surgery has been delayed because of waiting for cardiac clearance. 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 Dr. Donne Hazel will get her back in for surgery appointment as soon as it is possible.   Return to clinic every 3 weeks for Herceptin every 6 weeks to follow-up with me.    No orders of the defined types were placed in this encounter.  The patient has a good understanding of the overall plan. she agrees with it. she will call with any problems that may develop before the next visit here.  Total time spent: 30 mins including face to face time and time spent for planning, charting and coordination of care  Rulon Eisenmenger, MD, MPH 12/31/2020  I, Thana Ates, am acting as scribe for Dr. Nicholas Lose.  I have reviewed the above  documentation for accuracy and completeness, and I agree with the above.

## 2020-12-30 NOTE — Assessment & Plan Note (Signed)
07/14/2020:Screening mammogram showed a 1.5cm right breast mass. US showed the 1.4cm mass at the 7 o'clock position, a 2.7cm mass at the 9 o'clock position, and benign right axillary lymph nodes. Biopsy showed ALH at the 9 o'clock position, and at the 7 o'clock position, IDC, grade 3, HER-2 positive (3+), ER+ 20% weak, PR- 0%, Ki67 40%.  Treatment Plan: 1. Neoadjuvant chemotherapy withTaxol Herceptinfollowed by Herceptin maintenance for 1 year 2. Followed by breast conserving surgery if possible with sentinel lymph node study 3. Followed by adjuvant radiation therapy if patient had lumpectomy 4.Followed by adjuvant antiestrogen therapy ------------------------------------------------------------------------------------------------------------------------------- Current treatment: Herceptin maintenance Echocardiogram May 2022: EF 50 to 55%  Breast MRI: Motion defect. She had several questions about surgery including the role of radiation seeds.  Return to clinic every 3 weeks for Herceptin every 6 weeks to follow-up with me.

## 2020-12-31 ENCOUNTER — Inpatient Hospital Stay: Payer: Medicare Other

## 2020-12-31 ENCOUNTER — Inpatient Hospital Stay (HOSPITAL_BASED_OUTPATIENT_CLINIC_OR_DEPARTMENT_OTHER): Payer: Medicare Other | Admitting: Hematology and Oncology

## 2020-12-31 ENCOUNTER — Encounter: Payer: Self-pay | Admitting: *Deleted

## 2020-12-31 ENCOUNTER — Other Ambulatory Visit: Payer: Self-pay

## 2020-12-31 ENCOUNTER — Inpatient Hospital Stay: Payer: Medicare Other | Attending: Hematology and Oncology

## 2020-12-31 DIAGNOSIS — Z79899 Other long term (current) drug therapy: Secondary | ICD-10-CM | POA: Insufficient documentation

## 2020-12-31 DIAGNOSIS — Z5112 Encounter for antineoplastic immunotherapy: Secondary | ICD-10-CM | POA: Insufficient documentation

## 2020-12-31 DIAGNOSIS — C50511 Malignant neoplasm of lower-outer quadrant of right female breast: Secondary | ICD-10-CM

## 2020-12-31 DIAGNOSIS — Z888 Allergy status to other drugs, medicaments and biological substances status: Secondary | ICD-10-CM | POA: Diagnosis not present

## 2020-12-31 DIAGNOSIS — Z170 Estrogen receptor positive status [ER+]: Secondary | ICD-10-CM | POA: Diagnosis not present

## 2020-12-31 DIAGNOSIS — Z880 Allergy status to penicillin: Secondary | ICD-10-CM | POA: Insufficient documentation

## 2020-12-31 DIAGNOSIS — Z881 Allergy status to other antibiotic agents status: Secondary | ICD-10-CM | POA: Diagnosis not present

## 2020-12-31 DIAGNOSIS — Z95828 Presence of other vascular implants and grafts: Secondary | ICD-10-CM

## 2020-12-31 LAB — CMP (CANCER CENTER ONLY)
ALT: 18 U/L (ref 0–44)
AST: 21 U/L (ref 15–41)
Albumin: 3.7 g/dL (ref 3.5–5.0)
Alkaline Phosphatase: 88 U/L (ref 38–126)
Anion gap: 7 (ref 5–15)
BUN: 14 mg/dL (ref 8–23)
CO2: 26 mmol/L (ref 22–32)
Calcium: 9.5 mg/dL (ref 8.9–10.3)
Chloride: 107 mmol/L (ref 98–111)
Creatinine: 0.82 mg/dL (ref 0.44–1.00)
GFR, Estimated: >60 mL/min (ref >60)
Glucose, Bld: 94 mg/dL (ref 70–99)
Potassium: 4 mmol/L (ref 3.5–5.1)
Sodium: 140 mmol/L (ref 135–145)
Total Bilirubin: 0.6 mg/dL (ref 0.3–1.2)
Total Protein: 6.7 g/dL (ref 6.5–8.1)

## 2020-12-31 LAB — CBC WITH DIFFERENTIAL (CANCER CENTER ONLY)
Abs Immature Granulocytes: 0.01 10*3/uL (ref 0.00–0.07)
Basophils Absolute: 0 10*3/uL (ref 0.0–0.1)
Basophils Relative: 1 %
Eosinophils Absolute: 0.2 10*3/uL (ref 0.0–0.5)
Eosinophils Relative: 3 %
HCT: 35.3 % — ABNORMAL LOW (ref 36.0–46.0)
Hemoglobin: 11.6 g/dL — ABNORMAL LOW (ref 12.0–15.0)
Immature Granulocytes: 0 %
Lymphocytes Relative: 45 %
Lymphs Abs: 2.2 10*3/uL (ref 0.7–4.0)
MCH: 28.2 pg (ref 26.0–34.0)
MCHC: 32.9 g/dL (ref 30.0–36.0)
MCV: 85.9 fL (ref 80.0–100.0)
Monocytes Absolute: 0.5 10*3/uL (ref 0.1–1.0)
Monocytes Relative: 10 %
Neutro Abs: 2 10*3/uL (ref 1.7–7.7)
Neutrophils Relative %: 41 %
Platelet Count: 194 10*3/uL (ref 150–400)
RBC: 4.11 MIL/uL (ref 3.87–5.11)
RDW: 14 % (ref 11.5–15.5)
WBC Count: 4.8 10*3/uL (ref 4.0–10.5)
nRBC: 0 % (ref 0.0–0.2)

## 2020-12-31 MED ORDER — SODIUM CHLORIDE 0.9 % IV SOLN
Freq: Once | INTRAVENOUS | Status: AC
  Filled 2020-12-31: qty 250

## 2020-12-31 MED ORDER — HEPARIN SOD (PORK) LOCK FLUSH 100 UNIT/ML IV SOLN
500.0000 [IU] | Freq: Once | INTRAVENOUS | Status: AC | PRN
Start: 2020-12-31 — End: 2020-12-31
  Administered 2020-12-31: 500 [IU]
  Filled 2020-12-31: qty 5

## 2020-12-31 MED ORDER — TRASTUZUMAB-ANNS CHEMO 150 MG IV SOLR
6.0000 mg/kg | Freq: Once | INTRAVENOUS | Status: AC
  Administered 2020-12-31: 504 mg via INTRAVENOUS
  Filled 2020-12-31: qty 24

## 2020-12-31 MED ORDER — SODIUM CHLORIDE 0.9% FLUSH
10.0000 mL | Freq: Once | INTRAVENOUS | Status: AC
  Administered 2020-12-31: 10 mL
  Filled 2020-12-31: qty 10

## 2020-12-31 MED ORDER — DIPHENHYDRAMINE HCL 25 MG PO CAPS
ORAL_CAPSULE | ORAL | Status: AC
  Filled 2020-12-31: qty 2

## 2020-12-31 MED ORDER — DIPHENHYDRAMINE HCL 25 MG PO CAPS
50.0000 mg | ORAL_CAPSULE | Freq: Once | ORAL | Status: AC
Start: 2020-12-31 — End: 2020-12-31
  Administered 2020-12-31: 50 mg via ORAL

## 2020-12-31 MED ORDER — ACETAMINOPHEN 325 MG PO TABS
ORAL_TABLET | ORAL | Status: AC
  Filled 2020-12-31: qty 2

## 2020-12-31 MED ORDER — SODIUM CHLORIDE 0.9% FLUSH
10.0000 mL | INTRAVENOUS | Status: DC | PRN
  Administered 2020-12-31: 10 mL
  Filled 2020-12-31: qty 10

## 2020-12-31 MED ORDER — ACETAMINOPHEN 325 MG PO TABS
650.0000 mg | ORAL_TABLET | Freq: Once | ORAL | Status: AC
  Administered 2020-12-31: 650 mg via ORAL

## 2020-12-31 NOTE — Patient Instructions (Signed)
[facility] MEDICAL [facility]COLOG[facility]  Discharge Instructions: Thank you for choosing [facility] to provide your oncology and hematology care.   If you have a lab appointment with the Cancer Center, please go directly to the Cancer Center and check in at the registration area.   Wear comfortable clothing and clothing appropriate for easy access to any Portacath or PICC line.   We strive to give you quality time with your provider. You may need to reschedule your appointment if you arrive late (15 or more minutes).  Arriving late affects you and other patients whose appointments are after yours.  Also, if you miss three or more appointments without notifying the office, you may be dismissed from the clinic at the provider's discretion.      For prescription refill requests, have your pharmacy contact our office and allow 72 hours for refills to be completed.    Today you received the following chemotherapy and/or immunotherapy agents trastuzumab      To help prevent nausea and vomiting after your treatment, we encourage you to take your nausea medication as directed.  BELOW ARE SYMPTOMS THAT SHOULD BE REPORTED IMMEDIATELY: *FEVER GREATER THAN 100.4 F (38 C) OR HIGHER *CHILLS OR SWEATING *NAUSEA AND VOMITING THAT IS NOT CONTROLLED WITH YOUR NAUSEA MEDICATION *UNUSUAL SHORTNESS OF BREATH *UNUSUAL BRUISING OR BLEEDING *URINARY PROBLEMS (pain or burning when urinating, or frequent urination) *BOWEL PROBLEMS (unusual diarrhea, constipation, pain near the anus) TENDERNESS IN MOUTH AND THROAT WITH OR WITHOUT PRESENCE OF ULCERS (sore throat, sores in mouth, or a toothache) UNUSUAL RASH, SWELLING OR PAIN  UNUSUAL VAGINAL DISCHARGE OR ITCHING   Items with * indicate a potential emergency and should be followed up as soon as possible or go to the Emergency Department if any problems should occur.  Please show the CHEMOTHERAPY ALERT CARD or IMMUNOTHERAPY ALERT CARD at check-in to  the Emergency Department and triage nurse.  Should you have questions after your visit or need to cancel or reschedule your appointment, please contact [facility]  Dept: [phone]  and follow the prompts.  Office hours are 8:00 a.m. to 4:30 p.m. Monday - Friday. Please note that voicemails left after 4:00 p.m. may not be returned until the following business day.  We are closed weekends and major holidays. You have access to a nurse at all times for urgent questions. Please call the main number to the clinic Dept: [phone] and follow the prompts.   For any non-urgent questions, you may also contact your provider using MyChart. We now offer e-Visits for anyone 18 and older to request care online for non-urgent symptoms. For details visit [email or web address].   Also download the MyChart app! Go to the app store, search "MyChart", open the app, select [place], and log in with your MyChart username and password.  Due to Covid, a mask is required upon entering the hospital/clinic. If you do not have a mask, one will be given to you upon arrival. For doctor visits, patients may have 1 support person aged 18 or older with them. For treatment visits, patients cannot have anyone with them due to current Covid guidelines and our immunocompromised population.   

## 2021-01-01 ENCOUNTER — Other Ambulatory Visit: Payer: Self-pay

## 2021-01-01 ENCOUNTER — Ambulatory Visit (HOSPITAL_COMMUNITY)
Admission: RE | Admit: 2021-01-01 | Discharge: 2021-01-01 | Disposition: A | Discharge status: Home / Self Care | Payer: Medicare Other | Attending: Internal Medicine | Admitting: Internal Medicine

## 2021-01-01 ENCOUNTER — Ambulatory Visit (HOSPITAL_COMMUNITY): Payer: Medicare Other | Admitting: Anesthesiology

## 2021-01-01 ENCOUNTER — Encounter (HOSPITAL_COMMUNITY): Payer: Self-pay | Admitting: Internal Medicine

## 2021-01-01 ENCOUNTER — Other Ambulatory Visit: Payer: Self-pay | Admitting: Cardiology

## 2021-01-01 ENCOUNTER — Ambulatory Visit (HOSPITAL_BASED_OUTPATIENT_CLINIC_OR_DEPARTMENT_OTHER)
Admission: RE | Admit: 2021-01-01 | Discharge: 2021-01-01 | Disposition: A | Discharge status: Home / Self Care | Payer: Medicare Other | Source: Ambulatory Visit | Attending: Internal Medicine | Admitting: Internal Medicine

## 2021-01-01 ENCOUNTER — Encounter (HOSPITAL_COMMUNITY)
Admission: RE | Disposition: A | Discharge status: Home / Self Care | Payer: Self-pay | Source: Home / Self Care | Attending: Internal Medicine

## 2021-01-01 DIAGNOSIS — I081 Rheumatic disorders of both mitral and tricuspid valves: Secondary | ICD-10-CM | POA: Diagnosis not present

## 2021-01-01 DIAGNOSIS — E7800 Pure hypercholesterolemia, unspecified: Secondary | ICD-10-CM | POA: Diagnosis not present

## 2021-01-01 DIAGNOSIS — I079 Rheumatic tricuspid valve disease, unspecified: Secondary | ICD-10-CM | POA: Insufficient documentation

## 2021-01-01 DIAGNOSIS — Z881 Allergy status to other antibiotic agents status: Secondary | ICD-10-CM | POA: Insufficient documentation

## 2021-01-01 DIAGNOSIS — I313 Pericardial effusion (noninflammatory): Secondary | ICD-10-CM

## 2021-01-01 DIAGNOSIS — R5383 Other fatigue: Secondary | ICD-10-CM | POA: Diagnosis not present

## 2021-01-01 DIAGNOSIS — I078 Other rheumatic tricuspid valve diseases: Secondary | ICD-10-CM

## 2021-01-01 DIAGNOSIS — Z91013 Allergy to seafood: Secondary | ICD-10-CM | POA: Insufficient documentation

## 2021-01-01 DIAGNOSIS — Z8719 Personal history of other diseases of the digestive system: Secondary | ICD-10-CM | POA: Diagnosis not present

## 2021-01-01 DIAGNOSIS — I369 Nonrheumatic tricuspid valve disorder, unspecified: Secondary | ICD-10-CM

## 2021-01-01 DIAGNOSIS — Z880 Allergy status to penicillin: Secondary | ICD-10-CM | POA: Diagnosis not present

## 2021-01-01 DIAGNOSIS — Q211 Atrial septal defect: Secondary | ICD-10-CM | POA: Insufficient documentation

## 2021-01-01 DIAGNOSIS — Z79899 Other long term (current) drug therapy: Secondary | ICD-10-CM | POA: Diagnosis not present

## 2021-01-01 DIAGNOSIS — Z888 Allergy status to other drugs, medicaments and biological substances status: Secondary | ICD-10-CM | POA: Insufficient documentation

## 2021-01-01 HISTORY — PX: TEE WITHOUT CARDIOVERSION: SHX5443

## 2021-01-01 SURGERY — ECHOCARDIOGRAM, TRANSESOPHAGEAL
Anesthesia: Monitor Anesthesia Care

## 2021-01-01 MED ORDER — SODIUM CHLORIDE 0.9 % IV SOLN: INTRAVENOUS | Status: DC

## 2021-01-01 MED ORDER — APIXABAN (ELIQUIS) VTE STARTER PACK (10MG AND 5MG): ORAL_TABLET | ORAL | 0 refills | Status: DC

## 2021-01-01 MED ORDER — BUTAMBEN-TETRACAINE-BENZOCAINE 2-2-14 % EX AERO
INHALATION_SPRAY | CUTANEOUS | Status: DC | PRN
  Administered 2021-01-01: 2 via TOPICAL

## 2021-01-01 MED ORDER — PROPOFOL 500 MG/50ML IV EMUL
INTRAVENOUS | Status: DC | PRN
  Administered 2021-01-01: 200 ug/kg/min via INTRAVENOUS

## 2021-01-01 NOTE — Discharge Instructions (Signed)

## 2021-01-01 NOTE — Transfer of Care (Signed)
Immediate Anesthesia Transfer of Care Note  Patient: Claudia Ortiz  Procedure(s) Performed: TRANSESOPHAGEAL ECHOCARDIOGRAM (TEE)  Patient Location: Endoscopy Unit  Anesthesia Type:MAC  Level of Consciousness: awake  Airway & Oxygen Therapy: Patient Spontanous Breathing and Patient connected to face mask oxygen  Post-op Assessment: Report given to RN and Post -op Vital signs reviewed and stable  Post vital signs: Reviewed and stable  Last Vitals:  Vitals Value Taken Time  BP    Temp    Pulse    Resp    SpO2      Last Pain:  Vitals:   01/01/21 0932  TempSrc: Oral  PainSc: 0-No pain         Complications: No notable events documented.

## 2021-01-01 NOTE — Progress Notes (Signed)
Echocardiogram 2D Echocardiogram has been performed.  Oneal Deputy Marshell Dilauro 01/01/2021, 12:53 PM

## 2021-01-01 NOTE — Anesthesia Procedure Notes (Signed)
Procedure Name: MAC Date/Time: 01/01/2021 12:10 PM Performed by: Lieutenant Diego, CRNA Pre-anesthesia Checklist: Patient identified, Emergency Drugs available, Suction available, Patient being monitored and Timeout performed Patient Re-evaluated:Patient Re-evaluated prior to induction Oxygen Delivery Method: Simple face mask Preoxygenation: Pre-oxygenation with 100% oxygen Induction Type: IV induction

## 2021-01-01 NOTE — CV Procedure (Addendum)
INDICATIONS: Tricuspid valve mass  PROCEDURE:   Informed consent was obtained prior to the procedure. The risks, benefits and alternatives for the procedure were discussed and the patient comprehended these risks.  Risks include, but are not limited to, cough, sore throat, vomiting, nausea, somnolence, esophageal and stomach trauma or perforation, bleeding, low blood pressure, aspiration, pneumonia, infection, trauma to the teeth and death.    After a procedural time-out, the oropharynx was anesthetized with 20% benzocaine spray.   During this procedure the patient was administered propofol per anesthesia.  The patient's heart rate, blood pressure, and oxygen saturation were monitored continuously during the procedure. The period of conscious sedation was 60 minutes, of which I was present face-to-face 100% of this time.  The transesophageal probe was inserted in the esophagus and stomach without difficulty and multiple views were obtained.  The patient was kept under observation until the patient left the procedure room.  The patient left the procedure room in stable condition.   Agitated microbubble saline contrast was not administered.  COMPLICATIONS:    There were no immediate complications.  FINDINGS:   FORMAL ECHOCARDIOGRAM REPORT PENDING Tricuspid valve mass likely represents thrombus, probable attachment point is the posterior leaflet of the tricuspid valve on the atrial surface. There is small mobile echodensity on the pulmonary valve on PA aspect. These findings in combination represent probable thrombus in setting of active malignancy.   RECOMMENDATIONS:    Consider trial of anticoagulation if thrombus suspected clinically.   Time Spent Directly with the Patient:  75 minutes   Elouise Munroe 01/01/2021, 12:29 PM

## 2021-01-01 NOTE — Anesthesia Preprocedure Evaluation (Addendum)
Anesthesia Evaluation  Patient identified by MRN, date of birth, ID band Patient awake    Reviewed: Allergy & Precautions, NPO status , Patient's Chart, lab work & pertinent test results  Airway Mallampati: III  TM Distance: >3 FB Neck ROM: Full    Dental  (+) Dental Advisory Given, Chipped,    Pulmonary asthma ,    Pulmonary exam normal breath sounds clear to auscultation       Cardiovascular Normal cardiovascular exam Rhythm:Regular Rate:Normal  1. Basal inferior hypokinesis. . Left ventricular ejection fraction, by  estimation, is 50 to 55%. The left ventricle has low normal function.  2. Right ventricular systolic function is normal. The right ventricular  size is normal. There is normal pulmonary artery systolic pressure.  3. Trivial mitral valve regurgitation.  4. 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 It measures 11 x 10 mm. Review of echo from Jan 2022 (the  only previous echo) the echodensity appears to be present there as well  though windows are not as good. Would  consider TEE to furhter evaluate.  5. The aortic valve is normal in structure. Aortic valve regurgitation is  not visualized.  6. The inferior vena cava is normal in size with greater than 50%  respiratory variability, suggesting right atrial pressure of 3 mmHg   Neuro/Psych PSYCHIATRIC DISORDERS Anxiety negative neurological ROS     GI/Hepatic negative GI ROS, Neg liver ROS,   Endo/Other  negative endocrine ROS  Renal/GU negative Renal ROS  negative genitourinary   Musculoskeletal negative musculoskeletal ROS (+)   Abdominal   Peds  Hematology negative hematology ROS (+)   Anesthesia Other Findings TV mass  Reproductive/Obstetrics                            Anesthesia Physical Anesthesia Plan  ASA: 2  Anesthesia Plan: MAC   Post-op Pain Management:     Induction: Intravenous  PONV Risk Score and Plan: Propofol infusion and Treatment may vary due to age or medical condition  Airway Management Planned: Natural Airway  Additional Equipment:   Intra-op Plan:   Post-operative Plan:   Informed Consent: I have reviewed the patients History and Physical, chart, labs and discussed the procedure including the risks, benefits and alternatives for the proposed anesthesia with the patient or authorized representative who has indicated his/her understanding and acceptance.     Dental advisory given  Plan Discussed with: CRNA  Anesthesia Plan Comments:         Anesthesia Quick Evaluation

## 2021-01-01 NOTE — Progress Notes (Signed)
TEE today concerning for thrombus on tricuspid valve.  Recommend starting on anticoagulation with Eliquis 10 mg BID x1 week then reducing to 5 mg BID.  Would also check LE duplex to evaluate for DVT.  I spoke with patient, she is agreeable to starting anticoagulation.  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  She had been planned for surgery for her breast cancer this month, will discuss with her oncologist and surgeon, as would likely plan to anticoagulate for 3 months and then repeat echo to evaluate for resolution.

## 2021-01-01 NOTE — Interval H&P Note (Signed)
History and Physical Interval Note:  01/01/2021 11:10 AM  Claudia Ortiz  has presented today for surgery, with the diagnosis of TRICUSPID VALVE MASS.  The various methods of treatment have been discussed with the patient and family. After consideration of risks, benefits and other options for treatment, the patient has consented to  Procedure(s): TRANSESOPHAGEAL ECHOCARDIOGRAM (TEE) (N/A) as a surgical intervention.  The patient's history has been reviewed, patient examined, no change in status, stable for surgery.  I have reviewed the patient's chart and labs.  Questions were answered to the patient's satisfaction.     Elouise Munroe

## 2021-01-02 ENCOUNTER — Other Ambulatory Visit: Payer: Self-pay | Admitting: *Deleted

## 2021-01-02 ENCOUNTER — Ambulatory Visit (HOSPITAL_COMMUNITY)
Admission: RE | Admit: 2021-01-02 | Discharge: 2021-01-02 | Disposition: A | Discharge status: Home / Self Care | Payer: Medicare Other | Source: Ambulatory Visit | Attending: Internal Medicine | Admitting: Internal Medicine

## 2021-01-02 ENCOUNTER — Telehealth: Payer: Self-pay | Admitting: Cardiology

## 2021-01-02 DIAGNOSIS — I078 Other rheumatic tricuspid valve diseases: Secondary | ICD-10-CM

## 2021-01-02 DIAGNOSIS — R600 Localized edema: Secondary | ICD-10-CM

## 2021-01-02 NOTE — Telephone Encounter (Signed)
Called to schedule venous doppler ordered by Dr. Joylene John today 01/02/21 at 11:00 am---arrival time is 10:45 am for check in here at [facility] voiced her understanding.

## 2021-01-02 NOTE — Anesthesia Postprocedure Evaluation (Signed)
Anesthesia Post Note  Patient: Claudia Ortiz  Procedure(s) Performed: TRANSESOPHAGEAL ECHOCARDIOGRAM (TEE)     Patient location during evaluation: Endoscopy Anesthesia Type: MAC Level of consciousness: awake and alert Pain management: pain level controlled Vital Signs Assessment: post-procedure vital signs reviewed and stable Respiratory status: spontaneous breathing, nonlabored ventilation, respiratory function stable and patient connected to nasal cannula oxygen Cardiovascular status: blood pressure returned to baseline and stable Postop Assessment: no apparent nausea or vomiting Anesthetic complications: no   No notable events documented.  Last Vitals:  Vitals:   01/01/21 1244 01/01/21 1252  BP: 115/65 117/63  Pulse: 76 70  Resp: 17 18  Temp:    SpO2: 93%     Last Pain:  Vitals:   01/01/21 1252  TempSrc:   PainSc: 10-Worst pain ever                 Terin Dierolf L Grayson White

## 2021-01-02 NOTE — Telephone Encounter (Signed)
Patient requested to speak with Dr. Newman Nickels nurse regarding her upcoming appointment on 01/05/21 with Dr. Gardiner Rhyme. She declined going into detail, states she will discuss further when she speaks with the nurse.

## 2021-01-02 NOTE — Telephone Encounter (Signed)
Spoke to patient-she states she was unable to get Eliquis, the pharmacy states they did not have it (unsure if they did not have rx or starter pack).  She also is wondering if Dr. Gardiner Rhyme was able to get in touch with her surgeon and/or oncologist.    She does not believe the appt Monday 6/20 is needed this soon but wanted to check with Dr. Gardiner Rhyme.     Called pharmacy-she states they do not have starter packs and will not be able to get until Monday.   Advised to split into 2 prescriptions-1. Eliquis 10 mg BID x 7 days and 2. On day 8 Eliquis 5 mg BID.   Pharmacist verbalized understanding and can get it filled this way now so patient can get started.     Patient made aware.  Advised would discuss with Dr. Gardiner Rhyme for update and return call.   Per Dr. Myer Peer with surgeon and oncologist.  Patient aware and advised to keep appt Monday.

## 2021-01-03 ENCOUNTER — Encounter (HOSPITAL_COMMUNITY): Payer: Self-pay | Admitting: Internal Medicine

## 2021-01-05 ENCOUNTER — Ambulatory Visit (INDEPENDENT_AMBULATORY_CARE_PROVIDER_SITE_OTHER): Payer: Medicare Other | Admitting: Cardiology

## 2021-01-05 ENCOUNTER — Other Ambulatory Visit: Payer: Self-pay

## 2021-01-05 ENCOUNTER — Encounter: Payer: Self-pay | Admitting: Cardiology

## 2021-01-05 VITALS — Ht 65.5 in | Wt 186.4 lb

## 2021-01-05 DIAGNOSIS — I078 Other rheumatic tricuspid valve diseases: Secondary | ICD-10-CM | POA: Diagnosis not present

## 2021-01-05 DIAGNOSIS — Z01818 Encounter for other preprocedural examination: Secondary | ICD-10-CM

## 2021-01-05 DIAGNOSIS — Z01810 Encounter for preprocedural cardiovascular examination: Secondary | ICD-10-CM | POA: Diagnosis not present

## 2021-01-05 LAB — CULTURE, BLOOD (SINGLE)

## 2021-01-05 MED ORDER — ELIQUIS 5 MG PO TABS: 5.0000 mg | ORAL_TABLET | Freq: Two times a day (BID) | ORAL | 1 refills | Status: DC

## 2021-01-05 NOTE — Progress Notes (Signed)
Cardiology Office Note:    Date:  01/05/2021   ID:  Claudia Ortiz, Claudia Ortiz 1941/01/15, MRN [id number]  PCP:  Kelton Pillar, MD   [place] Providers Cardiologist:  None     Referring MD: Kelton Pillar, MD   No chief complaint on file. CC: Follow-up  History of Present Illness:    Claudia Ortiz is a 80 y.o. female with a hx of anxiety, elevated cholesterol, colon polyp, intraductal papilloma of left breast, and tricuspid valve mass who presents for follow-up.  She was refered by Dr. Laurann Montana for pre-op clearance for lumpectomy, initially seen on 12/23/2020.  Echocardiogram on 11/21/2020 showed EF 50 to 55%, normal RV function, echodensity on the tricuspid valve measuring 1 cm, mild TR. Transesophageal echocardiogram on 01/01/2021 showed 1.1 x 0.8 cm mass adherent to the atrial aspect of the posterior tricuspid valve leaflet, likely representing thrombus.  Also with small mobile echodensity on the pulmonary artery aspect of the pulmonary valve, likely representing thrombus.  EF 50%, normal RV function, small pericardial effusion, small PFO.  Lower extremity duplex on 01/02/2021 was negative.  Today, she is accompanied by her husband. Since last clinic visit, she is s/p TEE (6/16) revealing a mass (possible thrombus) on her tricuspid valve. We discussed this in detail and reviewed her options.  She does not endorse chest pain or shortness of breath. She has mild LE edema at her ankles.  For exercise, she continues to try to walk when she can. She recently began taking Eliquis, about 10 hours between doses. Currently there is no hematuria or blood in her stool. She denies any palpitations, or exertional symptoms. No headaches, lightheadedness, or syncope to report. Also has no orthopnea or PND.   Past Medical History:  Diagnosis Date   Anxiety    Asthma    As Child   Colon polyp    Elevated cholesterol    Intraductal papilloma of left breast     precancerous cells in R breast    Tricuspid valve mass    on 11/21/20 echo    Past Surgical History:  Procedure Laterality Date   CYST REMOVAL TRUNK     breast    FOOT SURGERY     PORTACATH PLACEMENT Left 09/02/2020   Procedure: INSERTION PORT-A-CATH;  Surgeon: Rolm Bookbinder, MD;  Location: ;  Service: General;  Laterality: Left;   TEE WITHOUT CARDIOVERSION N/A 01/01/2021   Procedure: TRANSESOPHAGEAL ECHOCARDIOGRAM (TEE);  Surgeon: Elouise Munroe, MD;  Location: [facility];  Service: Cardiology;  Laterality: N/A;   TONSILLECTOMY     as a child    Current Medications: Current Meds  Medication Sig   apixaban (ELIQUIS) 5 MG TABS tablet Take 1 tablet (5 mg total) by mouth 2 (two) times daily.   b complex vitamins capsule Take 1 capsule by mouth daily.   Biotin 5000 MCG CAPS Take 5,000 mcg by mouth daily.   carboxymethylcellulose (REFRESH PLUS) 0.5 % SOLN Place 1 drop into both eyes 3 (three) times daily as needed (dry eyes).   cholecalciferol (VITAMIN D3) 25 MCG (1000 UNIT) tablet Take 1,000 Units by mouth daily.   Evening Primrose Oil 500 MG CAPS Take 500 mg by mouth daily.   Garlic 1443 MG CAPS Take 1,000 mg by mouth daily.   Multiple Vitamin (MULTIVITAMIN) tablet Take 1 tablet by mouth daily.   Vitamin A 7.5 MG (25000 UT) CAPS Take 25,000 Units by mouth daily.   Zinc Oxide-Vitamin C (ZINC PLUS VITAMIN  C PO) Take 1 capsule by mouth daily.     Allergies:   Iodine, Shellfish allergy, Atorvastatin, Cephalosporins, and Penicillins   Social History   Socioeconomic History   Marital status: Married    Spouse name: Not on file   Number of children: Not on file   Years of education: Not on file   Highest education level: Not on file  Occupational History   Not on file  Tobacco Use   Smoking status: Never   Smokeless tobacco: Never  Substance and Sexual Activity   Alcohol use: No   Drug use: No   Sexual activity: Not on file  Other Topics Concern   Not on file  Social History  Narrative   Not on file   Social Determinants of Health   Financial Resource Strain: Not on file  Food Insecurity: Not on file  Transportation Needs: Not on file  Physical Activity: Not on file  Stress: Not on file  Social Connections: Not on file     Family History: The patient's family history includes Diabetes in her mother; Heart attack in her brother and mother; Heart disease in her father; Stroke in her father.  ROS:   Please see the history of present illness.    (+) Chest discomfort (+) Darkening fingernail beds (+) Mild bilateral LE edema, ankles All other systems reviewed and are negative.  EKGs/Labs/Other Studies Reviewed:    The following studies were reviewed today:  [place] LE Venous 01/02/2021: RIGHT:  - No evidence of deep vein thrombosis in the lower extremity. No indirect  evidence of obstruction proximal to the inguinal ligament.  - No cystic structure found in the popliteal fossa.     LEFT:  - No evidence of deep vein thrombosis in the lower extremity. No indirect  evidence of obstruction proximal to the inguinal ligament.  - No cystic structure found in the popliteal fossa.  - Small amount of superficial edema seen at left medial ankle.   TEE 01/01/2021:  1. There is a 1.1 x 0.8 cm mass that appears adherent to the atrial  aspect of the posterior tricuspid valve leaflet. In the setting of  malignancy, this mass likely represents thrombus. . The tricuspid valve is  abnormal.   2. There is a small mobile echodensity on the PA aspect of the pulmonary  valve. This mass likely represents thrombus given findings on tricuspid  valve. . The pulmonic valve was abnormal.   3. Left ventricular ejection fraction, by estimation, is 50%. The left  ventricle has low normal function.   4. Right ventricular systolic function is normal. The right ventricular  size is normal.   5. No left atrial/left atrial appendage thrombus was detected. The LAA  emptying velocity was  56 cm/s.   6. A small pericardial effusion is present. The pericardial effusion is  in the transverse sinus. There is a small amount of epicardial fat noted  in the transverse sinus freely mobile in pericardial fluid.   7. The mitral valve is normal in structure. Trivial mitral valve  regurgitation.   8. The aortic valve is grossly normal. There is mild calcification of the  aortic valve. Aortic valve regurgitation is not visualized. No aortic  stenosis is present.   9. Evidence of atrial level shunting detected by color flow Doppler.  There is a small patent foramen ovale with predominantly left to right  shunting across the atrial septum. Possible small fenestration in atrial  septum.   Comparison(s): Tricuspid  valve mass was present on the study from 11/21/20.  On review of TTE from 07/25/20, this mass may have been present at that time  as well, given similar location of echo density, however more subtle and  image quality did not optimize.   Conclusion(s)/Recommendation(s): Critical findings reported to Dr.  Gardiner Rhyme and acknowledged at 01/01/21 3:53 pm.   Echo 11/21/2020: 1. Basal inferior hypokinesis. . Left ventricular ejection fraction, by  estimation, is 50 to 55%. The left ventricle has low normal function.   2. Right ventricular systolic function is normal. The right ventricular  size is normal. There is normal pulmonary artery systolic pressure.   3. Trivial mitral valve regurgitation.   4. 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 It measures 11 x 10 mm. Review of echo from Jan 2022 (the only previous echo) the echodensity appears to be present there as well though windows are not as good. Would consider TEE to further evaluate.   5. The aortic valve is normal in structure. Aortic valve regurgitation is  not visualized.   6. The inferior vena cava is normal in size with greater than 50%  respiratory variability, suggesting right atrial  pressure of 3 mmHg.  Echo 07/25/2020: 1. Left ventricular ejection fraction, by estimation, is 55 to 60%. The  left ventricle has normal function. The left ventricle has no regional  wall motion abnormalities. Left ventricular diastolic parameters are  consistent with Grade I diastolic  dysfunction (impaired relaxation).   2. Right ventricular systolic function is normal. The right ventricular  size is normal.   3. The mitral valve is normal in structure. Trivial mitral valve  regurgitation. No evidence of mitral stenosis.   4. The aortic valve is tricuspid. Aortic valve regurgitation is not  visualized. No aortic stenosis is present.   Conclusion(s)/Recommendation(s): Normal biventricular function without evidence of hemodynamically significant valvular heart disease.  EKG:   01/05/2021: EKG is not ordered today. 12/23/2020: NSR, rate 89, Low voltage, Nonspecific T wave flattening  Recent Labs: 12/31/2020: ALT 18; BUN 14; Creatinine 0.82; Hemoglobin 11.6; Platelet Count 194; Potassium 4.0; Sodium 140  Recent Lipid Panel No results found for: CHOL, TRIG, HDL, CHOLHDL, VLDL, LDLCALC, LDLDIRECT    Physical Exam:    VS:  Ht 5' 5.5" (1.664 m)   Wt 186 lb 6.4 oz (84.6 kg)   BMI 30.55 kg/m     Wt Readings from Last 3 Encounters:  01/05/21 186 lb 6.4 oz (84.6 kg)  01/01/21 187 lb (84.8 kg)  12/31/20 187 lb 14.4 oz (85.2 kg)     GEN: Well nourished, well developed in no acute distress HEENT: Normal NECK: No JVD; No carotid bruits LYMPHATICS: No lymphadenopathy CARDIAC: RRR, no murmurs, rubs, gallops RESPIRATORY:  Clear to auscultation without rales, wheezing or rhonchi  ABDOMEN: Soft, non-tender, non-distended MUSCULOSKELETAL:  Trace LE edema; No deformity  SKIN: Warm and dry NEUROLOGIC:  Alert and oriented x 3 PSYCHIATRIC:  Normal affect   ASSESSMENT:    1. Thrombosis of tricuspid valve   2. Pre-op evaluation     PLAN:    Tricuspid valve mass: Echocardiogram on  11/21/2020 showed echodensity on the tricuspid valve measuring 1 cm, mild TR.  Transesophageal echocardiogram on 01/01/2021 showed 1.1 x 0.8 cm mass adherent to the atrial aspect of the posterior tricuspid valve leaflet, likely representing thrombus.  Also with small mobile echodensity on the pulmonary artery aspect of the pulmonary valve, likely representing thrombus.  EF 50%, normal RV function, small pericardial effusion, small PFO.  Lower extremity duplex on 01/02/2021 was negative for DVT -Blood cultures negative -TEE suggests TV/PV thrombus.  Recommend anticoagulation with Eliquis x3 months and then repeating echocardiogram to evaluate for resolution  Preop evaluation: Prior to lumpectomy for breast cancer.  No symptoms to suggest active cardiac condition.  Good functional capacity, greater than 4 METS.  RCRI score 0.   -Given TEE suggests TV/PV thrombus, recommend anticoagulation with Eliquis as above and repeating echocardiogram.  Discussed with patient's surgeon, Dr. Donne Hazel, and her oncologist, Dr Lindi Adie, in agreement with holding off on surgery x3 months.   RTC in 3 months  Medication Adjustments/Labs and Tests Ordered: Current medicines are reviewed at length with the patient today.  Concerns regarding medicines are outlined above.  Orders Placed This Encounter  Procedures   ECHOCARDIOGRAM COMPLETE    No orders of the defined types were placed in this encounter.   Patient Instructions  Medication Instructions:  Your physician recommends that you continue on your current medications as directed. Please refer to the Current Medication list given to you today.  *If you need a refill on your cardiac medications before your next appointment, please call your pharmacy*  Testing/Procedures: Your physician has requested that you have an echocardiogram (IN 3 MONTHS). Echocardiography is a painless test that uses sound waves to create images of your heart. It provides your doctor with  information about the size and shape of your heart and how well your heart's chambers and valves are working. This procedure takes approximately one hour. There are no restrictions for this procedure.  Follow-Up: At [facility], you and your health needs are our priority.  As part of our continuing mission to provide you with exceptional heart care, we have created designated Provider Care Teams.  These Care Teams include your primary Cardiologist (physician) and Advanced Practice Providers (APPs -  Physician Assistants and Nurse Practitioners) who all work together to provide you with the care you need, when you need it.  We recommend signing up for the patient portal called "MyChart".  Sign up information is provided on this After Visit Summary.  MyChart is used to connect with patients for Virtual Visits (Telemedicine).  Patients are able to view lab/test results, encounter notes, upcoming appointments, etc.  Non-urgent messages can be sent to your provider as well.   To learn more about what you can do with MyChart, go to [email or web address].    Your next appointment:   After echo with Dr. Bruna Potter [place] as a scribe for Donato Heinz, MD.,have documented all relevant documentation on the behalf of Donato Heinz, MD,as directed by  Donato Heinz, MD while in the presence of Donato Heinz, MD.  I, Donato Heinz, MD, have reviewed all documentation for this visit. The documentation on 01/05/21 for the exam, diagnosis, procedures, and orders are all accurate and complete.   Signed, Donato Heinz, MD  01/05/2021 12:16 PM    [facility]

## 2021-01-05 NOTE — Telephone Encounter (Signed)
56f, 85.2kg, scr 0.82 12/31/20, lovw/schumann 12/23/20

## 2021-01-05 NOTE — Patient Instructions (Signed)
Medication Instructions:  Your physician recommends that you continue on your current medications as directed. Please refer to the Current Medication list given to you today.  *If you need a refill on your cardiac medications before your next appointment, please call your pharmacy*  Testing/Procedures: Your physician has requested that you have an echocardiogram (IN 3 MONTHS). Echocardiography is a painless test that uses sound waves to create images of your heart. It provides your doctor with information about the size and shape of your heart and how well your heart's chambers and valves are working. This procedure takes approximately one hour. There are no restrictions for this procedure.  Follow-Up: At [facility], you and your health needs are our priority.  As part of our continuing mission to provide you with exceptional heart care, we have created designated Provider Care Teams.  These Care Teams include your primary Cardiologist (physician) and Advanced Practice Providers (APPs -  Physician Assistants and Nurse Practitioners) who all work together to provide you with the care you need, when you need it.  We recommend signing up for the patient portal called "MyChart".  Sign up information is provided on this After Visit Summary.  MyChart is used to connect with patients for Virtual Visits (Telemedicine).  Patients are able to view lab/test results, encounter notes, upcoming appointments, etc.  Non-urgent messages can be sent to your provider as well.   To learn more about what you can do with MyChart, go to [email or web address].    Your next appointment:   After echo with Dr. Gardiner Rhyme

## 2021-01-21 ENCOUNTER — Inpatient Hospital Stay: Payer: Medicare Other

## 2021-01-21 ENCOUNTER — Inpatient Hospital Stay: Payer: Medicare Other | Attending: Hematology and Oncology

## 2021-01-21 ENCOUNTER — Other Ambulatory Visit: Payer: Self-pay

## 2021-01-21 VITALS — BP 141/80 | HR 78 | Temp 98.4°F | Resp 16 | Wt 188.5 lb

## 2021-01-21 DIAGNOSIS — Z823 Family history of stroke: Secondary | ICD-10-CM | POA: Insufficient documentation

## 2021-01-21 DIAGNOSIS — Z95828 Presence of other vascular implants and grafts: Secondary | ICD-10-CM

## 2021-01-21 DIAGNOSIS — R5383 Other fatigue: Secondary | ICD-10-CM | POA: Insufficient documentation

## 2021-01-21 DIAGNOSIS — Z79899 Other long term (current) drug therapy: Secondary | ICD-10-CM | POA: Insufficient documentation

## 2021-01-21 DIAGNOSIS — Z170 Estrogen receptor positive status [ER+]: Secondary | ICD-10-CM | POA: Diagnosis not present

## 2021-01-21 DIAGNOSIS — N6315 Unspecified lump in the right breast, overlapping quadrants: Secondary | ICD-10-CM | POA: Insufficient documentation

## 2021-01-21 DIAGNOSIS — Z5112 Encounter for antineoplastic immunotherapy: Secondary | ICD-10-CM | POA: Insufficient documentation

## 2021-01-21 DIAGNOSIS — R079 Chest pain, unspecified: Secondary | ICD-10-CM | POA: Diagnosis not present

## 2021-01-21 DIAGNOSIS — Z833 Family history of diabetes mellitus: Secondary | ICD-10-CM | POA: Diagnosis not present

## 2021-01-21 DIAGNOSIS — Z8249 Family history of ischemic heart disease and other diseases of the circulatory system: Secondary | ICD-10-CM | POA: Insufficient documentation

## 2021-01-21 DIAGNOSIS — C50511 Malignant neoplasm of lower-outer quadrant of right female breast: Secondary | ICD-10-CM | POA: Insufficient documentation

## 2021-01-21 DIAGNOSIS — Z8719 Personal history of other diseases of the digestive system: Secondary | ICD-10-CM | POA: Insufficient documentation

## 2021-01-21 LAB — CBC WITH DIFFERENTIAL (CANCER CENTER ONLY)
Abs Immature Granulocytes: 0.01 10*3/uL (ref 0.00–0.07)
Basophils Absolute: 0.1 10*3/uL (ref 0.0–0.1)
Basophils Relative: 1 %
Eosinophils Absolute: 0.1 10*3/uL (ref 0.0–0.5)
Eosinophils Relative: 3 %
HCT: 36.8 % (ref 36.0–46.0)
Hemoglobin: 11.8 g/dL — ABNORMAL LOW (ref 12.0–15.0)
Immature Granulocytes: 0 %
Lymphocytes Relative: 40 %
Lymphs Abs: 1.7 10*3/uL (ref 0.7–4.0)
MCH: 27.4 pg (ref 26.0–34.0)
MCHC: 32.1 g/dL (ref 30.0–36.0)
MCV: 85.6 fL (ref 80.0–100.0)
Monocytes Absolute: 0.5 10*3/uL (ref 0.1–1.0)
Monocytes Relative: 11 %
Neutro Abs: 1.9 10*3/uL (ref 1.7–7.7)
Neutrophils Relative %: 45 %
Platelet Count: 209 10*3/uL (ref 150–400)
RBC: 4.3 MIL/uL (ref 3.87–5.11)
RDW: 13.3 % (ref 11.5–15.5)
WBC Count: 4.3 10*3/uL (ref 4.0–10.5)
nRBC: 0 % (ref 0.0–0.2)

## 2021-01-21 LAB — CMP (CANCER CENTER ONLY)
ALT: 17 U/L (ref 0–44)
AST: 25 U/L (ref 15–41)
Albumin: 3.5 g/dL (ref 3.5–5.0)
Alkaline Phosphatase: 88 U/L (ref 38–126)
Anion gap: 7 (ref 5–15)
BUN: 15 mg/dL (ref 8–23)
CO2: 27 mmol/L (ref 22–32)
Calcium: 9.4 mg/dL (ref 8.9–10.3)
Chloride: 108 mmol/L (ref 98–111)
Creatinine: 0.82 mg/dL (ref 0.44–1.00)
GFR, Estimated: >60 mL/min (ref >60)
Glucose, Bld: 88 mg/dL (ref 70–99)
Potassium: 4 mmol/L (ref 3.5–5.1)
Sodium: 142 mmol/L (ref 135–145)
Total Bilirubin: 0.5 mg/dL (ref 0.3–1.2)
Total Protein: 6.6 g/dL (ref 6.5–8.1)

## 2021-01-21 MED ORDER — HEPARIN SOD (PORK) LOCK FLUSH 100 UNIT/ML IV SOLN
500.0000 [IU] | Freq: Once | INTRAVENOUS | Status: AC | PRN
  Administered 2021-01-21: 500 [IU]
  Filled 2021-01-21: qty 5

## 2021-01-21 MED ORDER — DIPHENHYDRAMINE HCL 25 MG PO CAPS
ORAL_CAPSULE | ORAL | Status: AC
  Filled 2021-01-21: qty 2

## 2021-01-21 MED ORDER — ACETAMINOPHEN 325 MG PO TABS
650.0000 mg | ORAL_TABLET | Freq: Once | ORAL | Status: AC
  Administered 2021-01-21: 650 mg via ORAL

## 2021-01-21 MED ORDER — DIPHENHYDRAMINE HCL 25 MG PO CAPS
50.0000 mg | ORAL_CAPSULE | Freq: Once | ORAL | Status: AC
  Administered 2021-01-21: 50 mg via ORAL

## 2021-01-21 MED ORDER — SODIUM CHLORIDE 0.9% FLUSH
10.0000 mL | Freq: Once | INTRAVENOUS | Status: AC
  Administered 2021-01-21: 10 mL
  Filled 2021-01-21: qty 10

## 2021-01-21 MED ORDER — SODIUM CHLORIDE 0.9% FLUSH
10.0000 mL | INTRAVENOUS | Status: DC | PRN
  Administered 2021-01-21: 10 mL
  Filled 2021-01-21: qty 10

## 2021-01-21 MED ORDER — SODIUM CHLORIDE 0.9 % IV SOLN
Freq: Once | INTRAVENOUS | Status: AC
  Filled 2021-01-21: qty 250

## 2021-01-21 MED ORDER — ACETAMINOPHEN 325 MG PO TABS
ORAL_TABLET | ORAL | Status: AC
  Filled 2021-01-21: qty 2

## 2021-01-21 MED ORDER — TRASTUZUMAB-ANNS CHEMO 150 MG IV SOLR
6.0000 mg/kg | Freq: Once | INTRAVENOUS | Status: AC
  Administered 2021-01-21: 504 mg via INTRAVENOUS
  Filled 2021-01-21: qty 24

## 2021-01-21 NOTE — Patient Instructions (Addendum)
[facility]  Discharge Instructions: Thank you for choosing [facility] to provide your oncology and hematology care.   If you have a lab appointment with the [facility], please go directly to the [facility] and check in at the registration area.   Wear comfortable clothing and clothing appropriate for easy access to any Portacath or PICC line.   We strive to give you quality time with your provider. You may need to reschedule your appointment if you arrive late (15 or more minutes).  Arriving late affects you and other patients whose appointments are after yours.  Also, if you miss three or more appointments without notifying the office, you may be dismissed from the clinic at the provider's discretion.      For prescription refill requests, have your pharmacy contact our office and allow 72 hours for refills to be completed.    Today you received the following chemotherapy and/or immunotherapy agents: trastuzumab-anns Calla Kicks)    To help prevent nausea and vomiting after your treatment, we encourage you to take your nausea medication as directed.  BELOW ARE SYMPTOMS THAT SHOULD BE REPORTED IMMEDIATELY: *FEVER GREATER THAN 100.4 F (38 C) OR HIGHER *CHILLS OR SWEATING *NAUSEA AND VOMITING THAT IS NOT CONTROLLED WITH YOUR NAUSEA MEDICATION *UNUSUAL SHORTNESS OF BREATH *UNUSUAL BRUISING OR BLEEDING *URINARY PROBLEMS (pain or burning when urinating, or frequent urination) *BOWEL PROBLEMS (unusual diarrhea, constipation, pain near the anus) TENDERNESS IN MOUTH AND THROAT WITH OR WITHOUT PRESENCE OF ULCERS (sore throat, sores in mouth, or a toothache) UNUSUAL RASH, SWELLING OR PAIN  UNUSUAL VAGINAL DISCHARGE OR ITCHING   Items with * indicate a potential emergency and should be followed up as soon as possible or go to the Emergency Department if any problems should occur.  Please show the CHEMOTHERAPY ALERT CARD or IMMUNOTHERAPY ALERT  CARD at check-in to the Emergency Department and triage nurse.  Should you have questions after your visit or need to cancel or reschedule your appointment, please contact [facility]  Dept: [phone]  and follow the prompts.  Office hours are 8:00 a.m. to 4:30 p.m. Monday - Friday. Please note that voicemails left after 4:00 p.m. may not be returned until the following business day.  We are closed weekends and major holidays. You have access to a nurse at all times for urgent questions. Please call the main number to the clinic Dept: [phone] and follow the prompts.   For any non-urgent questions, you may also contact your provider using MyChart. We now offer e-Visits for anyone 68 and older to request care online for non-urgent symptoms. For details visit [email or web address].   Also download the MyChart app! Go to the app store, search "MyChart", open the app, select [facility], and log in with your MyChart username and password.  Due to Covid, a mask is required upon entering the hospital/clinic. If you do not have a mask, one will be given to you upon arrival. For doctor visits, patients may have 1 support person aged 61 or older with them. For treatment visits, patients cannot have anyone with them due to current Covid guidelines and our immunocompromised population.

## 2021-01-23 ENCOUNTER — Encounter: Payer: Self-pay | Admitting: *Deleted

## 2021-01-23 ENCOUNTER — Telehealth: Payer: Self-pay | Admitting: *Deleted

## 2021-01-23 NOTE — Telephone Encounter (Signed)
Eliquis PAF paperwork faxed to [facility].  Mychart message sent to patient to make aware.

## 2021-02-05 DIAGNOSIS — E7800 Pure hypercholesterolemia, unspecified: Secondary | ICD-10-CM | POA: Diagnosis not present

## 2021-02-11 ENCOUNTER — Other Ambulatory Visit: Payer: Medicare Other

## 2021-02-11 ENCOUNTER — Ambulatory Visit: Payer: Medicare Other

## 2021-02-11 ENCOUNTER — Ambulatory Visit: Payer: Medicare Other | Admitting: Hematology and Oncology

## 2021-02-12 ENCOUNTER — Inpatient Hospital Stay (HOSPITAL_BASED_OUTPATIENT_CLINIC_OR_DEPARTMENT_OTHER): Payer: Medicare Other | Admitting: Adult Health

## 2021-02-12 ENCOUNTER — Encounter: Payer: Self-pay | Admitting: Adult Health

## 2021-02-12 ENCOUNTER — Inpatient Hospital Stay: Payer: Medicare Other

## 2021-02-12 ENCOUNTER — Other Ambulatory Visit: Payer: Self-pay

## 2021-02-12 VITALS — BP 141/58 | HR 85 | Temp 97.5°F | Resp 18 | Wt 186.9 lb

## 2021-02-12 DIAGNOSIS — R5383 Other fatigue: Secondary | ICD-10-CM | POA: Diagnosis not present

## 2021-02-12 DIAGNOSIS — C50511 Malignant neoplasm of lower-outer quadrant of right female breast: Secondary | ICD-10-CM

## 2021-02-12 DIAGNOSIS — Z170 Estrogen receptor positive status [ER+]: Secondary | ICD-10-CM | POA: Diagnosis not present

## 2021-02-12 DIAGNOSIS — Z5112 Encounter for antineoplastic immunotherapy: Secondary | ICD-10-CM | POA: Diagnosis not present

## 2021-02-12 DIAGNOSIS — Z95828 Presence of other vascular implants and grafts: Secondary | ICD-10-CM

## 2021-02-12 DIAGNOSIS — Z8719 Personal history of other diseases of the digestive system: Secondary | ICD-10-CM | POA: Diagnosis not present

## 2021-02-12 DIAGNOSIS — Z8249 Family history of ischemic heart disease and other diseases of the circulatory system: Secondary | ICD-10-CM | POA: Diagnosis not present

## 2021-02-12 DIAGNOSIS — Z79899 Other long term (current) drug therapy: Secondary | ICD-10-CM | POA: Diagnosis not present

## 2021-02-12 DIAGNOSIS — Z833 Family history of diabetes mellitus: Secondary | ICD-10-CM | POA: Diagnosis not present

## 2021-02-12 DIAGNOSIS — N6315 Unspecified lump in the right breast, overlapping quadrants: Secondary | ICD-10-CM | POA: Diagnosis not present

## 2021-02-12 DIAGNOSIS — R079 Chest pain, unspecified: Secondary | ICD-10-CM | POA: Diagnosis not present

## 2021-02-12 DIAGNOSIS — Z823 Family history of stroke: Secondary | ICD-10-CM | POA: Diagnosis not present

## 2021-02-12 LAB — CBC WITH DIFFERENTIAL (CANCER CENTER ONLY)
Abs Immature Granulocytes: 0.01 10*3/uL (ref 0.00–0.07)
Basophils Absolute: 0.1 10*3/uL (ref 0.0–0.1)
Basophils Relative: 1 %
Eosinophils Absolute: 0.1 10*3/uL (ref 0.0–0.5)
Eosinophils Relative: 3 %
HCT: 37 % (ref 36.0–46.0)
Hemoglobin: 12 g/dL (ref 12.0–15.0)
Immature Granulocytes: 0 %
Lymphocytes Relative: 35 %
Lymphs Abs: 1.6 10*3/uL (ref 0.7–4.0)
MCH: 27.1 pg (ref 26.0–34.0)
MCHC: 32.4 g/dL (ref 30.0–36.0)
MCV: 83.7 fL (ref 80.0–100.0)
Monocytes Absolute: 0.4 10*3/uL (ref 0.1–1.0)
Monocytes Relative: 9 %
Neutro Abs: 2.3 10*3/uL (ref 1.7–7.7)
Neutrophils Relative %: 52 %
Platelet Count: 193 10*3/uL (ref 150–400)
RBC: 4.42 MIL/uL (ref 3.87–5.11)
RDW: 13.1 % (ref 11.5–15.5)
WBC Count: 4.5 10*3/uL (ref 4.0–10.5)
nRBC: 0 % (ref 0.0–0.2)

## 2021-02-12 LAB — CMP (CANCER CENTER ONLY)
ALT: 17 U/L (ref 0–44)
AST: 28 U/L (ref 15–41)
Albumin: 3.6 g/dL (ref 3.5–5.0)
Alkaline Phosphatase: 86 U/L (ref 38–126)
Anion gap: 6 (ref 5–15)
BUN: 16 mg/dL (ref 8–23)
CO2: 27 mmol/L (ref 22–32)
Calcium: 10 mg/dL (ref 8.9–10.3)
Chloride: 108 mmol/L (ref 98–111)
Creatinine: 0.9 mg/dL (ref 0.44–1.00)
GFR, Estimated: >60 mL/min (ref >60)
Glucose, Bld: 122 mg/dL — ABNORMAL HIGH (ref 70–99)
Potassium: 3.8 mmol/L (ref 3.5–5.1)
Sodium: 141 mmol/L (ref 135–145)
Total Bilirubin: 0.6 mg/dL (ref 0.3–1.2)
Total Protein: 7.2 g/dL (ref 6.5–8.1)

## 2021-02-12 MED ORDER — SODIUM CHLORIDE 0.9% FLUSH
10.0000 mL | Freq: Once | INTRAVENOUS | Status: AC
  Administered 2021-02-12: 10 mL
  Filled 2021-02-12: qty 10

## 2021-02-12 MED ORDER — ACETAMINOPHEN 325 MG PO TABS
ORAL_TABLET | ORAL | Status: AC
  Filled 2021-02-12: qty 2

## 2021-02-12 MED ORDER — SODIUM CHLORIDE 0.9% FLUSH
10.0000 mL | INTRAVENOUS | Status: DC | PRN
  Administered 2021-02-12: 10 mL
  Filled 2021-02-12: qty 10

## 2021-02-12 MED ORDER — DIPHENHYDRAMINE HCL 25 MG PO CAPS
ORAL_CAPSULE | ORAL | Status: AC
  Filled 2021-02-12: qty 2

## 2021-02-12 MED ORDER — DIPHENHYDRAMINE HCL 25 MG PO CAPS
50.0000 mg | ORAL_CAPSULE | Freq: Once | ORAL | Status: AC
  Administered 2021-02-12: 25 mg via ORAL

## 2021-02-12 MED ORDER — HEPARIN SOD (PORK) LOCK FLUSH 100 UNIT/ML IV SOLN
500.0000 [IU] | Freq: Once | INTRAVENOUS | Status: AC | PRN
  Administered 2021-02-12: 500 [IU]
  Filled 2021-02-12: qty 5

## 2021-02-12 MED ORDER — TRASTUZUMAB-ANNS CHEMO 150 MG IV SOLR
6.0000 mg/kg | Freq: Once | INTRAVENOUS | Status: AC
  Administered 2021-02-12: 504 mg via INTRAVENOUS
  Filled 2021-02-12: qty 24

## 2021-02-12 MED ORDER — SODIUM CHLORIDE 0.9 % IV SOLN
Freq: Once | INTRAVENOUS | Status: AC
  Filled 2021-02-12: qty 250

## 2021-02-12 MED ORDER — ACETAMINOPHEN 325 MG PO TABS
650.0000 mg | ORAL_TABLET | Freq: Once | ORAL | Status: AC
  Administered 2021-02-12: 650 mg via ORAL

## 2021-02-12 NOTE — Patient Instructions (Signed)
[facility]  Discharge Instructions: Thank you for choosing [facility] to provide your oncology and hematology care.   If you have a lab appointment with the [facility], please go directly to the [facility] and check in at the registration area.   Wear comfortable clothing and clothing appropriate for easy access to any Portacath or PICC line.   We strive to give you quality time with your provider. You may need to reschedule your appointment if you arrive late (15 or more minutes).  Arriving late affects you and other patients whose appointments are after yours.  Also, if you miss three or more appointments without notifying the office, you may be dismissed from the clinic at the provider's discretion.      For prescription refill requests, have your pharmacy contact our office and allow 72 hours for refills to be completed.    Today you received the following chemotherapy and/or immunotherapy agents herceptin      To help prevent nausea and vomiting after your treatment, we encourage you to take your nausea medication as directed.  BELOW ARE SYMPTOMS THAT SHOULD BE REPORTED IMMEDIATELY: *FEVER GREATER THAN 100.4 F (38 C) OR HIGHER *CHILLS OR SWEATING *NAUSEA AND VOMITING THAT IS NOT CONTROLLED WITH YOUR NAUSEA MEDICATION *UNUSUAL SHORTNESS OF BREATH *UNUSUAL BRUISING OR BLEEDING *URINARY PROBLEMS (pain or burning when urinating, or frequent urination) *BOWEL PROBLEMS (unusual diarrhea, constipation, pain near the anus) TENDERNESS IN MOUTH AND THROAT WITH OR WITHOUT PRESENCE OF ULCERS (sore throat, sores in mouth, or a toothache) UNUSUAL RASH, SWELLING OR PAIN  UNUSUAL VAGINAL DISCHARGE OR ITCHING   Items with * indicate a potential emergency and should be followed up as soon as possible or go to the Emergency Department if any problems should occur.  Please show the CHEMOTHERAPY ALERT CARD or IMMUNOTHERAPY ALERT CARD at check-in to  the Emergency Department and triage nurse.  Should you have questions after your visit or need to cancel or reschedule your appointment, please contact [facility]  Dept: [phone]  and follow the prompts.  Office hours are 8:00 a.m. to 4:30 p.m. Monday - Friday. Please note that voicemails left after 4:00 p.m. may not be returned until the following business day.  We are closed weekends and major holidays. You have access to a nurse at all times for urgent questions. Please call the main number to the clinic Dept: [phone] and follow the prompts.   For any non-urgent questions, you may also contact your provider using MyChart. We now offer e-Visits for anyone 18 and older to request care online for non-urgent symptoms. For details visit [email or web address].   Also download the MyChart app! Go to the app store, search "MyChart", open the app, select [email or web address], and log in with your MyChart username and password.  Due to Covid, a mask is required upon entering the hospital/clinic. If you do not have a mask, one will be given to you upon arrival. For doctor visits, patients may have 1 support person aged 18 or older with them. For treatment visits, patients cannot have anyone with them due to current Covid guidelines and our immunocompromised population.   

## 2021-02-12 NOTE — Assessment & Plan Note (Addendum)
07/14/2020:Screening mammogram showed a 1.5cm right breast mass. US showed the 1.4cm mass at the 7 o'clock position, a 2.7cm mass at the 9 o'clock position, and benign right axillary lymph nodes. Biopsy showed ALH at the 9 o'clock position, and at the 7 o'clock position, IDC, grade 3, HER-2 positive (3+), ER+ 20% weak, PR- 0%, Ki67 40%.  Treatment Plan: 1. Neoadjuvant chemotherapy withTaxol Herceptinfollowed by Herceptin maintenance for 1 year 2. Followed by breast conserving surgery if possible with sentinel lymph node study (on hold due atrial/tricuspid thrombus) 3. Followed by adjuvant radiation therapy if patient had lumpectomy 4.Followed by adjuvant antiestrogen therapy ------------------------------------------------------------------------------------------------------------------------------- Current treatment: Herceptin maintenance Echocardiogram May 2022: EF 50 to 55% Claudia Ortiz continues on treatment with Herceptin every 3 weeks.  She is tolerating this well.  She is taking Eliquis due to atrial/tricuspid thrombus and is tolerating this well.  She is due for repeat imaging on 04/07/2021 with echo  To evaluate the thrombus.  She meets with Dr. Chrissie Noa in September to discuss the role of surgery.    I reviewed the above with Claudia Ortiz in detail.  She will continue to return every 3 weeks for Herceptin and will see Dr. Lindi Adie in 6 weeks.

## 2021-02-12 NOTE — Progress Notes (Signed)
[facility] Follow up:    Kelton Pillar, MD [address] [place]  [id number]   DIAGNOSIS: Cancer Staging Malignant neoplasm of lower-outer quadrant of right breast of female, estrogen receptor positive ([place]) Staging form: Breast, AJCC 8th Edition - Clinical stage from 07/23/2020: Stage IA (cT1c, cN0, cM0, G3, ER+, PR-, HER2+) - Unsigned Stage prefix: Initial diagnosis Method of lymph node assessment: Clinical   SUMMARY OF ONCOLOGIC HISTORY: Oncology History  Malignant neoplasm of lower-outer quadrant of right breast of female, estrogen receptor positive ([place])  07/14/2020 Initial Diagnosis   Screening mammogram showed a 1.5cm right breast mass. US showed the 1.4cm mass at the 7 o'clock position, a 2.7cm mass at the 9 o'clock position, and benign right axillary lymph nodes. Biopsy showed ALH at the 9 o'clock position, and at the 7 o'clock position, IDC, grade 3, HER-2 positive (3+), ER+ 20% weak, PR- 0%, Ki67 40%.   09/03/2020 - 11/19/2020 Neo-Adjuvant Chemotherapy   Weekly Taxol (dose reduced) and Herceptin x 12   12/10/2020 -  Chemotherapy      Patient is on Antibody Plan: BREAST TRASTUZUMAB Q21D       CURRENT THERAPY:  INTERVAL HISTORY: Claudia Ortiz 80 y.o. female returns for    Patient Active Problem List   Diagnosis Date Noted   Tricuspid valve mass    Port-A-Cath in place 09/10/2020   Malignant neoplasm of lower-outer quadrant of right breast of female, estrogen receptor positive ([place]) 07/14/2020   Dyspnea 02/18/2014   Fatigue 02/18/2014   Abnormal ECG 02/18/2014   History of left breast cancer 02/18/2014   Hyperlipidemia 02/18/2014    is allergic to iodine, shellfish allergy, atorvastatin, cephalosporins, and penicillins.  MEDICAL HISTORY: Past Medical History:  Diagnosis Date   Anxiety    Asthma    As Child   Colon polyp    Elevated cholesterol    Intraductal papilloma of left breast     precancerous cells in R  breast   Tricuspid valve mass    on 11/21/20 echo    SURGICAL HISTORY: Past Surgical History:  Procedure Laterality Date   CYST REMOVAL TRUNK     breast    FOOT SURGERY     PORTACATH PLACEMENT Left 09/02/2020   Procedure: INSERTION PORT-A-CATH;  Surgeon: Rolm Bookbinder, MD;  Location: [facility];  Service: General;  Laterality: Left;   TEE WITHOUT CARDIOVERSION N/A 01/01/2021   Procedure: TRANSESOPHAGEAL ECHOCARDIOGRAM (TEE);  Surgeon: Elouise Munroe, MD;  Location: [facility];  Service: Cardiology;  Laterality: N/A;   TONSILLECTOMY     as a child    SOCIAL HISTORY: Social History   Socioeconomic History   Marital status: Married    Spouse name: Not on file   Number of children: Not on file   Years of education: Not on file   Highest education level: Not on file  Occupational History   Not on file  Tobacco Use   Smoking status: Never   Smokeless tobacco: Never  Substance and Sexual Activity   Alcohol use: No   Drug use: No   Sexual activity: Not on file  Other Topics Concern   Not on file  Social History Narrative   Not on file   Social Determinants of Health   Financial Resource Strain: Not on file  Food Insecurity: Not on file  Transportation Needs: Not on file  Physical Activity: Not on file  Stress: Not on file  Social Connections: Not  on file  Intimate Partner Violence: Not on file    FAMILY HISTORY: Family History  Problem Relation Age of Onset   Diabetes Mother    Heart attack Mother    Stroke Father    Heart disease Father    Heart attack Brother     Review of Systems  Constitutional:  Positive for fatigue. Negative for appetite change, chills, fever and unexpected weight change.  HENT:   Negative for hearing loss, lump/mass and trouble swallowing.   Eyes:  Negative for eye problems and icterus.  Respiratory:  Negative for chest tightness, cough and shortness of breath.   Cardiovascular:  Positive for chest pain  (occasinal "zing" last week that happened three times). Negative for leg swelling and palpitations.  Gastrointestinal:  Negative for abdominal distention, abdominal pain, constipation, diarrhea, nausea and vomiting.  Endocrine: Negative for hot flashes.  Genitourinary:  Negative for difficulty urinating.   Musculoskeletal:  Negative for arthralgias.  Skin:  Negative for itching and rash.  Neurological:  Negative for dizziness, extremity weakness, headaches and numbness.  Hematological:  Negative for adenopathy. Does not bruise/bleed easily.  Psychiatric/Behavioral:  Negative for depression. The patient is not nervous/anxious.      PHYSICAL EXAMINATION  ECOG PERFORMANCE STATUS: 1 - Symptomatic but completely ambulatory  Vitals:   02/12/21 1015  BP: (!) 141/58  Pulse: 85  Resp: 18  Temp: (!) 97.5 F (36.4 C)  SpO2: 98%    Physical Exam Constitutional:      General: She is not in acute distress.    Appearance: Normal appearance. She is not toxic-appearing.  HENT:     Head: Normocephalic and atraumatic.  Eyes:     General: No scleral icterus. Cardiovascular:     Rate and Rhythm: Normal rate and regular rhythm.     Pulses: Normal pulses.     Heart sounds: Normal heart sounds.  Pulmonary:     Effort: Pulmonary effort is normal.     Breath sounds: Normal breath sounds.     Comments: Bilateral breasts benign, no mass palpated Abdominal:     General: Abdomen is flat. Bowel sounds are normal. There is no distension.     Palpations: Abdomen is soft.     Tenderness: There is no abdominal tenderness.  Musculoskeletal:        General: No swelling.     Cervical back: Neck supple.  Lymphadenopathy:     Cervical: No cervical adenopathy.  Skin:    General: Skin is warm and dry.     Findings: No rash.  Neurological:     General: No focal deficit present.     Mental Status: She is alert.  Psychiatric:        Mood and Affect: Mood normal.        Behavior: Behavior normal.     LABORATORY DATA:  CBC    Component Value Date/Time   WBC 4.5 02/12/2021 0958   WBC 6.7 12/16/2020 1418   RBC 4.42 02/12/2021 0958   HGB 12.0 02/12/2021 0958   HGB 12.0 12/29/2020 0853   HGB 12.6 07/03/2009 1412   HCT 37.0 02/12/2021 0958   HCT 35.3 12/29/2020 0853   HCT 39.5 07/03/2009 1412   PLT 193 02/12/2021 0958   PLT 195 12/29/2020 0853   MCV 83.7 02/12/2021 0958   MCV 82 12/29/2020 0853   MCV 84 07/03/2009 1412   MCH 27.1 02/12/2021 0958   MCHC 32.4 02/12/2021 0958   RDW 13.1 02/12/2021 0958   RDW  12.9 12/29/2020 0853   RDW 12.7 07/03/2009 1412   LYMPHSABS 1.6 02/12/2021 0958   LYMPHSABS 2.6 07/03/2009 1412   MONOABS 0.4 02/12/2021 0958   EOSABS 0.1 02/12/2021 0958   EOSABS 0.2 07/03/2009 1412   BASOSABS 0.1 02/12/2021 0958   BASOSABS 0.0 07/03/2009 1412    CMP     Component Value Date/Time   NA 141 02/12/2021 0958   NA 142 12/24/2020 1204   NA 144 07/03/2009 1412   K 3.8 02/12/2021 0958   K 3.7 07/03/2009 1412   CL 108 02/12/2021 0958   CL 104 07/03/2009 1412   CO2 27 02/12/2021 0958   CO2 31 07/03/2009 1412   GLUCOSE 122 (H) 02/12/2021 0958   GLUCOSE 119 (H) 07/03/2009 1412   BUN 16 02/12/2021 0958   BUN 15 12/24/2020 1204   BUN 16 07/03/2009 1412   CREATININE 0.90 02/12/2021 0958   CREATININE 0.7 07/03/2009 1412   CALCIUM 10.0 02/12/2021 0958   CALCIUM 9.5 07/03/2009 1412   PROT 7.2 02/12/2021 0958   PROT 7.0 07/03/2009 1412   ALBUMIN 3.6 02/12/2021 0958   ALBUMIN 3.5 07/03/2009 1412   AST 28 02/12/2021 0958   ALT 17 02/12/2021 0958   ALT 42 07/03/2009 1412   ALKPHOS 86 02/12/2021 0958   ALKPHOS 92 (H) 07/03/2009 1412   BILITOT 0.6 02/12/2021 0958   GFRNONAA >60 02/12/2021 0958   GFRAA  05/02/2008 1405    >60        The eGFR has been calculated using the MDRD equation. This calculation has not been validated in all clinical          ASSESSMENT and THERAPY PLAN:   Malignant neoplasm of lower-outer quadrant of right breast  of female, estrogen receptor positive ([place]) 07/14/2020:Screening mammogram showed a 1.5cm right breast mass. US showed the 1.4cm mass at the 7 o'clock position, a 2.7cm mass at the 9 o'clock position, and benign right axillary lymph nodes. Biopsy showed ALH at the 9 o'clock position, and at the 7 o'clock position, IDC, grade 3, HER-2 positive (3+), ER+ 20% weak, PR- 0%, Ki67 40%.   Treatment Plan: 1. Neoadjuvant chemotherapy with Taxol Herceptin followed by Herceptin maintenance for 1 year 2. Followed by breast conserving surgery if possible with sentinel lymph node study (on hold due atrial/tricuspid thrombus) 3. Followed by adjuvant radiation therapy if patient had lumpectomy 4.  Followed by adjuvant antiestrogen therapy ------------------------------------------------------------------------------------------------------------------------------- Current treatment: Herceptin maintenance Echocardiogram May 2022: EF 50 to 55% Anina continues on treatment with Herceptin every 3 weeks.  She is tolerating this well.  She is taking Eliquis due to atrial/tricuspid thrombus and is tolerating this well.  She is due for repeat imaging on 04/07/2021 with echo  To evaluate the thrombus.  She meets with Dr. Chrissie Noa in September to discuss the role of surgery.    I reviewed the above with Kinisha in detail.  She will continue to return every 3 weeks for Herceptin and will see Dr. Lindi Adie in 6 weeks.      She knows to call for any questions that may arise between now and her next appointment.  We are happy to see her sooner if needed.  Total encounter time: 30 minutes in face to face visit time, chart review, lab review, order entry, care coordination and documentation of the encounter.  Wilber Bihari, NP 02/12/21 11:08 AM Medical Oncology and Hematology [facility] [place], [place] [address] Tel. [phone]    Fax.  [phone]  .[email or web address]

## 2021-03-03 ENCOUNTER — Encounter: Payer: Self-pay | Admitting: *Deleted

## 2021-03-04 ENCOUNTER — Inpatient Hospital Stay: Payer: Medicare Other

## 2021-03-04 ENCOUNTER — Other Ambulatory Visit: Payer: Self-pay

## 2021-03-04 ENCOUNTER — Inpatient Hospital Stay: Payer: Medicare Other | Attending: Hematology and Oncology

## 2021-03-04 VITALS — BP 123/93 | HR 77 | Temp 98.6°F | Resp 18 | Wt 187.1 lb

## 2021-03-04 DIAGNOSIS — Z5112 Encounter for antineoplastic immunotherapy: Secondary | ICD-10-CM | POA: Insufficient documentation

## 2021-03-04 DIAGNOSIS — Z170 Estrogen receptor positive status [ER+]: Secondary | ICD-10-CM | POA: Insufficient documentation

## 2021-03-04 DIAGNOSIS — C50511 Malignant neoplasm of lower-outer quadrant of right female breast: Secondary | ICD-10-CM | POA: Diagnosis not present

## 2021-03-04 DIAGNOSIS — Z95828 Presence of other vascular implants and grafts: Secondary | ICD-10-CM

## 2021-03-04 LAB — CMP (CANCER CENTER ONLY)
ALT: 20 U/L (ref 0–44)
AST: 21 U/L (ref 15–41)
Albumin: 3.6 g/dL (ref 3.5–5.0)
Alkaline Phosphatase: 90 U/L (ref 38–126)
Anion gap: 8 (ref 5–15)
BUN: 18 mg/dL (ref 8–23)
CO2: 26 mmol/L (ref 22–32)
Calcium: 9.5 mg/dL (ref 8.9–10.3)
Chloride: 108 mmol/L (ref 98–111)
Creatinine: 1.08 mg/dL — ABNORMAL HIGH (ref 0.44–1.00)
GFR, Estimated: 52 mL/min — ABNORMAL LOW (ref >60)
Glucose, Bld: 89 mg/dL (ref 70–99)
Potassium: 4 mmol/L (ref 3.5–5.1)
Sodium: 142 mmol/L (ref 135–145)
Total Bilirubin: 0.5 mg/dL (ref 0.3–1.2)
Total Protein: 6.5 g/dL (ref 6.5–8.1)

## 2021-03-04 LAB — CBC WITH DIFFERENTIAL (CANCER CENTER ONLY)
Abs Immature Granulocytes: 0.01 10*3/uL (ref 0.00–0.07)
Basophils Absolute: 0.1 10*3/uL (ref 0.0–0.1)
Basophils Relative: 1 %
Eosinophils Absolute: 0.2 10*3/uL (ref 0.0–0.5)
Eosinophils Relative: 4 %
HCT: 36.6 % (ref 36.0–46.0)
Hemoglobin: 11.7 g/dL — ABNORMAL LOW (ref 12.0–15.0)
Immature Granulocytes: 0 %
Lymphocytes Relative: 39 %
Lymphs Abs: 2.1 10*3/uL (ref 0.7–4.0)
MCH: 26.8 pg (ref 26.0–34.0)
MCHC: 32 g/dL (ref 30.0–36.0)
MCV: 83.8 fL (ref 80.0–100.0)
Monocytes Absolute: 0.5 10*3/uL (ref 0.1–1.0)
Monocytes Relative: 10 %
Neutro Abs: 2.4 10*3/uL (ref 1.7–7.7)
Neutrophils Relative %: 46 %
Platelet Count: 192 10*3/uL (ref 150–400)
RBC: 4.37 MIL/uL (ref 3.87–5.11)
RDW: 13.3 % (ref 11.5–15.5)
WBC Count: 5.2 10*3/uL (ref 4.0–10.5)
nRBC: 0 % (ref 0.0–0.2)

## 2021-03-04 MED ORDER — SODIUM CHLORIDE 0.9% FLUSH
10.0000 mL | INTRAVENOUS | Status: DC | PRN
Start: 2021-03-04 — End: 2021-03-04
  Administered 2021-03-04: 10 mL

## 2021-03-04 MED ORDER — SODIUM CHLORIDE 0.9 % IV SOLN: Freq: Once | INTRAVENOUS | Status: AC

## 2021-03-04 MED ORDER — TRASTUZUMAB-ANNS CHEMO 150 MG IV SOLR
6.0000 mg/kg | Freq: Once | INTRAVENOUS | Status: AC
  Administered 2021-03-04: 504 mg via INTRAVENOUS
  Filled 2021-03-04: qty 24

## 2021-03-04 MED ORDER — ACETAMINOPHEN 325 MG PO TABS
650.0000 mg | ORAL_TABLET | Freq: Once | ORAL | Status: AC
  Administered 2021-03-04: 650 mg via ORAL
  Filled 2021-03-04: qty 2

## 2021-03-04 MED ORDER — DIPHENHYDRAMINE HCL 25 MG PO CAPS
50.0000 mg | ORAL_CAPSULE | Freq: Once | ORAL | Status: AC
  Administered 2021-03-04: 25 mg via ORAL
  Filled 2021-03-04: qty 2

## 2021-03-04 MED ORDER — HEPARIN SOD (PORK) LOCK FLUSH 100 UNIT/ML IV SOLN
500.0000 [IU] | Freq: Once | INTRAVENOUS | Status: AC | PRN
  Administered 2021-03-04: 500 [IU]

## 2021-03-04 MED ORDER — SODIUM CHLORIDE 0.9% FLUSH
10.0000 mL | Freq: Once | INTRAVENOUS | Status: AC
  Administered 2021-03-04: 10 mL

## 2021-03-04 NOTE — Patient Instructions (Signed)
[facility] MEDICAL [facility]COLOG[facility]  Discharge Instructions: Thank you for choosing [facility] to provide your oncology and hematology care.   If you have a lab appointment with the Cancer Center, please go directly to the Cancer Center and check in at the registration area.   Wear comfortable clothing and clothing appropriate for easy access to any Portacath or PICC line.   We strive to give you quality time with your provider. You may need to reschedule your appointment if you arrive late (15 or more minutes).  Arriving late affects you and other patients whose appointments are after yours.  Also, if you miss three or more appointments without notifying the office, you may be dismissed from the clinic at the provider's discretion.      For prescription refill requests, have your pharmacy contact our office and allow 72 hours for refills to be completed.    Today you received the following chemotherapy and/or immunotherapy agents herceptin      To help prevent nausea and vomiting after your treatment, we encourage you to take your nausea medication as directed.  BELOW ARE SYMPTOMS THAT SHOULD BE REPORTED IMMEDIATELY: *FEVER GREATER THAN 100.4 F (38 C) OR HIGHER *CHILLS OR SWEATING *NAUSEA AND VOMITING THAT IS NOT CONTROLLED WITH YOUR NAUSEA MEDICATION *UNUSUAL SHORTNESS OF BREATH *UNUSUAL BRUISING OR BLEEDING *URINARY PROBLEMS (pain or burning when urinating, or frequent urination) *BOWEL PROBLEMS (unusual diarrhea, constipation, pain near the anus) TENDERNESS IN MOUTH AND THROAT WITH OR WITHOUT PRESENCE OF ULCERS (sore throat, sores in mouth, or a toothache) UNUSUAL RASH, SWELLING OR PAIN  UNUSUAL VAGINAL DISCHARGE OR ITCHING   Items with * indicate a potential emergency and should be followed up as soon as possible or go to the Emergency Department if any problems should occur.  Please show the CHEMOTHERAPY ALERT CARD or IMMUNOTHERAPY ALERT CARD at check-in to  the Emergency Department and triage nurse.  Should you have questions after your visit or need to cancel or reschedule your appointment, please contact [facility]  Dept: [phone]  and follow the prompts.  Office hours are 8:00 a.m. to 4:30 p.m. Monday - Friday. Please note that voicemails left after 4:00 p.m. may not be returned until the following business day.  We are closed weekends and major holidays. You have access to a nurse at all times for urgent questions. Please call the main number to the clinic Dept: [phone] and follow the prompts.   For any non-urgent questions, you may also contact your provider using MyChart. We now offer e-Visits for anyone 18 and older to request care online for non-urgent symptoms. For details visit [email or web address].   Also download the MyChart app! Go to the app store, search "MyChart", open the app, select [facility], and log in with your MyChart username and password.  Due to Covid, a mask is required upon entering the hospital/clinic. If you do not have a mask, one will be given to you upon arrival. For doctor visits, patients may have 1 support person aged 18 or older with them. For treatment visits, patients cannot have anyone with them due to current Covid guidelines and our immunocompromised population.   

## 2021-03-24 NOTE — Progress Notes (Signed)
Patient Care Team: Kelton Pillar, MD as PCP - General (Family Medicine) Rockwell Germany, RN as Nurse Navigator Tressie Ellis, Paulette Blanch, RN as Oncology Nurse Navigator Rolm Bookbinder, MD as Consulting Physician (General Surgery) Nicholas Lose, MD as Consulting Physician (Hematology and Oncology) Kyung Rudd, MD as Consulting Physician (Radiation Oncology)  DIAGNOSIS:    ICD-10-CM   1. Malignant neoplasm of lower-outer quadrant of right breast of female, estrogen receptor positive ([place])  C50.511    Z17.0       SUMMARY OF ONCOLOGIC HISTORY: Oncology History  Malignant neoplasm of lower-outer quadrant of right breast of female, estrogen receptor positive ([place])  07/14/2020 Initial Diagnosis   Screening mammogram showed a 1.5cm right breast mass. US showed the 1.4cm mass at the 7 o'clock position, a 2.7cm mass at the 9 o'clock position, and benign right axillary lymph nodes. Biopsy showed ALH at the 9 o'clock position, and at the 7 o'clock position, IDC, grade 3, HER-2 positive (3+), ER+ 20% weak, PR- 0%, Ki67 40%.   09/03/2020 - 11/19/2020 Neo-Adjuvant Chemotherapy   Weekly Taxol (dose reduced) and Herceptin x 12   12/10/2020 -  Chemotherapy      Patient is on Antibody Plan: BREAST TRASTUZUMAB Q21D       CHIEF COMPLIANT: Herceptin maintenance  INTERVAL HISTORY: Claudia Ortiz is a 80 y.o. with above-mentioned history of right breast cancer who completed neoadjuvant chemotherapy with Taxol Herceptin. She is currently on Herceptin maintenance therapy. She presents to the clinic today for treatment.  She is tolerating Herceptin extremely well without any problems or concerns.  She is anxious about the weight and would like [place] to help get the cardiologist appointment moved up closer to the echocardiogram.  ALLERGIES:  is allergic to iodine, shellfish allergy, atorvastatin, cephalosporins, and penicillins.  MEDICATIONS:  Current Outpatient Medications  Medication Sig Dispense Refill    apixaban (ELIQUIS) 5 MG TABS tablet Take 1 tablet (5 mg total) by mouth 2 (two) times daily. 180 tablet 1   atorvastatin (LIPITOR) 10 MG tablet Take 10 mg by mouth daily.     b complex vitamins capsule Take 1 capsule by mouth daily.     Biotin 5000 MCG CAPS Take 5,000 mcg by mouth daily.     carboxymethylcellulose (REFRESH PLUS) 0.5 % SOLN Place 1 drop into both eyes 3 (three) times daily as needed (dry eyes).     cholecalciferol (VITAMIN D3) 25 MCG (1000 UNIT) tablet Take 1,000 Units by mouth daily.     Evening Primrose Oil 500 MG CAPS Take 500 mg by mouth daily.     Garlic 7062 MG CAPS Take 1,000 mg by mouth daily.     Multiple Vitamin (MULTIVITAMIN) tablet Take 1 tablet by mouth daily.     Vitamin A 7.5 MG (25000 UT) CAPS Take 25,000 Units by mouth daily.     Zinc Oxide-Vitamin C (ZINC PLUS VITAMIN C PO) Take 1 capsule by mouth daily.     No current facility-administered medications for this visit.    PHYSICAL EXAMINATION: ECOG PERFORMANCE STATUS: 1 - Symptomatic but completely ambulatory  Vitals:   03/25/21 1127  BP: 140/67  Pulse: 83  Resp: 18  Temp: (!) 97.3 F (36.3 C)  SpO2: 98%   Filed Weights   03/25/21 1127  Weight: 186 lb 4.8 oz (84.5 kg)     LABORATORY DATA:  I have reviewed the data as listed CMP Latest Ref Rng & Units 03/04/2021 02/12/2021 01/21/2021  Glucose 70 - 99 mg/dL 89  122(H) 88  BUN 8 - 23 mg/dL $Remove'18 16 15  'SoWMGtp$ Creatinine 0.44 - 1.00 mg/dL 1.08(H) 0.90 0.82  Sodium 135 - 145 mmol/L 142 141 142  Potassium 3.5 - 5.1 mmol/L 4.0 3.8 4.0  Chloride 98 - 111 mmol/L 108 108 108  CO2 22 - 32 mmol/L $RemoveB'26 27 27  'YNadIQSk$ Calcium 8.9 - 10.3 mg/dL 9.5 10.0 9.4  Total Protein 6.5 - 8.1 g/dL 6.5 7.2 6.6  Total Bilirubin 0.3 - 1.2 mg/dL 0.5 0.6 0.5  Alkaline Phos 38 - 126 U/L 90 86 88  AST 15 - 41 U/L $Remo'21 28 25  'QVnOC$ ALT 0 - 44 U/L $Remo'20 17 17    'fgOPP$ Lab Results  Component Value Date   WBC 4.8 03/25/2021   HGB 12.1 03/25/2021   HCT 38.0 03/25/2021   MCV 83.2 03/25/2021   PLT 182  03/25/2021   NEUTROABS 2.0 03/25/2021    ASSESSMENT & PLAN:  Malignant neoplasm of lower-outer quadrant of right breast of female, estrogen receptor positive ([place]) 07/14/2020:Screening mammogram showed a 1.5cm right breast mass. US showed the 1.4cm mass at the 7 o'clock position, a 2.7cm mass at the 9 o'clock position, and benign right axillary lymph nodes. Biopsy showed ALH at the 9 o'clock position, and at the 7 o'clock position, IDC, grade 3, HER-2 positive (3+), ER+ 20% weak, PR- 0%, Ki67 40%.   Treatment Plan: 1. Neoadjuvant chemotherapy with Taxol Herceptin followed by Herceptin maintenance for 1 year 2. Followed by breast conserving surgery if possible with sentinel lymph node study (on hold due atrial/tricuspid thrombus) 3. Followed by adjuvant radiation therapy if patient had lumpectomy 4.  Followed by adjuvant antiestrogen therapy ------------------------------------------------------------------------------------------------------------------------------- Current treatment: Herceptin maintenance Echocardiogram May 2022: EF 50 to 55% (until January 2023)  Taking Eliquis due to atrial/tricuspid thrombus and is tolerating this well.  She is due for repeat imaging on 04/07/2021 with echo  To evaluate the thrombus.  She meets with Dr. Chrissie Noa in September to discuss the role of surgery.    She has an echocardiogram on 04/07/2021. We will request Dr. Nechama Guard to see her after the echocardiogram to evaluate her readiness for surgery. I would also like to check a mammogram and ultrasound to reassess her disease prior to the surgical decisions with Dr. Cathren Harsh.    She will continue to return every 3 weeks for Herceptin and every 6 weeks with follow-up with me.    No orders of the defined types were placed in this encounter.  The patient has a good understanding of the overall plan. she agrees with it. she will call with any problems that may develop before the next visit here.  Total  time spent: 30 mins including face to face time and time spent for planning, charting and coordination of care  Rulon Eisenmenger, MD, MPH 03/25/2021  I, Thana Ates, am acting as scribe for Dr. Nicholas Lose.  I have reviewed the above documentation for accuracy and completeness, and I agree with the above.

## 2021-03-25 ENCOUNTER — Inpatient Hospital Stay: Payer: Medicare Other | Attending: Hematology and Oncology

## 2021-03-25 ENCOUNTER — Inpatient Hospital Stay (HOSPITAL_BASED_OUTPATIENT_CLINIC_OR_DEPARTMENT_OTHER): Payer: Medicare Other | Admitting: Hematology and Oncology

## 2021-03-25 ENCOUNTER — Other Ambulatory Visit: Payer: Self-pay

## 2021-03-25 ENCOUNTER — Inpatient Hospital Stay: Payer: Medicare Other

## 2021-03-25 DIAGNOSIS — Z170 Estrogen receptor positive status [ER+]: Secondary | ICD-10-CM | POA: Diagnosis not present

## 2021-03-25 DIAGNOSIS — Z95828 Presence of other vascular implants and grafts: Secondary | ICD-10-CM

## 2021-03-25 DIAGNOSIS — Z5112 Encounter for antineoplastic immunotherapy: Secondary | ICD-10-CM | POA: Diagnosis not present

## 2021-03-25 DIAGNOSIS — Z7901 Long term (current) use of anticoagulants: Secondary | ICD-10-CM | POA: Insufficient documentation

## 2021-03-25 DIAGNOSIS — Z79899 Other long term (current) drug therapy: Secondary | ICD-10-CM | POA: Insufficient documentation

## 2021-03-25 DIAGNOSIS — C50511 Malignant neoplasm of lower-outer quadrant of right female breast: Secondary | ICD-10-CM

## 2021-03-25 DIAGNOSIS — Z888 Allergy status to other drugs, medicaments and biological substances status: Secondary | ICD-10-CM | POA: Insufficient documentation

## 2021-03-25 DIAGNOSIS — Z880 Allergy status to penicillin: Secondary | ICD-10-CM | POA: Insufficient documentation

## 2021-03-25 DIAGNOSIS — Z881 Allergy status to other antibiotic agents status: Secondary | ICD-10-CM | POA: Diagnosis not present

## 2021-03-25 LAB — CMP (CANCER CENTER ONLY)
ALT: 15 U/L (ref 0–44)
AST: 21 U/L (ref 15–41)
Albumin: 3.6 g/dL (ref 3.5–5.0)
Alkaline Phosphatase: 96 U/L (ref 38–126)
Anion gap: 10 (ref 5–15)
BUN: 16 mg/dL (ref 8–23)
CO2: 24 mmol/L (ref 22–32)
Calcium: 9.6 mg/dL (ref 8.9–10.3)
Chloride: 108 mmol/L (ref 98–111)
Creatinine: 0.96 mg/dL (ref 0.44–1.00)
GFR, Estimated: 60 mL/min — ABNORMAL LOW (ref >60)
Glucose, Bld: 92 mg/dL (ref 70–99)
Potassium: 4 mmol/L (ref 3.5–5.1)
Sodium: 142 mmol/L (ref 135–145)
Total Bilirubin: 0.5 mg/dL (ref 0.3–1.2)
Total Protein: 6.7 g/dL (ref 6.5–8.1)

## 2021-03-25 LAB — CBC WITH DIFFERENTIAL (CANCER CENTER ONLY)
Abs Immature Granulocytes: 0.01 10*3/uL (ref 0.00–0.07)
Basophils Absolute: 0.1 10*3/uL (ref 0.0–0.1)
Basophils Relative: 1 %
Eosinophils Absolute: 0.2 10*3/uL (ref 0.0–0.5)
Eosinophils Relative: 3 %
HCT: 38 % (ref 36.0–46.0)
Hemoglobin: 12.1 g/dL (ref 12.0–15.0)
Immature Granulocytes: 0 %
Lymphocytes Relative: 44 %
Lymphs Abs: 2.1 10*3/uL (ref 0.7–4.0)
MCH: 26.5 pg (ref 26.0–34.0)
MCHC: 31.8 g/dL (ref 30.0–36.0)
MCV: 83.2 fL (ref 80.0–100.0)
Monocytes Absolute: 0.5 10*3/uL (ref 0.1–1.0)
Monocytes Relative: 10 %
Neutro Abs: 2 10*3/uL (ref 1.7–7.7)
Neutrophils Relative %: 42 %
Platelet Count: 182 10*3/uL (ref 150–400)
RBC: 4.57 MIL/uL (ref 3.87–5.11)
RDW: 14 % (ref 11.5–15.5)
WBC Count: 4.8 10*3/uL (ref 4.0–10.5)
nRBC: 0 % (ref 0.0–0.2)

## 2021-03-25 MED ORDER — TRASTUZUMAB-ANNS CHEMO 150 MG IV SOLR
6.0000 mg/kg | Freq: Once | INTRAVENOUS | Status: AC
  Administered 2021-03-25: 504 mg via INTRAVENOUS
  Filled 2021-03-25: qty 24

## 2021-03-25 MED ORDER — ACETAMINOPHEN 325 MG PO TABS
650.0000 mg | ORAL_TABLET | Freq: Once | ORAL | Status: AC
  Administered 2021-03-25: 650 mg via ORAL
  Filled 2021-03-25: qty 2

## 2021-03-25 MED ORDER — SODIUM CHLORIDE 0.9% FLUSH
10.0000 mL | INTRAVENOUS | Status: DC | PRN
Start: 2021-03-25 — End: 2021-03-25
  Administered 2021-03-25: 10 mL

## 2021-03-25 MED ORDER — HEPARIN SOD (PORK) LOCK FLUSH 100 UNIT/ML IV SOLN
500.0000 [IU] | Freq: Once | INTRAVENOUS | Status: AC | PRN
  Administered 2021-03-25: 500 [IU]

## 2021-03-25 MED ORDER — SODIUM CHLORIDE 0.9 % IV SOLN: Freq: Once | INTRAVENOUS | Status: AC

## 2021-03-25 MED ORDER — SODIUM CHLORIDE 0.9% FLUSH
10.0000 mL | Freq: Once | INTRAVENOUS | Status: AC
  Administered 2021-03-25: 10 mL

## 2021-03-25 MED ORDER — DIPHENHYDRAMINE HCL 25 MG PO CAPS
50.0000 mg | ORAL_CAPSULE | Freq: Once | ORAL | Status: AC
  Administered 2021-03-25: 50 mg via ORAL
  Filled 2021-03-25: qty 2

## 2021-03-25 NOTE — Assessment & Plan Note (Signed)
07/14/2020:Screening mammogram showed a 1.5cm right breast mass. US showed the 1.4cm mass at the 7 o'clock position, a 2.7cm mass at the 9 o'clock position, and benign right axillary lymph nodes. Biopsy showed ALH at the 9 o'clock position, and at the 7 o'clock position, IDC, grade 3, HER-2 positive (3+), ER+ 20% weak, PR- 0%, Ki67 40%.  Treatment Plan: 1. Neoadjuvant chemotherapy withTaxol Herceptinfollowed by Herceptin maintenance for 1 year 2. Followed by breast conserving surgery if possible with sentinel lymph node study (on hold due atrial/tricuspid thrombus) 3. Followed by adjuvant radiation therapy if patient had lumpectomy 4.Followed by adjuvant antiestrogen therapy ------------------------------------------------------------------------------------------------------------------------------- Current treatment: Herceptin maintenance Echocardiogram May 2022: EF 50 to 55%  Taking Eliquis due to atrial/tricuspid thrombus and is tolerating this well.  She is due for repeat imaging on 04/07/2021 with echo  To evaluate the thrombus.  She meets with Dr. Chrissie Noa in September to discuss the role of surgery.      She will continue to return every 3 weeks for Herceptin and will see Dr. Lindi Adie in 6 weeks.

## 2021-03-25 NOTE — Patient Instructions (Signed)
[facility] MEDICAL [facility]COLOG[facility]  Discharge Instructions: Thank you for choosing [facility] to provide your oncology and hematology care.   If you have a lab appointment with the Cancer Center, please go directly to the Cancer Center and check in at the registration area.   Wear comfortable clothing and clothing appropriate for easy access to any Portacath or PICC line.   We strive to give you quality time with your provider. You may need to reschedule your appointment if you arrive late (15 or more minutes).  Arriving late affects you and other patients whose appointments are after yours.  Also, if you miss three or more appointments without notifying the office, you may be dismissed from the clinic at the provider's discretion.      For prescription refill requests, have your pharmacy contact our office and allow 72 hours for refills to be completed.    Today you received the following chemotherapy and/or immunotherapy agents Kanjinti      To help prevent nausea and vomiting after your treatment, we encourage you to take your nausea medication as directed.  BELOW ARE SYMPTOMS THAT SHOULD BE REPORTED IMMEDIATELY: *FEVER GREATER THAN 100.4 F (38 C) OR HIGHER *CHILLS OR SWEATING *NAUSEA AND VOMITING THAT IS NOT CONTROLLED WITH YOUR NAUSEA MEDICATION *UNUSUAL SHORTNESS OF BREATH *UNUSUAL BRUISING OR BLEEDING *URINARY PROBLEMS (pain or burning when urinating, or frequent urination) *BOWEL PROBLEMS (unusual diarrhea, constipation, pain near the anus) TENDERNESS IN MOUTH AND THROAT WITH OR WITHOUT PRESENCE OF ULCERS (sore throat, sores in mouth, or a toothache) UNUSUAL RASH, SWELLING OR PAIN  UNUSUAL VAGINAL DISCHARGE OR ITCHING   Items with * indicate a potential emergency and should be followed up as soon as possible or go to the Emergency Department if any problems should occur.  Please show the CHEMOTHERAPY ALERT CARD or IMMUNOTHERAPY ALERT CARD at check-in to  the Emergency Department and triage nurse.  Should you have questions after your visit or need to cancel or reschedule your appointment, please contact [facility]  Dept: [phone]  and follow the prompts.  Office hours are 8:00 a.m. to 4:30 p.m. Monday - Friday. Please note that voicemails left after 4:00 p.m. may not be returned until the following business day.  We are closed weekends and major holidays. You have access to a nurse at all times for urgent questions. Please call the main number to the clinic Dept: [phone] and follow the prompts.   For any non-urgent questions, you may also contact your provider using MyChart. We now offer e-Visits for anyone 18 and older to request care online for non-urgent symptoms. For details visit [email or web address].   Also download the MyChart app! Go to the app store, search "MyChart", open the app, select [place] [facility], and log in with your MyChart username and password.  Due to Covid, a mask is required upon entering the hospital/clinic. If you do not have a mask, one will be given to you upon arrival. For doctor visits, patients may have 1 support person aged 18 or older with them. For treatment visits, patients cannot have anyone with them due to current Covid guidelines and our immunocompromised population.   Trastuzumab injection for infusion What is this medication? TRASTUZUMAB (tras TOO zoo mab) is a monoclonal antibody. It is used to treat breast cancer and stomach cancer. This medicine may be used for other purposes; ask your health care provider or pharmacist if you have questions. COMMON BRAND NAME(S):   Herceptin, Herzuma, KANJINTI, Ogivri, Ontruzant, Trazimera What should I tell my care team before I take this medication? They need to know if you have any of these conditions: heart disease heart failure lung or breathing disease, like asthma an unusual or allergic reaction to trastuzumab, benzyl  alcohol, or other medications, foods, dyes, or preservatives pregnant or trying to get pregnant breast-feeding How should I use this medication? This drug is given as an infusion into a vein. It is administered in a hospital or clinic by a specially trained health care professional. Talk to your pediatrician regarding the use of this medicine in children. This medicine is not approved for use in children. Overdosage: If you think you have taken too much of this medicine contact a poison control center or emergency room at once. NOTE: This medicine is only for you. Do not share this medicine with others. What if I miss a dose? It is important not to miss a dose. Call your doctor or health care professional if you are unable to keep an appointment. What may interact with this medication? This medicine may interact with the following medications: certain types of chemotherapy, such as daunorubicin, doxorubicin, epirubicin, and idarubicin This list may not describe all possible interactions. Give your health care provider a list of all the medicines, herbs, non-prescription drugs, or dietary supplements you use. Also tell them if you smoke, drink alcohol, or use illegal drugs. Some items may interact with your medicine. What should I watch for while using this medication? Visit your doctor for checks on your progress. Report any side effects. Continue your course of treatment even though you feel ill unless your doctor tells you to stop. Call your doctor or health care professional for advice if you get a fever, chills or sore throat, or other symptoms of a cold or flu. Do not treat yourself. Try to avoid being around people who are sick. You may experience fever, chills and shaking during your first infusion. These effects are usually mild and can be treated with other medicines. Report any side effects during the infusion to your health care professional. Fever and chills usually do not happen with  later infusions. Do not become pregnant while taking this medicine or for 7 months after stopping it. Women should inform their doctor if they wish to become pregnant or think they might be pregnant. Women of child-bearing potential will need to have a negative pregnancy test before starting this medicine. There is a potential for serious side effects to an unborn child. Talk to your health care professional or pharmacist for more information. Do not breast-feed an infant while taking this medicine or for 7 months after stopping it. Women must use effective birth control with this medicine. What side effects may I notice from receiving this medication? Side effects that you should report to your doctor or health care professional as soon as possible: allergic reactions like skin rash, itching or hives, swelling of the face, lips, or tongue chest pain or palpitations cough dizziness feeling faint or lightheaded, falls fever general ill feeling or flu-like symptoms signs of worsening heart failure like breathing problems; swelling in your legs and feet unusually weak or tired Side effects that usually do not require medical attention (report to your doctor or health care professional if they continue or are bothersome): bone pain changes in taste diarrhea joint pain nausea/vomiting weight loss This list may not describe all possible side effects. Call your doctor for medical advice about side effects.   You may report side effects to FDA at 1-800-FDA-1088. Where should I keep my medication? This drug is given in a hospital or clinic and will not be stored at home. NOTE: This sheet is a summary. It may not cover all possible information. If you have questions about this medicine, talk to your doctor, pharmacist, or health care provider.  2022 Elsevier/Gold Standard (2016-06-29 14:37:52)   

## 2021-03-31 ENCOUNTER — Telehealth: Payer: Self-pay | Admitting: Cardiology

## 2021-03-31 ENCOUNTER — Other Ambulatory Visit: Payer: Self-pay | Admitting: *Deleted

## 2021-03-31 MED ORDER — APIXABAN 5 MG PO TABS
5.0000 mg | ORAL_TABLET | Freq: Two times a day (BID) | ORAL | 0 refills | Status: DC
Start: 2021-03-31 — End: 2021-05-27

## 2021-03-31 NOTE — Telephone Encounter (Signed)
Patient calling the office for samples of medication:   1.  What medication and dosage are you requesting samples for?  Eliquis  2.  Are you currently out of this medication? Just  a few

## 2021-03-31 NOTE — Telephone Encounter (Signed)
OK TO SAMPLE ELIQUIS '5MG'$  BID BUT PLEASE GIVE PT SOME PATIENT ASSISTANCE FORMS.  Prescription sample request for Eliquis received. Last office visit:schumann 01/05/21 Scr:0.96 03/25/21 Age: [id number]

## 2021-04-07 ENCOUNTER — Ambulatory Visit (HOSPITAL_COMMUNITY): Payer: Medicare Other | Attending: Cardiology

## 2021-04-07 ENCOUNTER — Other Ambulatory Visit (HOSPITAL_COMMUNITY): Payer: Medicare Other

## 2021-04-07 ENCOUNTER — Other Ambulatory Visit: Payer: Self-pay

## 2021-04-07 DIAGNOSIS — I078 Other rheumatic tricuspid valve diseases: Secondary | ICD-10-CM

## 2021-04-07 DIAGNOSIS — R922 Inconclusive mammogram: Secondary | ICD-10-CM | POA: Diagnosis not present

## 2021-04-07 DIAGNOSIS — Z853 Personal history of malignant neoplasm of breast: Secondary | ICD-10-CM | POA: Diagnosis not present

## 2021-04-08 ENCOUNTER — Telehealth: Payer: Self-pay | Admitting: Cardiology

## 2021-04-08 DIAGNOSIS — C50511 Malignant neoplasm of lower-outer quadrant of right female breast: Secondary | ICD-10-CM | POA: Diagnosis not present

## 2021-04-08 DIAGNOSIS — Z170 Estrogen receptor positive status [ER+]: Secondary | ICD-10-CM | POA: Diagnosis not present

## 2021-04-08 NOTE — Telephone Encounter (Signed)
Spoke to patient, patient aware of results and recommendations.  Appt scheduled for next week.   Samples of Eliquis placed at front desk for patient as requested last week (see phone note).

## 2021-04-08 NOTE — Telephone Encounter (Signed)
Patient is requesting to speak with Dr. Newman Nickels nurse. She states it is regarding test results. I informed her Dr. Newman Nickels nurse is not in today--patient is alright to speak with anyone  covering if possible.

## 2021-04-09 NOTE — Telephone Encounter (Signed)
Returned call to pt she states that she spoke with [facility] Dr Newman Nickels nurse and she states that she was offered 2 appointments and to call back "early" today and and let Hayley know that she would like the appt on the 26th as she is having an infusion 9-28 and will be unable to make the option for the 28th. I do not see anything about this in the pt's chart messages. Informed pt that [facility] and Dr Gardiner Rhyme are not in the office today. She states that she "needs/wants the appt on the 26th scheduled today". Informed I cannot do that without Hayley's ok. She states "there has to be someone there that can approve this". Informed pt that I would discuss this with my supervisor and either her or myself will call her back with the decision. Verbalized understanding. Waiting for Hayley.

## 2021-04-09 NOTE — Telephone Encounter (Signed)
Patient is following up. She states this morning she noticed her appointment is scheduled for 09/28.  However, she states she was also offered an appointment on 09/26 and she would prefer to be seen then if possible. Please advise.

## 2021-04-09 NOTE — Telephone Encounter (Signed)
Discuss appt with Hayley she says OK to schedule 9-26 @ 1040. Appointment scheduled.  Informed pt that her appt has been scheduled.

## 2021-04-12 NOTE — Progress Notes (Signed)
Cardiology Office Note:    Date:  04/13/2021   ID:  Claudia Ortiz, DOB [birth year], MRN [id number]  PCP:  Claudia Pillar, MD   [facility] Providers Cardiologist:  None     Referring MD: Claudia Pillar, MD   CC: Tricuspid valve mass  History of Present Illness:    Claudia Ortiz is a 80 y.o. female with a hx of anxiety, elevated cholesterol, colon polyp, intraductal papilloma of left breast, and tricuspid valve mass who presents for follow-up.  She was refered by Dr. Laurann Ortiz for pre-op clearance for lumpectomy, initially seen on 12/23/2020.  Echocardiogram on 11/21/2020 showed EF 50 to 55%, normal RV function, echodensity on the tricuspid valve measuring 1 cm, mild TR. Transesophageal echocardiogram on 01/01/2021 showed 1.1 x 0.8 cm mass adherent to the atrial aspect of the posterior tricuspid valve leaflet, likely representing thrombus.  Also with small mobile echodensity on the pulmonary artery aspect of the pulmonary valve, likely representing thrombus.  EF 50%, normal RV function, small pericardial effusion, small PFO.  Lower extremity duplex on 01/02/2021 was negative.  Echocardiogram 04/07/2021 showed EF 35 to 40%, global hypokinesis, and grade 1 diastolic dysfunction, normal RV function, mild MR, unchanged tricuspid valve mass.  Since last clinic visit, she reports that she is doing well.  Denies any chest pain, dyspnea, lightheadedness, syncope, or palpitations.  Has noted some LE edema.    Past Medical History:  Diagnosis Date   Anxiety    Asthma    As Child   Colon polyp    Elevated cholesterol    Intraductal papilloma of left breast     precancerous cells in R breast   Tricuspid valve mass    on 11/21/20 echo    Past Surgical History:  Procedure Laterality Date   CYST REMOVAL TRUNK     breast    FOOT SURGERY     PORTACATH PLACEMENT Left 09/02/2020   Procedure: INSERTION PORT-A-CATH;  Surgeon: Rolm Bookbinder, MD;  Location: [place];  Service:  General;  Laterality: Left;   TEE WITHOUT CARDIOVERSION N/A 01/01/2021   Procedure: TRANSESOPHAGEAL ECHOCARDIOGRAM (TEE);  Surgeon: Elouise Munroe, MD;  Location: [place];  Service: Cardiology;  Laterality: N/A;   TONSILLECTOMY     as a child    Current Medications: Current Meds  Medication Sig   apixaban (ELIQUIS) 5 MG TABS tablet Take 1 tablet (5 mg total) by mouth 2 (two) times daily.   b complex vitamins capsule Take 1 capsule by mouth daily.   Biotin 5000 MCG CAPS Take 5,000 mcg by mouth daily.   cholecalciferol (VITAMIN D3) 25 MCG (1000 UNIT) tablet Take 1,000 Units by mouth daily.   Evening Primrose Oil 500 MG CAPS Take 500 mg by mouth daily.   Garlic 0383 MG CAPS Take 1,000 mg by mouth daily.   metoprolol succinate (TOPROL XL) 25 MG 24 hr tablet Take 1 tablet (25 mg total) by mouth daily.   Multiple Vitamin (MULTIVITAMIN) tablet Take 1 tablet by mouth daily.   Vitamin A 7.5 MG (25000 UT) CAPS Take 25,000 Units by mouth daily.     Allergies:   Iodine, Shellfish allergy, Atorvastatin, Cephalosporins, and Penicillins   Social History   Socioeconomic History   Marital status: Married    Spouse name: Not on file   Number of children: Not on file   Years of education: Not on file   Highest education level: Not on file  Occupational History   Not on  file  Tobacco Use   Smoking status: Never   Smokeless tobacco: Never  Substance and Sexual Activity   Alcohol use: No   Drug use: No   Sexual activity: Not on file  Other Topics Concern   Not on file  Social History Narrative   Not on file   Social Determinants of Health   Financial Resource Strain: Not on file  Food Insecurity: Not on file  Transportation Needs: Not on file  Physical Activity: Not on file  Stress: Not on file  Social Connections: Not on file     Family History: The patient's family history includes Diabetes in her mother; Heart attack in her brother and mother; Heart disease in her father;  Stroke in her father.  ROS:   Please see the history of present illness.     All other systems reviewed and are negative.  EKGs/Labs/Other Studies Reviewed:    The following studies were reviewed today:  [place] LE Venous 01/02/2021: RIGHT:  - No evidence of deep vein thrombosis in the lower extremity. No indirect  evidence of obstruction proximal to the inguinal ligament.  - No cystic structure found in the popliteal fossa.     LEFT:  - No evidence of deep vein thrombosis in the lower extremity. No indirect  evidence of obstruction proximal to the inguinal ligament.  - No cystic structure found in the popliteal fossa.  - Small amount of superficial edema seen at left medial ankle.   TEE 01/01/2021:  1. There is a 1.1 x 0.8 cm mass that appears adherent to the atrial  aspect of the posterior tricuspid valve leaflet. In the setting of  malignancy, this mass likely represents thrombus. . The tricuspid valve is  abnormal.   2. There is a small mobile echodensity on the PA aspect of the pulmonary  valve. This mass likely represents thrombus given findings on tricuspid  valve. . The pulmonic valve was abnormal.   3. Left ventricular ejection fraction, by estimation, is 50%. The left  ventricle has low normal function.   4. Right ventricular systolic function is normal. The right ventricular  size is normal.   5. No left atrial/left atrial appendage thrombus was detected. The LAA  emptying velocity was 56 cm/s.   6. A small pericardial effusion is present. The pericardial effusion is  in the transverse sinus. There is a small amount of epicardial fat noted  in the transverse sinus freely mobile in pericardial fluid.   7. The mitral valve is normal in structure. Trivial mitral valve  regurgitation.   8. The aortic valve is grossly normal. There is mild calcification of the  aortic valve. Aortic valve regurgitation is not visualized. No aortic  stenosis is present.   9. Evidence of  atrial level shunting detected by color flow Doppler.  There is a small patent foramen ovale with predominantly left to right  shunting across the atrial septum. Possible small fenestration in atrial  septum.   Comparison(s): Tricuspid valve mass was present on the study from 11/21/20.  On review of TTE from 07/25/20, this mass may have been present at that time  as well, given similar location of echo density, however more subtle and  image quality did not optimize.   Conclusion(s)/Recommendation(s): Critical findings reported to Dr.  Gardiner Rhyme and acknowledged at 01/01/21 3:53 pm.   Echo 11/21/2020: 1. Basal inferior hypokinesis. . Left ventricular ejection fraction, by  estimation, is 50 to 55%. The left ventricle has low normal  function.   2. Right ventricular systolic function is normal. The right ventricular  size is normal. There is normal pulmonary artery systolic pressure.   3. Trivial mitral valve regurgitation.   4. 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 It measures 11 x 10 mm. Review of echo from Jan 2022 (the only previous echo) the echodensity appears to be present there as well though windows are not as good. Would consider TEE to further evaluate.   5. The aortic valve is normal in structure. Aortic valve regurgitation is  not visualized.   6. The inferior vena cava is normal in size with greater than 50%  respiratory variability, suggesting right atrial pressure of 3 mmHg.  Echo 07/25/2020: 1. Left ventricular ejection fraction, by estimation, is 55 to 60%. The  left ventricle has normal function. The left ventricle has no regional  wall motion abnormalities. Left ventricular diastolic parameters are  consistent with Grade I diastolic  dysfunction (impaired relaxation).   2. Right ventricular systolic function is normal. The right ventricular  size is normal.   3. The mitral valve is normal in structure. Trivial mitral valve   regurgitation. No evidence of mitral stenosis.   4. The aortic valve is tricuspid. Aortic valve regurgitation is not  visualized. No aortic stenosis is present.   Conclusion(s)/Recommendation(s): Normal biventricular function without evidence of hemodynamically significant valvular heart disease.  EKG:   04/13/21: NSR, rate 82, poor R wave progression, Q waves III, aVF 01/05/2021: EKG is not ordered today. 12/23/2020: NSR, rate 89, Low voltage, Nonspecific T wave flattening  Recent Labs: 03/25/2021: ALT 15; BUN 16; Creatinine 0.96; Hemoglobin 12.1; Platelet Count 182; Potassium 4.0; Sodium 142  Recent Lipid Panel No results found for: CHOL, TRIG, HDL, CHOLHDL, VLDL, LDLCALC, LDLDIRECT    Physical Exam:    VS:  BP (!) 148/84 (BP Location: Left Arm, Patient Position: Sitting, Cuff Size: Normal)   Pulse 82   Resp 20   Ht 5\' 5"  (1.651 m)   Wt 188 lb (85.3 kg)   SpO2 96%   BMI 31.28 kg/m     Wt Readings from Last 3 Encounters:  04/13/21 188 lb (85.3 kg)  03/25/21 186 lb 4.8 oz (84.5 kg)  03/04/21 187 lb 1.6 oz (84.9 kg)     GEN: Well nourished, well developed in no acute distress HEENT: Normal NECK: No JVD; No carotid bruits LYMPHATICS: No lymphadenopathy CARDIAC: RRR, no murmurs, rubs, gallops RESPIRATORY:  Clear to auscultation without rales, wheezing or rhonchi  ABDOMEN: Soft, non-tender, non-distended MUSCULOSKELETAL:  Trace LE edema; No deformity  SKIN: Warm and dry NEUROLOGIC:  Alert and oriented x 3 PSYCHIATRIC:  Normal affect   ASSESSMENT:    1. Tricuspid valve mass   2. Chronic combined systolic and diastolic heart failure ([place])   3. Pre-op evaluation      PLAN:    Tricuspid valve mass: Echocardiogram on 11/21/2020 showed echodensity on the tricuspid valve measuring 1 cm, mild TR.  Transesophageal echocardiogram on 01/01/2021 showed 1.1 x 0.8 cm mass adherent to the atrial aspect of the posterior tricuspid valve leaflet, likely representing thrombus.  Also with  small mobile echodensity on the pulmonary artery aspect of the pulmonary valve, likely representing thrombus.  EF 50%, normal RV function, small pericardial effusion, small PFO.  Lower extremity duplex on 01/02/2021 was negative for DVT  Echocardiogram 04/07/2021 showed EF 35 to 40%, global hypokinesis, and grade 1 diastolic dysfunction, normal RV function,  mild MR, unchanged tricuspid valve mass. -Blood cultures negative -TEE suggests TV/PV thrombus.  Completed 3 months of anticoagulation, no change in mass.  Recommend cardiac MRI for further evaluation  Chronic combined systolic and diastolic heart failure: Echocardiogram 04/07/2021 showed EF 35 to 40%, global hypokinesis, and grade 1 diastolic dysfunction, normal RV function, mild MR, unchanged tricuspid valve mass. -Stress MRI for further evaluation of cardiomyopathy -Start Toprol-XL 25 mg daily  Preop evaluation: Prior to lumpectomy for breast cancer.  No symptoms to suggest active cardiac condition.  Good functional capacity, greater than 4 METS.  RCRI score 0.   -Given TEE suggests TV/PV thrombus, recommended anticoagulation with Eliquis as above and repeating echocardiogram.  Repeat echocardiogram shows persistent mass.  Will evaluate further with cardiac MRI as above  RTC in 1 month   Medication Adjustments/Labs and Tests Ordered: Current medicines are reviewed at length with the patient today.  Concerns regarding medicines are outlined above.  Orders Placed This Encounter  Procedures   MR CARDIAC STRESS TEST   EKG 12-Lead     Meds ordered this encounter  Medications   metoprolol succinate (TOPROL XL) 25 MG 24 hr tablet    Sig: Take 1 tablet (25 mg total) by mouth daily.    Dispense:  90 tablet    Refill:  3     Patient Instructions  Medication Instructions:  START metoprolol succinate (Toprol XL) 25 mg once daily  *If you need a refill on your cardiac medications before your next appointment, please call your  pharmacy*  Testing/Procedures: Cardiac Stress MRI at [place]: At [facility], you and your health needs are our priority.  As part of our continuing mission to provide you with exceptional heart care, we have created designated Provider Care Teams.  These Care Teams include your primary Cardiologist (physician) and Advanced Practice Providers (APPs -  Physician Assistants and Nurse Practitioners) who all work together to provide you with the care you need, when you need it.  We recommend signing up for the patient portal called "MyChart".  Sign up information is provided on this After Visit Summary.  MyChart is used to connect with patients for Virtual Visits (Telemedicine).  Patients are able to view lab/test results, encounter notes, upcoming appointments, etc.  Non-urgent messages can be sent to your provider as well.   To learn more about what you can do with MyChart, go to [email or web address].    Your next appointment:   Wednesday, November 9th at 10:40 AM with Dr. Gardiner Rhyme    Signed, Donato Heinz, MD  04/13/2021 11:43 AM    [place]

## 2021-04-13 ENCOUNTER — Encounter: Payer: Self-pay | Admitting: Cardiology

## 2021-04-13 ENCOUNTER — Ambulatory Visit (INDEPENDENT_AMBULATORY_CARE_PROVIDER_SITE_OTHER): Payer: Medicare Other | Admitting: Cardiology

## 2021-04-13 ENCOUNTER — Other Ambulatory Visit: Payer: Self-pay

## 2021-04-13 VITALS — BP 148/84 | HR 82 | Resp 20 | Ht 65.0 in | Wt 188.0 lb

## 2021-04-13 DIAGNOSIS — I078 Other rheumatic tricuspid valve diseases: Secondary | ICD-10-CM | POA: Diagnosis not present

## 2021-04-13 DIAGNOSIS — Z01818 Encounter for other preprocedural examination: Secondary | ICD-10-CM

## 2021-04-13 DIAGNOSIS — Z01810 Encounter for preprocedural cardiovascular examination: Secondary | ICD-10-CM | POA: Diagnosis not present

## 2021-04-13 DIAGNOSIS — I5042 Chronic combined systolic (congestive) and diastolic (congestive) heart failure: Secondary | ICD-10-CM | POA: Diagnosis not present

## 2021-04-13 MED ORDER — METOPROLOL SUCCINATE ER 25 MG PO TB24: 25.0000 mg | ORAL_TABLET | Freq: Every day | ORAL | 3 refills | Status: DC

## 2021-04-13 NOTE — Patient Instructions (Addendum)
Medication Instructions:  START metoprolol succinate (Toprol XL) 25 mg once daily  *If you need a refill on your cardiac medications before your next appointment, please call your pharmacy*  Testing/Procedures: Cardiac Stress MRI at [place]: At [facility], you and your health needs are our priority.  As part of our continuing mission to provide you with exceptional heart care, we have created designated Provider Care Teams.  These Care Teams include your primary Cardiologist (physician) and Advanced Practice Providers (APPs -  Physician Assistants and Nurse Practitioners) who all work together to provide you with the care you need, when you need it.  We recommend signing up for the patient portal called "MyChart".  Sign up information is provided on this After Visit Summary.  MyChart is used to connect with patients for Virtual Visits (Telemedicine).  Patients are able to view lab/test results, encounter notes, upcoming appointments, etc.  Non-urgent messages can be sent to your provider as well.   To learn more about what you can do with MyChart, go to [email or web address].    Your next appointment:   Wednesday, November 9th at 10:40 AM with Dr. Gardiner Rhyme

## 2021-04-15 ENCOUNTER — Telehealth (HOSPITAL_COMMUNITY): Payer: Self-pay | Admitting: Emergency Medicine

## 2021-04-15 ENCOUNTER — Inpatient Hospital Stay: Payer: Medicare Other

## 2021-04-15 ENCOUNTER — Ambulatory Visit: Payer: Medicare Other | Admitting: Cardiology

## 2021-04-15 ENCOUNTER — Other Ambulatory Visit: Payer: Self-pay

## 2021-04-15 ENCOUNTER — Encounter (HOSPITAL_COMMUNITY): Payer: Self-pay

## 2021-04-15 ENCOUNTER — Other Ambulatory Visit (HOSPITAL_COMMUNITY): Payer: Self-pay | Admitting: Emergency Medicine

## 2021-04-15 DIAGNOSIS — C50511 Malignant neoplasm of lower-outer quadrant of right female breast: Secondary | ICD-10-CM

## 2021-04-15 DIAGNOSIS — Z5112 Encounter for antineoplastic immunotherapy: Secondary | ICD-10-CM | POA: Diagnosis not present

## 2021-04-15 DIAGNOSIS — I078 Other rheumatic tricuspid valve diseases: Secondary | ICD-10-CM

## 2021-04-15 DIAGNOSIS — Z170 Estrogen receptor positive status [ER+]: Secondary | ICD-10-CM | POA: Diagnosis not present

## 2021-04-15 DIAGNOSIS — Z79899 Other long term (current) drug therapy: Secondary | ICD-10-CM | POA: Diagnosis not present

## 2021-04-15 DIAGNOSIS — Z01810 Encounter for preprocedural cardiovascular examination: Secondary | ICD-10-CM

## 2021-04-15 DIAGNOSIS — Z95828 Presence of other vascular implants and grafts: Secondary | ICD-10-CM

## 2021-04-15 DIAGNOSIS — Z880 Allergy status to penicillin: Secondary | ICD-10-CM | POA: Diagnosis not present

## 2021-04-15 DIAGNOSIS — Z881 Allergy status to other antibiotic agents status: Secondary | ICD-10-CM | POA: Diagnosis not present

## 2021-04-15 DIAGNOSIS — Z888 Allergy status to other drugs, medicaments and biological substances status: Secondary | ICD-10-CM | POA: Diagnosis not present

## 2021-04-15 DIAGNOSIS — Z7901 Long term (current) use of anticoagulants: Secondary | ICD-10-CM | POA: Diagnosis not present

## 2021-04-15 LAB — CBC WITH DIFFERENTIAL (CANCER CENTER ONLY)
Abs Immature Granulocytes: 0.02 10*3/uL (ref 0.00–0.07)
Basophils Absolute: 0.1 10*3/uL (ref 0.0–0.1)
Basophils Relative: 1 %
Eosinophils Absolute: 0.2 10*3/uL (ref 0.0–0.5)
Eosinophils Relative: 3 %
HCT: 37.6 % (ref 36.0–46.0)
Hemoglobin: 12.1 g/dL (ref 12.0–15.0)
Immature Granulocytes: 0 %
Lymphocytes Relative: 41 %
Lymphs Abs: 2.3 10*3/uL (ref 0.7–4.0)
MCH: 26.5 pg (ref 26.0–34.0)
MCHC: 32.2 g/dL (ref 30.0–36.0)
MCV: 82.5 fL (ref 80.0–100.0)
Monocytes Absolute: 0.6 10*3/uL (ref 0.1–1.0)
Monocytes Relative: 10 %
Neutro Abs: 2.5 10*3/uL (ref 1.7–7.7)
Neutrophils Relative %: 45 %
Platelet Count: 203 10*3/uL (ref 150–400)
RBC: 4.56 MIL/uL (ref 3.87–5.11)
RDW: 14.4 % (ref 11.5–15.5)
WBC Count: 5.6 10*3/uL (ref 4.0–10.5)
nRBC: 0 % (ref 0.0–0.2)

## 2021-04-15 LAB — CMP (CANCER CENTER ONLY)
ALT: 19 U/L (ref 0–44)
AST: 22 U/L (ref 15–41)
Albumin: 3.8 g/dL (ref 3.5–5.0)
Alkaline Phosphatase: 96 U/L (ref 38–126)
Anion gap: 10 (ref 5–15)
BUN: 18 mg/dL (ref 8–23)
CO2: 25 mmol/L (ref 22–32)
Calcium: 9.9 mg/dL (ref 8.9–10.3)
Chloride: 105 mmol/L (ref 98–111)
Creatinine: 0.89 mg/dL (ref 0.44–1.00)
GFR, Estimated: >60 mL/min (ref >60)
Glucose, Bld: 97 mg/dL (ref 70–99)
Potassium: 4.1 mmol/L (ref 3.5–5.1)
Sodium: 140 mmol/L (ref 135–145)
Total Bilirubin: 0.7 mg/dL (ref 0.3–1.2)
Total Protein: 6.9 g/dL (ref 6.5–8.1)

## 2021-04-15 MED ORDER — SODIUM CHLORIDE 0.9% FLUSH
10.0000 mL | Freq: Once | INTRAVENOUS | Status: AC
Start: 2021-04-15 — End: 2021-04-15
  Administered 2021-04-15: 10 mL

## 2021-04-15 MED ORDER — SODIUM CHLORIDE 0.9% FLUSH
10.0000 mL | Freq: Once | INTRAVENOUS | Status: AC
  Administered 2021-04-15: 10 mL

## 2021-04-15 MED ORDER — HEPARIN SOD (PORK) LOCK FLUSH 100 UNIT/ML IV SOLN
500.0000 [IU] | Freq: Once | INTRAVENOUS | Status: AC
Start: 2021-04-15 — End: 2021-04-15
  Administered 2021-04-15: 500 [IU]

## 2021-04-15 NOTE — Telephone Encounter (Signed)
Attempted to call patient regarding upcoming cardiac MR appointment. Left message on voicemail with name and callback number Patience Nuzzo RN Navigator Cardiac Imaging [facility] [phone] Office [phone] Cell  

## 2021-04-15 NOTE — Progress Notes (Signed)
Per Dr. Lindi Adie, hold treatment due to decreased EF from most recent echocardiogram. Patient verbalized understanding and will wait from further notice from her cardiologist.

## 2021-04-15 NOTE — Progress Notes (Signed)
EKG ordered for pre-stress CMR evaluation  Marchia Bond RN Navigator Cardiac Imaging [facility] [phone] Office  [phone] Cell

## 2021-04-15 NOTE — Patient Instructions (Signed)
Implanted Port Home Guide An implanted port is a device that is placed under the skin. It is usually placed in the chest. The device can be used to give IV medicine, to take blood, or for dialysis. You may have an implanted port if: You need IV medicine that would be irritating to the small veins in your hands or arms. You need IV medicines, such as antibiotics, for a long period of time. You need IV nutrition for a long period of time. You need dialysis. When you have a port, your health care provider can choose to use the port instead of veins in your arms for these procedures. You may have fewer limitations when using a port than you would if you used other types of long-term IVs, and you will likely be able to return to normal activities after your incision heals. An implanted port has two main parts: Reservoir. The reservoir is the part where a needle is inserted to give medicines or draw blood. The reservoir is round. After it is placed, it appears as a small, raised area under your skin. Catheter. The catheter is a thin, flexible tube that connects the reservoir to a vein. Medicine that is inserted into the reservoir goes into the catheter and then into the vein. How is my port accessed? To access your port: A numbing cream may be placed on the skin over the port site. Your health care provider will put on a mask and sterile gloves. The skin over your port will be cleaned carefully with a germ-killing soap and allowed to dry. Your health care provider will gently pinch the port and insert a needle into it. Your health care provider will check for a blood return to make sure the port is in the vein and is not clogged. If your port needs to remain accessed to get medicine continuously (constant infusion), your health care provider will place a clear bandage (dressing) over the needle site. The dressing and needle will need to be changed every week, or as told by your health care provider. What  is flushing? Flushing helps keep the port from getting clogged. Follow instructions from your health care provider about how and when to flush the port. Ports are usually flushed with saline solution or a medicine called heparin. The need for flushing will depend on how the port is used: If the port is only used from time to time to give medicines or draw blood, the port may need to be flushed: Before and after medicines have been given. Before and after blood has been drawn. As part of routine maintenance. Flushing may be recommended every 4-6 weeks. If a constant infusion is running, the port may not need to be flushed. Throw away any syringes in a disposal container that is meant for sharp items (sharps container). You can buy a sharps container from a pharmacy, or you can make one by using an empty hard plastic bottle with a cover. How long will my port stay implanted? The port can stay in for as long as your health care provider thinks it is needed. When it is time for the port to come out, a surgery will be done to remove it. The surgery will be similar to the procedure that was done to put the port in. Follow these instructions at home:  Flush your port as told by your health care provider. If you need an infusion over several days, follow instructions from your health care provider about how   to take care of your port site. Make sure you: Wash your hands with soap and water before you change your dressing. If soap and water are not available, use alcohol-based hand sanitizer. Change your dressing as told by your health care provider. Place any used dressings or infusion bags into a plastic bag. Throw that bag in the trash. Keep the dressing that covers the needle clean and dry. Do not get it wet. Do not use scissors or sharp objects near the tube. Keep the tube clamped, unless it is being used. Check your port site every day for signs of infection. Check for: Redness, swelling, or  pain. Fluid or blood. Pus or a bad smell. Protect the skin around the port site. Avoid wearing bra straps that rub or irritate the site. Protect the skin around your port from seat belts. Place a soft pad over your chest if needed. Bathe or shower as told by your health care provider. The site may get wet as long as you are not actively receiving an infusion. Return to your normal activities as told by your health care provider. Ask your health care provider what activities are safe for you. Carry a medical alert card or wear a medical alert bracelet at all times. This will let health care providers know that you have an implanted port in case of an emergency. Get help right away if: You have redness, swelling, or pain at the port site. You have fluid or blood coming from your port site. You have pus or a bad smell coming from the port site. You have a fever. Summary Implanted ports are usually placed in the chest for long-term IV access. Follow instructions from your health care provider about flushing the port and changing bandages (dressings). Take care of the area around your port by avoiding clothing that puts pressure on the area, and by watching for signs of infection. Protect the skin around your port from seat belts. Place a soft pad over your chest if needed. Get help right away if you have a fever or you have redness, swelling, pain, drainage, or a bad smell at the port site. This information is not intended to replace advice given to you by your health care provider. Make sure you discuss any questions you have with your health care provider. Document Revised: 09/24/2020 Document Reviewed: 11/19/2019 Elsevier Patient Education  2022 Elsevier Inc.  

## 2021-04-16 ENCOUNTER — Telehealth (HOSPITAL_COMMUNITY): Payer: Self-pay | Admitting: *Deleted

## 2021-04-16 NOTE — Telephone Encounter (Signed)
Reaching out to patient to offer assistance regarding upcoming cardiac imaging study; pt verbalizes understanding of appt date/time, parking situation and where to check in, and verified current allergies; name and call back number provided for further questions should they arise  Demonte Dobratz RN Navigator Cardiac Imaging [facility] [phone] office [phone] cell  

## 2021-04-17 ENCOUNTER — Ambulatory Visit (HOSPITAL_COMMUNITY)
Admission: RE | Admit: 2021-04-17 | Discharge: 2021-04-17 | Disposition: A | Discharge status: Home / Self Care | Payer: Medicare Other | Source: Ambulatory Visit | Attending: Cardiology | Admitting: Cardiology

## 2021-04-17 ENCOUNTER — Other Ambulatory Visit: Payer: Self-pay

## 2021-04-17 DIAGNOSIS — Z01810 Encounter for preprocedural cardiovascular examination: Secondary | ICD-10-CM | POA: Insufficient documentation

## 2021-04-17 DIAGNOSIS — I5042 Chronic combined systolic (congestive) and diastolic (congestive) heart failure: Secondary | ICD-10-CM | POA: Insufficient documentation

## 2021-04-17 DIAGNOSIS — I078 Other rheumatic tricuspid valve diseases: Secondary | ICD-10-CM | POA: Diagnosis not present

## 2021-04-17 MED ORDER — REGADENOSON 0.4 MG/5ML IV SOLN
INTRAVENOUS | Status: AC
  Administered 2021-04-17: 0.4 mg via INTRAVENOUS
  Filled 2021-04-17: qty 5

## 2021-04-17 MED ORDER — GADOBUTROL 1 MMOL/ML IV SOLN
10.0000 mL | Freq: Once | INTRAVENOUS | Status: AC | PRN
  Administered 2021-04-17: 10 mL via INTRAVENOUS

## 2021-04-17 MED ORDER — AMINOPHYLLINE 25 MG/ML IV SOLN
INTRAVENOUS | Status: AC
  Filled 2021-04-17: qty 10

## 2021-04-17 MED ORDER — REGADENOSON 0.4 MG/5ML IV SOLN: 0.4000 mg | Freq: Once | INTRAVENOUS | Status: AC

## 2021-04-17 NOTE — Progress Notes (Signed)
Lexiscan given - pt denies having adverse reaction, VS remain stable; verbalized okay to proceed with remaining test.  Marchia Bond RN Navigator Cardiac Imaging [facility] [phone] Office  [phone] Cell

## 2021-04-20 ENCOUNTER — Telehealth: Payer: Self-pay | Admitting: Cardiology

## 2021-04-20 NOTE — Telephone Encounter (Signed)
Spoke with patient, discussed results of stress MRI.  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  For right-sided papillary fibroelastoma, optimal management is unclear.  Surgical resection can be considered to reduce risk of pulmonary embolism or can monitor and consider resection if increasing in size.  She reports she is not interested in surgery at this time and would like to monitor.  Would be reasonable to continue Eliquis.  In regards to her preoperative evaluation prior to breast cancer surgery, her stress test showed no evidence of ischemia.  EF is mildly reduced (43%) but she appears well compensated.  No further cardiac work-up recommended prior to surgery.  Would recommend holding Eliquis 2 days prior to her procedure and restart once okay per her surgeon.  Donato Heinz, MD

## 2021-04-28 ENCOUNTER — Other Ambulatory Visit: Payer: Self-pay | Admitting: General Surgery

## 2021-04-28 ENCOUNTER — Ambulatory Visit: Payer: Medicare Other | Admitting: Cardiology

## 2021-04-28 ENCOUNTER — Encounter: Payer: Self-pay | Admitting: *Deleted

## 2021-04-28 DIAGNOSIS — C50511 Malignant neoplasm of lower-outer quadrant of right female breast: Secondary | ICD-10-CM

## 2021-04-29 ENCOUNTER — Encounter: Payer: Self-pay | Admitting: *Deleted

## 2021-05-02 DIAGNOSIS — Z23 Encounter for immunization: Secondary | ICD-10-CM | POA: Diagnosis not present

## 2021-05-04 NOTE — Progress Notes (Signed)
Surgical Instructions    Your procedure is scheduled on Tuesday October 25th.  Report to [facility] Main Entrance "A" at 7:30 A.M., then check in with the Admitting office.  Call this number if you have problems the morning of surgery:  [phone]   If you have any questions prior to your surgery date call [phone]: Open Monday-Friday 8am-4pm    Remember:  Do not eat after midnight the night before your surgery  You may drink clear liquids until 6:30am the morning of your surgery.   Clear liquids allowed are: Water, Non-Citrus Juices (without pulp), Carbonated Beverages, Clear Tea, Black Coffee ONLY (NO MILK, CREAM OR POWDERED CREAMER of any kind), and Gatorade   Enhanced Recovery after Surgery  Enhanced Recovery after Surgery is a protocol used to improve the stress on your body and your recovery after surgery.  Patient Instructions  The day of surgery (if you do NOT have diabetes):  Drink ONE (1) Pre-Surgery Clear Ensure by ___6:30__ am the morning of surgery   This drink was given to you during your hospital  pre-op appointment visit. Nothing else to drink after completing the  Pre-Surgery Clear Ensure.         If you have questions, please contact your surgeon's office.     Take these medicines the morning of surgery with A SIP OF WATER Apoaequorin (PREVAGEN) 10 MG CAPS metoprolol succinate (TOPROL XL) 25 MG 24 hr tablet    As of today, STOP taking any Aspirin (unless otherwise instructed by your surgeon) Aleve, Naproxen, Ibuprofen, Motrin, Advil, Goody's, BC's, all herbal medications, fish oil, and all vitamins.     After your COVID test   You are not required to quarantine however you are required to wear a well-fitting mask when you are out and around people not in your household.  If your mask becomes wet or soiled, replace with a new one.  Wash your hands often with soap and water for 20 seconds or clean your hands with an alcohol-based hand sanitizer  that contains at least 60% alcohol.  Do not share personal items.  Notify your provider: if you are in close contact with someone who has COVID  or if you develop a fever of 100.4 or greater, sneezing, cough, sore throat, shortness of breath or body aches.             Do not wear jewelry or makeup Do not wear lotions, powders, perfumes or deodorant. Do not shave 48 hours prior to surgery.   Do not bring valuables to the hospital. DO Not wear nail polish, gel polish, artificial nails, or any other type of covering on natural nails including finger and toenails. If patients have artificial nails, gel coating, etc. that need to be removed by a nail salon, please have this removed prior to surgery or surgery may need to be canceled/delayed if the surgeon/ anesthesia feels like the patient is unable to be adequately monitored.             [facility] is not responsible for any belongings or valuables.  Do NOT Smoke (Tobacco/Vaping)  24 hours prior to your procedure  If you use a CPAP at night, you may bring your mask for your overnight stay.   Contacts, glasses, hearing aids, dentures or partials may not be worn into surgery, please bring cases for these belongings   For patients admitted to the hospital, discharge time will be determined by your treatment team.   Patients discharged  the day of surgery will not be allowed to drive home, and someone needs to stay with them for 24 hours.  NO VISITORS WILL BE ALLOWED IN PRE-OP WHERE PATIENTS ARE PREPPED FOR SURGERY.  ONLY 1 SUPPORT PERSON MAY BE PRESENT IN THE WAITING ROOM WHILE YOU ARE IN SURGERY.  IF YOU ARE TO BE ADMITTED, ONCE YOU ARE IN YOUR ROOM YOU WILL BE ALLOWED TWO (2) VISITORS. 1 (ONE) VISITOR MAY STAY OVERNIGHT BUT MUST ARRIVE TO THE ROOM BY 8pm.  Minor children may have two parents present. Special consideration for safety and communication needs will be reviewed on a case by case basis.  Special instructions:    Oral Hygiene  is also important to reduce your risk of infection.  Remember - BRUSH YOUR TEETH THE MORNING OF SURGERY WITH YOUR REGULAR TOOTHPASTE   [facility]- Preparing For Surgery  Before surgery, you can play an important role. Because skin is not sterile, your skin needs to be as free of germs as possible. You can reduce the number of germs on your skin by washing with CHG (chlorahexidine gluconate) Soap before surgery.  CHG is an antiseptic cleaner which kills germs and bonds with the skin to continue killing germs even after washing.     Please do not use if you have an allergy to CHG or antibacterial soaps. If your skin becomes reddened/irritated stop using the CHG.  Do not shave (including legs and underarms) for at least 48 hours prior to first CHG shower. It is OK to shave your face.  Please follow these instructions carefully.     Shower the NIGHT BEFORE SURGERY and the MORNING OF SURGERY with CHG Soap.   If you chose to wash your hair, wash your hair first as usual with your normal shampoo. After you shampoo, rinse your hair and body thoroughly to remove the shampoo.  Then [employer] and genitals (private parts) with your normal soap and rinse thoroughly to remove soap.  After that Use CHG Soap as you would any other liquid soap. You can apply CHG directly to the skin and wash gently with a scrungie or a clean washcloth.   Apply the CHG Soap to your body ONLY FROM THE NECK DOWN.  Do not use on open wounds or open sores. Avoid contact with your eyes, ears, mouth and genitals (private parts). Wash Face and genitals (private parts)  with your normal soap.   Wash thoroughly, paying special attention to the area where your surgery will be performed.  Thoroughly rinse your body with warm water from the neck down.  DO NOT shower/wash with your normal soap after using and rinsing off the CHG Soap.  Pat yourself dry with a CLEAN TOWEL.  Wear CLEAN PAJAMAS to bed the night before surgery  Place  CLEAN SHEETS on your bed the night before your surgery  DO NOT SLEEP WITH PETS.   Day of Surgery:  Take a shower with CHG soap. Wear Clean/Comfortable clothing the morning of surgery Do not apply any deodorants/lotions.   Remember to brush your teeth WITH YOUR REGULAR TOOTHPASTE.   Please read over the following fact sheets that you were given.

## 2021-05-05 ENCOUNTER — Encounter (HOSPITAL_COMMUNITY)
Admission: RE | Admit: 2021-05-05 | Discharge: 2021-05-05 | Disposition: A | Discharge status: Home / Self Care | Payer: Medicare Other | Source: Ambulatory Visit | Attending: General Surgery | Admitting: General Surgery

## 2021-05-05 ENCOUNTER — Other Ambulatory Visit: Payer: Self-pay

## 2021-05-05 ENCOUNTER — Other Ambulatory Visit (HOSPITAL_COMMUNITY): Payer: Medicare Other

## 2021-05-05 ENCOUNTER — Encounter (HOSPITAL_COMMUNITY): Payer: Self-pay

## 2021-05-05 DIAGNOSIS — Z01812 Encounter for preprocedural laboratory examination: Secondary | ICD-10-CM | POA: Insufficient documentation

## 2021-05-05 LAB — CBC
HCT: 42.1 % (ref 36.0–46.0)
Hemoglobin: 13 g/dL (ref 12.0–15.0)
MCH: 26.1 pg (ref 26.0–34.0)
MCHC: 30.9 g/dL (ref 30.0–36.0)
MCV: 84.4 fL (ref 80.0–100.0)
Platelets: 207 10*3/uL (ref 150–400)
RBC: 4.99 MIL/uL (ref 3.87–5.11)
RDW: 14.7 % (ref 11.5–15.5)
WBC: 5.7 10*3/uL (ref 4.0–10.5)
nRBC: 0 % (ref 0.0–0.2)

## 2021-05-05 LAB — BASIC METABOLIC PANEL
Anion gap: 7 (ref 5–15)
BUN: 15 mg/dL (ref 8–23)
CO2: 27 mmol/L (ref 22–32)
Calcium: 9.6 mg/dL (ref 8.9–10.3)
Chloride: 106 mmol/L (ref 98–111)
Creatinine, Ser: 0.85 mg/dL (ref 0.44–1.00)
GFR, Estimated: >60 mL/min (ref >60)
Glucose, Bld: 89 mg/dL (ref 70–99)
Potassium: 4.2 mmol/L (ref 3.5–5.1)
Sodium: 140 mmol/L (ref 135–145)

## 2021-05-05 LAB — NO BLOOD PRODUCTS

## 2021-05-05 NOTE — Progress Notes (Addendum)
PCP - Dr. Kelton Pillar Cardiologist - Dr. Bennett Scrape  EKG - 04/17/21 Stress Test - 04/07/21 ECHO - 04/07/21  Sleep Study - Yes no OSA diagnosed  DM - Denies  Blood Thinner Instructions:Eliquis instructed to stop 2 days prior  ERAS Protcol - Yes PRE-SURGERY Ensure given   COVID TEST- Not indicated   Anesthesia review: Yes cardiac history  Patient denies shortness of breath, fever, cough and chest pain at PAT appointment   All instructions explained to the patient, with a verbal understanding of the material. Patient agrees to go over the instructions while at home for a better understanding. The opportunity to ask questions was provided.

## 2021-05-05 NOTE — Progress Notes (Signed)
Patient Care Team: Kelton Pillar, MD as PCP - General (Family Medicine) Rockwell Germany, RN as Nurse Navigator Tressie Ellis, Paulette Blanch, RN as Oncology Nurse Navigator Rolm Bookbinder, MD as Consulting Physician (General Surgery) Nicholas Lose, MD as Consulting Physician (Hematology and Oncology) Kyung Rudd, MD as Consulting Physician (Radiation Oncology)  DIAGNOSIS:    ICD-10-CM   1. Malignant neoplasm of lower-outer quadrant of right breast of female, estrogen receptor positive ([place])  C50.511    Z17.0       SUMMARY OF ONCOLOGIC HISTORY: Oncology History  Malignant neoplasm of lower-outer quadrant of right breast of female, estrogen receptor positive ([place])  07/14/2020 Initial Diagnosis   Screening mammogram showed a 1.5cm right breast mass. US showed the 1.4cm mass at the 7 o'clock position, a 2.7cm mass at the 9 o'clock position, and benign right axillary lymph nodes. Biopsy showed ALH at the 9 o'clock position, and at the 7 o'clock position, IDC, grade 3, HER-2 positive (3+), ER+ 20% weak, PR- 0%, Ki67 40%.   09/03/2020 - 11/19/2020 Neo-Adjuvant Chemotherapy   Weekly Taxol (dose reduced) and Herceptin x 12   12/10/2020 -  Chemotherapy   Patient is on Treatment Plan : BREAST Trastuzumab q21d       CHIEF COMPLIANT: Herceptin maintenance (holding because of low ejection fraction)  INTERVAL HISTORY: Claudia Ortiz is a 80 y.o. with above-mentioned history of  right breast cancer who completed neoadjuvant chemotherapy with Taxol Herceptin. She is currently on Herceptin maintenance therapy. She presents to the clinic today for treatment.  We held last time treatment because of low ejection fraction on echocardiogram.  She is here today to discuss her treatment plan.  She is getting ready to undergo surgery next week.  ALLERGIES:  is allergic to iodine, shellfish allergy, atorvastatin, cephalosporins, and penicillins.  MEDICATIONS:  Current Outpatient Medications  Medication Sig  Dispense Refill   apixaban (ELIQUIS) 5 MG TABS tablet Take 1 tablet (5 mg total) by mouth 2 (two) times daily. 56 tablet 0   Apoaequorin (PREVAGEN) 10 MG CAPS Take 10 mg by mouth daily.     b complex vitamins capsule Take 1 capsule by mouth daily.     Bioflavonoid Products (SUPER C-500 COMPLEX PO) Take 1 tablet by mouth daily.     Biotin 5000 MCG CAPS Take 5,000 mcg by mouth daily.     cholecalciferol (VITAMIN D3) 25 MCG (1000 UNIT) tablet Take 1,000 Units by mouth daily.     Evening Primrose Oil 500 MG CAPS Take 500 mg by mouth daily.     Garlic 7416 MG CAPS Take 1,000 mg by mouth daily.     lidocaine-prilocaine (EMLA) cream Apply 1 application topically as needed (for port access).     metoprolol succinate (TOPROL XL) 25 MG 24 hr tablet Take 1 tablet (25 mg total) by mouth daily. 90 tablet 3   Multiple Vitamin (MULTIVITAMIN) tablet Take 1 tablet by mouth daily.     PFIZER-BIONT COVID-19 VAC-TRIS SUSP injection      Vitamin A 7.5 MG (25000 UT) CAPS Take 25,000 Units by mouth daily.     No current facility-administered medications for this visit.    PHYSICAL EXAMINATION: ECOG PERFORMANCE STATUS: 1 - Symptomatic but completely ambulatory  Vitals:   05/06/21 1138  BP: 138/74  Pulse: 69  Resp: 18  Temp: (!) 97.3 F (36.3 C)  SpO2: 98%   Filed Weights   05/06/21 1138  Weight: 188 lb 4.8 oz (85.4 kg)  LABORATORY DATA:  I have reviewed the data as listed CMP Latest Ref Rng & Units 05/06/2021 05/05/2021 04/15/2021  Glucose 70 - 99 mg/dL 105(H) 89 97  BUN 8 - 23 mg/dL $Remove'14 15 18  'lsRrvuu$ Creatinine 0.44 - 1.00 mg/dL 0.85 0.85 0.89  Sodium 135 - 145 mmol/L 141 140 140  Potassium 3.5 - 5.1 mmol/L 4.2 4.2 4.1  Chloride 98 - 111 mmol/L 108 106 105  CO2 22 - 32 mmol/L $RemoveB'25 27 25  'lpEcZREq$ Calcium 8.9 - 10.3 mg/dL 9.7 9.6 9.9  Total Protein 6.5 - 8.1 g/dL 6.8 - 6.9  Total Bilirubin 0.3 - 1.2 mg/dL 0.4 - 0.7  Alkaline Phos 38 - 126 U/L 93 - 96  AST 15 - 41 U/L 22 - 22  ALT 0 - 44 U/L 20 - 19     Lab Results  Component Value Date   WBC 4.9 05/06/2021   HGB 12.2 05/06/2021   HCT 39.2 05/06/2021   MCV 82.7 05/06/2021   PLT 207 05/06/2021   NEUTROABS 2.4 05/06/2021    ASSESSMENT & PLAN:  Malignant neoplasm of lower-outer quadrant of right breast of female, estrogen receptor positive ([place]) 07/14/2020:Screening mammogram showed a 1.5cm right breast mass. US showed the 1.4cm mass at the 7 o'clock position, a 2.7cm mass at the 9 o'clock position, and benign right axillary lymph nodes. Biopsy showed ALH at the 9 o'clock position, and at the 7 o'clock position, IDC, grade 3, HER-2 positive (3+), ER+ 20% weak, PR- 0%, Ki67 40%.   Treatment Plan: 1. Neoadjuvant chemotherapy with Taxol Herceptin followed by Herceptin maintenance for 1 year 2. Followed by breast conserving surgery if possible with sentinel lymph node study (on hold due atrial/tricuspid thrombus) 3. Followed by adjuvant radiation therapy if patient had lumpectomy 4.  Followed by adjuvant antiestrogen therapy ------------------------------------------------------------------------------------------------------------------------------- Current treatment: Herceptin maintenance Echocardiogram May 2022: EF 50 to 55% (until January 2023)   Taking Eliquis due to atrial/tricuspid thrombus and is tolerating this well.  She is due for repeat imaging on 04/07/2021 with echo  To evaluate the thrombus.  She meets with Dr. Chrissie Noa in September to discuss the role of surgery.     Echocardiogram 04/07/2021: EF 35 to 40% I suspect that this is related to Herceptin. We will continue to hold Herceptin and repeat echocardiogram before her next treatment and then determine if she can resume Herceptin.   Surgery has been scheduled for next week.      No orders of the defined types were placed in this encounter.  The patient has a good understanding of the overall plan. she agrees with it. she will call with any problems that may develop  before the next visit here.  Total time spent: 30 mins including face to face time and time spent for planning, charting and coordination of care  Rulon Eisenmenger, MD, MPH 05/06/2021  I, Thana Ates, am acting as scribe for Dr. Nicholas Lose.  I have reviewed the above documentation for accuracy and completeness, and I agree with the above.

## 2021-05-06 ENCOUNTER — Inpatient Hospital Stay: Payer: Medicare Other

## 2021-05-06 ENCOUNTER — Inpatient Hospital Stay (HOSPITAL_BASED_OUTPATIENT_CLINIC_OR_DEPARTMENT_OTHER): Payer: Medicare Other | Admitting: Hematology and Oncology

## 2021-05-06 ENCOUNTER — Other Ambulatory Visit: Payer: Self-pay | Admitting: *Deleted

## 2021-05-06 ENCOUNTER — Inpatient Hospital Stay: Payer: Medicare Other | Attending: Hematology and Oncology

## 2021-05-06 DIAGNOSIS — Z880 Allergy status to penicillin: Secondary | ICD-10-CM | POA: Diagnosis not present

## 2021-05-06 DIAGNOSIS — N6315 Unspecified lump in the right breast, overlapping quadrants: Secondary | ICD-10-CM | POA: Insufficient documentation

## 2021-05-06 DIAGNOSIS — Z888 Allergy status to other drugs, medicaments and biological substances status: Secondary | ICD-10-CM | POA: Diagnosis not present

## 2021-05-06 DIAGNOSIS — Z881 Allergy status to other antibiotic agents status: Secondary | ICD-10-CM | POA: Insufficient documentation

## 2021-05-06 DIAGNOSIS — C50511 Malignant neoplasm of lower-outer quadrant of right female breast: Secondary | ICD-10-CM

## 2021-05-06 DIAGNOSIS — Z170 Estrogen receptor positive status [ER+]: Secondary | ICD-10-CM

## 2021-05-06 DIAGNOSIS — Z5181 Encounter for therapeutic drug level monitoring: Secondary | ICD-10-CM

## 2021-05-06 DIAGNOSIS — Z7901 Long term (current) use of anticoagulants: Secondary | ICD-10-CM | POA: Insufficient documentation

## 2021-05-06 DIAGNOSIS — Z95828 Presence of other vascular implants and grafts: Secondary | ICD-10-CM

## 2021-05-06 DIAGNOSIS — Z79899 Other long term (current) drug therapy: Secondary | ICD-10-CM

## 2021-05-06 LAB — CBC WITH DIFFERENTIAL (CANCER CENTER ONLY)
Abs Immature Granulocytes: 0.01 10*3/uL (ref 0.00–0.07)
Basophils Absolute: 0.1 10*3/uL (ref 0.0–0.1)
Basophils Relative: 1 %
Eosinophils Absolute: 0.1 10*3/uL (ref 0.0–0.5)
Eosinophils Relative: 3 %
HCT: 39.2 % (ref 36.0–46.0)
Hemoglobin: 12.2 g/dL (ref 12.0–15.0)
Immature Granulocytes: 0 %
Lymphocytes Relative: 40 %
Lymphs Abs: 1.9 10*3/uL (ref 0.7–4.0)
MCH: 25.7 pg — ABNORMAL LOW (ref 26.0–34.0)
MCHC: 31.1 g/dL (ref 30.0–36.0)
MCV: 82.7 fL (ref 80.0–100.0)
Monocytes Absolute: 0.4 10*3/uL (ref 0.1–1.0)
Monocytes Relative: 8 %
Neutro Abs: 2.4 10*3/uL (ref 1.7–7.7)
Neutrophils Relative %: 48 %
Platelet Count: 207 10*3/uL (ref 150–400)
RBC: 4.74 MIL/uL (ref 3.87–5.11)
RDW: 14.6 % (ref 11.5–15.5)
WBC Count: 4.9 10*3/uL (ref 4.0–10.5)
nRBC: 0 % (ref 0.0–0.2)

## 2021-05-06 LAB — CMP (CANCER CENTER ONLY)
ALT: 20 U/L (ref 0–44)
AST: 22 U/L (ref 15–41)
Albumin: 3.7 g/dL (ref 3.5–5.0)
Alkaline Phosphatase: 93 U/L (ref 38–126)
Anion gap: 8 (ref 5–15)
BUN: 14 mg/dL (ref 8–23)
CO2: 25 mmol/L (ref 22–32)
Calcium: 9.7 mg/dL (ref 8.9–10.3)
Chloride: 108 mmol/L (ref 98–111)
Creatinine: 0.85 mg/dL (ref 0.44–1.00)
GFR, Estimated: >60 mL/min (ref >60)
Glucose, Bld: 105 mg/dL — ABNORMAL HIGH (ref 70–99)
Potassium: 4.2 mmol/L (ref 3.5–5.1)
Sodium: 141 mmol/L (ref 135–145)
Total Bilirubin: 0.4 mg/dL (ref 0.3–1.2)
Total Protein: 6.8 g/dL (ref 6.5–8.1)

## 2021-05-06 MED ORDER — SODIUM CHLORIDE 0.9% FLUSH
10.0000 mL | Freq: Once | INTRAVENOUS | Status: AC
  Administered 2021-05-06: 10 mL

## 2021-05-06 NOTE — Assessment & Plan Note (Signed)
07/14/2020:Screening mammogram showed a 1.5cm right breast mass. US showed the 1.4cm mass at the 7 o'clock position, a 2.7cm mass at the 9 o'clock position, and benign right axillary lymph nodes. Biopsy showed ALH at the 9 o'clock position, and at the 7 o'clock position, IDC, grade 3, HER-2 positive (3+), ER+ 20% weak, PR- 0%, Ki67 40%.  Treatment Plan: 1. Neoadjuvant chemotherapy withTaxol Herceptinfollowed by Herceptin maintenance for 1 year 2. Followed by breast conserving surgery if possible with sentinel lymph node study(on hold due atrial/tricuspid thrombus) 3. Followed by adjuvant radiation therapy if patient had lumpectomy 4.Followed by adjuvant antiestrogen therapy ------------------------------------------------------------------------------------------------------------------------------- Current treatment: Herceptin maintenance Echocardiogram May 2022: EF 50 to 55% (until January 2023)  Taking Eliquis due to atrial/tricuspid thrombus and is tolerating this well. She is due for repeat imaging on 04/07/2021 with echo To evaluate the thrombus. She meets with Dr. Chrissie Noa in September to discuss the role of surgery.   She has an echocardiogram on 04/07/2021. We will request Dr. Nechama Guard to see her after the echocardiogram to evaluate her readiness for surgery. I would also like to check a mammogram and ultrasound to reassess her disease prior to the surgical decisions with Dr. Cathren Harsh.  She will continue to return every 3 weeks for Herceptin and every 6 weeks with follow-up with me.

## 2021-05-06 NOTE — Anesthesia Preprocedure Evaluation (Addendum)
Anesthesia Evaluation  Patient identified by MRN, date of birth, ID band Patient awake    Reviewed: Allergy & Precautions, NPO status , Patient's Chart, lab work & pertinent test results, reviewed documented beta blocker date and time   Airway Mallampati: II  TM Distance: >3 FB Neck ROM: Full    Dental no notable dental hx. (+) Teeth Intact, Dental Advisory Given   Pulmonary asthma ,    Pulmonary exam normal breath sounds clear to auscultation       Cardiovascular negative cardio ROS Normal cardiovascular exam Rhythm:Regular Rate:Normal  EKG 04/17/2021: Normal sinus rhythm.  Rate 68. Left axis deviation. Inferior infarct , age undetermined.  No significant change.  MR cardiac stress test 04/17/2021: IMPRESSION: 1. Mobile mass attached to the septal leaflet of the tricuspid valve measuring 58mm x 38mm. Mass is isointense to myocardium on T1 weighted imaging, does not suppress with fat suppression, hyperintense to myocardium on T2 weighted imaging, and demonstrates homogenous hyperenhancement on LGE imaging. This is consistent with a papillary fibroelastoma  2. No evidence of ischemia on stress perfusion imaging  3. No myocardial late gadolinium enhancement to suggest myocardial scar  4. Normal LV size, mild hypertrophy, and mild systolic dysfunction (EF 09%)  5. Normal RV size and systolic function (EF 32%)  TTE 04/07/2021: 1. Left ventricular ejection fraction, by estimation, is 35 to 40%. The  left ventricle has moderately decreased function. The left ventricle  demonstrates global hypokinesis. The left ventricular internal cavity size  was mildly dilated. Left ventricular  diastolic parameters are consistent with Grade I diastolic dysfunction  (impaired relaxation).  2. Right ventricular systolic function is normal. The right ventricular  size is normal.  3. Left atrial size was mildly dilated.  4. Right  atrial size was mildly dilated.  5. The mitral valve is normal in structure. Mild mitral valve  regurgitation. No evidence of mitral stenosis.  6. There is a septal leaflet tricuspid valve mobile circular mass (0.9 x  0.8 cm) on the atrial surface.  7. The aortic valve is normal in structure. Aortic valve regurgitation is  not visualized. No aortic stenosis is present.  8. The inferior vena cava is normal in size with greater than 50%  respiratory variability, suggesting right atrial pressure of 3 mmHg.   Comparison(s): No significant change from prior study. Prior images  reviewed side by side. Tricuspid valve mass remains unchanged (TEE  viewed).    Neuro/Psych PSYCHIATRIC DISORDERS Anxiety negative neurological ROS     GI/Hepatic negative GI ROS, Neg liver ROS,   Endo/Other  negative endocrine ROS  Renal/GU negative Renal ROS  negative genitourinary   Musculoskeletal negative musculoskeletal ROS (+)   Abdominal   Peds  Hematology  (+) Blood dyscrasia (on eliquis), , REFUSES BLOOD PRODUCTS (will accept albumin),   Anesthesia Other Findings   Reproductive/Obstetrics                           Anesthesia Physical Anesthesia Plan  ASA: 2  Anesthesia Plan: General and Regional   Post-op Pain Management:  Regional for Post-op pain   Induction: Intravenous  PONV Risk Score and Plan: 3 and Ondansetron, Dexamethasone and Treatment may vary due to age or medical condition  Airway Management Planned: LMA  Additional Equipment:   Intra-op Plan:   Post-operative Plan: Extubation in OR  Informed Consent: I have reviewed the patients History and Physical, chart, labs and discussed the procedure including the risks,  benefits and alternatives for the proposed anesthesia with the patient or authorized representative who has indicated his/her understanding and acceptance.     Dental advisory given  Plan Discussed with: CRNA  Anesthesia Plan  Comments: ()       Anesthesia Quick Evaluation

## 2021-05-06 NOTE — Progress Notes (Signed)
Anesthesia Chart Review:  Patient recently seen by cardiologist Dr. Gardiner Rhyme for evaluation of tricuspid mass and reduced EF in setting of neoadjuvant chemotherapy for neoplasm of right breast.  Telephone encounter 04/20/2021 addresses both of these as well as preop evaluation.  Per note, "Spoke with patient, discussed results of stress MRI.  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  For right-sided papillary fibroelastoma, optimal management is unclear.  Surgical resection can be considered to reduce risk of pulmonary embolism or can monitor and consider resection if increasing in size.  She reports she is not interested in surgery at this time and would like to monitor.  Would be reasonable to continue Eliquis. In regards to her preoperative evaluation prior to breast cancer surgery, her stress test showed no evidence of ischemia.  EF is mildly reduced (43%) but she appears well compensated.  No further cardiac work-up recommended prior to surgery.  Would recommend holding Eliquis 2 days prior to her procedure and restart once okay per her surgeon."   Preop labs reviewed, unremarkable.  EKG 04/17/2021: Normal sinus rhythm.  Rate 68. Left axis deviation. Inferior infarct , age undetermined.  No significant change.  MR cardiac stress test 04/17/2021: IMPRESSION: 1. Mobile mass attached to the septal leaflet of the tricuspid valve measuring 60mm x 29mm. Mass is isointense to myocardium on T1 weighted imaging, does not suppress with fat suppression, hyperintense to myocardium on T2 weighted imaging, and demonstrates homogenous hyperenhancement on LGE imaging. This is consistent with a papillary fibroelastoma   2.  No evidence of ischemia on stress perfusion imaging   3. No myocardial late gadolinium enhancement to suggest myocardial scar   4. Normal LV size, mild hypertrophy, and mild systolic dysfunction (EF 65%)   5.  Normal RV size and systolic function (EF 99%)  TTE  04/07/2021:  1. Left ventricular ejection fraction, by estimation, is 35 to 40%. The  left ventricle has moderately decreased function. The left ventricle  demonstrates global hypokinesis. The left ventricular internal cavity size  was mildly dilated. Left ventricular  diastolic parameters are consistent with Grade I diastolic dysfunction  (impaired relaxation).   2. Right ventricular systolic function is normal. The right ventricular  size is normal.   3. Left atrial size was mildly dilated.   4. Right atrial size was mildly dilated.   5. The mitral valve is normal in structure. Mild mitral valve  regurgitation. No evidence of mitral stenosis.   6. There is a septal leaflet tricuspid valve mobile circular mass (0.9 x  0.8 cm) on the atrial surface.   7. The aortic valve is normal in structure. Aortic valve regurgitation is  not visualized. No aortic stenosis is present.   8. The inferior vena cava is normal in size with greater than 50%  respiratory variability, suggesting right atrial pressure of 3 mmHg.   Comparison(s): No significant change from prior study. Prior images  reviewed side by side. Tricuspid valve mass remains unchanged (TEE  viewed).     Wynonia Musty [facility] Short Stay [facility]/Anesthesiology Phone [phone] 05/06/2021 3:37 PM

## 2021-05-11 DIAGNOSIS — R928 Other abnormal and inconclusive findings on diagnostic imaging of breast: Secondary | ICD-10-CM | POA: Diagnosis not present

## 2021-05-11 DIAGNOSIS — C50811 Malignant neoplasm of overlapping sites of right female breast: Secondary | ICD-10-CM | POA: Diagnosis not present

## 2021-05-11 DIAGNOSIS — D0511 Intraductal carcinoma in situ of right breast: Secondary | ICD-10-CM | POA: Diagnosis not present

## 2021-05-12 ENCOUNTER — Encounter (HOSPITAL_COMMUNITY)
Admission: RE | Disposition: A | Discharge status: Home / Self Care | Payer: Self-pay | Source: Ambulatory Visit | Attending: General Surgery

## 2021-05-12 ENCOUNTER — Ambulatory Visit (HOSPITAL_COMMUNITY)
Admission: RE | Admit: 2021-05-12 | Discharge: 2021-05-12 | Disposition: A | Discharge status: Home / Self Care | Payer: Medicare Other | Source: Ambulatory Visit | Attending: General Surgery | Admitting: General Surgery

## 2021-05-12 ENCOUNTER — Ambulatory Visit (HOSPITAL_COMMUNITY): Payer: Medicare Other | Admitting: Anesthesiology

## 2021-05-12 ENCOUNTER — Other Ambulatory Visit: Payer: Self-pay

## 2021-05-12 ENCOUNTER — Ambulatory Visit (HOSPITAL_COMMUNITY): Payer: Medicare Other | Admitting: Physician Assistant

## 2021-05-12 ENCOUNTER — Encounter (HOSPITAL_COMMUNITY): Payer: Self-pay | Admitting: General Surgery

## 2021-05-12 DIAGNOSIS — Z9221 Personal history of antineoplastic chemotherapy: Secondary | ICD-10-CM | POA: Insufficient documentation

## 2021-05-12 DIAGNOSIS — N6489 Other specified disorders of breast: Secondary | ICD-10-CM | POA: Insufficient documentation

## 2021-05-12 DIAGNOSIS — Z170 Estrogen receptor positive status [ER+]: Secondary | ICD-10-CM | POA: Diagnosis not present

## 2021-05-12 DIAGNOSIS — C50511 Malignant neoplasm of lower-outer quadrant of right female breast: Secondary | ICD-10-CM | POA: Diagnosis not present

## 2021-05-12 DIAGNOSIS — D0501 Lobular carcinoma in situ of right breast: Secondary | ICD-10-CM | POA: Insufficient documentation

## 2021-05-12 DIAGNOSIS — N6021 Fibroadenosis of right breast: Secondary | ICD-10-CM | POA: Insufficient documentation

## 2021-05-12 DIAGNOSIS — G8918 Other acute postprocedural pain: Secondary | ICD-10-CM | POA: Diagnosis not present

## 2021-05-12 DIAGNOSIS — E7800 Pure hypercholesterolemia, unspecified: Secondary | ICD-10-CM | POA: Diagnosis not present

## 2021-05-12 DIAGNOSIS — D241 Benign neoplasm of right breast: Secondary | ICD-10-CM | POA: Diagnosis not present

## 2021-05-12 DIAGNOSIS — Z7901 Long term (current) use of anticoagulants: Secondary | ICD-10-CM | POA: Diagnosis not present

## 2021-05-12 DIAGNOSIS — R9431 Abnormal electrocardiogram [ECG] [EKG]: Secondary | ICD-10-CM | POA: Diagnosis not present

## 2021-05-12 DIAGNOSIS — N6011 Diffuse cystic mastopathy of right breast: Secondary | ICD-10-CM | POA: Diagnosis not present

## 2021-05-12 DIAGNOSIS — C50911 Malignant neoplasm of unspecified site of right female breast: Secondary | ICD-10-CM | POA: Diagnosis not present

## 2021-05-12 HISTORY — PX: BREAST LUMPECTOMY: SHX2

## 2021-05-12 HISTORY — PX: AXILLARY SENTINEL NODE BIOPSY: SHX5738

## 2021-05-12 HISTORY — PX: BREAST LUMPECTOMY WITH RADIOACTIVE SEED AND SENTINEL LYMPH NODE BIOPSY: SHX6550

## 2021-05-12 LAB — NO BLOOD PRODUCTS

## 2021-05-12 SURGERY — BREAST LUMPECTOMY WITH RADIOACTIVE SEED AND SENTINEL LYMPH NODE BIOPSY
Anesthesia: Regional | Site: Breast | Laterality: Right

## 2021-05-12 MED ORDER — TRAMADOL HCL 50 MG PO TABS: 100.0000 mg | ORAL_TABLET | Freq: Four times a day (QID) | ORAL | 0 refills | Status: DC | PRN

## 2021-05-12 MED ORDER — METHYLENE BLUE 0.5 % INJ SOLN
INTRAVENOUS | Status: AC
  Filled 2021-05-12: qty 10

## 2021-05-12 MED ORDER — PHENYLEPHRINE HCL-NACL 20-0.9 MG/250ML-% IV SOLN
INTRAVENOUS | Status: DC | PRN
  Administered 2021-05-12: 25 ug/min via INTRAVENOUS

## 2021-05-12 MED ORDER — SODIUM CHLORIDE (PF) 0.9 % IJ SOLN
INTRAMUSCULAR | Status: AC
  Filled 2021-05-12: qty 10

## 2021-05-12 MED ORDER — DEXAMETHASONE SODIUM PHOSPHATE 10 MG/ML IJ SOLN
INTRAMUSCULAR | Status: AC
  Filled 2021-05-12: qty 1

## 2021-05-12 MED ORDER — ONDANSETRON HCL 4 MG/2ML IJ SOLN
INTRAMUSCULAR | Status: AC
  Filled 2021-05-12: qty 2

## 2021-05-12 MED ORDER — CHLORHEXIDINE GLUCONATE 0.12 % MT SOLN
15.0000 mL | Freq: Once | OROMUCOSAL | Status: AC
  Administered 2021-05-12: 15 mL via OROMUCOSAL
  Filled 2021-05-12: qty 15

## 2021-05-12 MED ORDER — MIDAZOLAM HCL 2 MG/2ML IJ SOLN
INTRAMUSCULAR | Status: AC
  Filled 2021-05-12: qty 2

## 2021-05-12 MED ORDER — ONDANSETRON HCL 4 MG/2ML IJ SOLN
INTRAMUSCULAR | Status: DC | PRN
  Administered 2021-05-12: 4 mg via INTRAVENOUS

## 2021-05-12 MED ORDER — FENTANYL CITRATE (PF) 250 MCG/5ML IJ SOLN
INTRAMUSCULAR | Status: AC
  Filled 2021-05-12: qty 5

## 2021-05-12 MED ORDER — CIPROFLOXACIN IN D5W 400 MG/200ML IV SOLN
400.0000 mg | INTRAVENOUS | Status: AC
  Administered 2021-05-12: 400 mg via INTRAVENOUS
  Filled 2021-05-12: qty 200

## 2021-05-12 MED ORDER — MAGTRACE LYMPHATIC TRACER
INTRAMUSCULAR | Status: DC | PRN
  Administered 2021-05-12: 2 mL via INTRAMUSCULAR

## 2021-05-12 MED ORDER — BUPIVACAINE-EPINEPHRINE 0.25% -1:200000 IJ SOLN
INTRAMUSCULAR | Status: DC | PRN
  Administered 2021-05-12: 6 mL

## 2021-05-12 MED ORDER — GLYCOPYRROLATE PF 0.2 MG/ML IJ SOSY
PREFILLED_SYRINGE | INTRAMUSCULAR | Status: AC
  Filled 2021-05-12: qty 1

## 2021-05-12 MED ORDER — CHLORHEXIDINE GLUCONATE CLOTH 2 % EX PADS: 6.0000 | MEDICATED_PAD | Freq: Once | CUTANEOUS | Status: DC

## 2021-05-12 MED ORDER — PHENYLEPHRINE 40 MCG/ML (10ML) SYRINGE FOR IV PUSH (FOR BLOOD PRESSURE SUPPORT)
PREFILLED_SYRINGE | INTRAVENOUS | Status: AC
  Filled 2021-05-12: qty 10

## 2021-05-12 MED ORDER — PROPOFOL 10 MG/ML IV BOLUS
INTRAVENOUS | Status: DC | PRN
  Administered 2021-05-12: 110 mg via INTRAVENOUS

## 2021-05-12 MED ORDER — FENTANYL CITRATE (PF) 100 MCG/2ML IJ SOLN
INTRAMUSCULAR | Status: AC
  Filled 2021-05-12: qty 2

## 2021-05-12 MED ORDER — SODIUM CHLORIDE (PF) 0.9 % IJ SOLN
INTRAVENOUS | Status: DC | PRN
  Administered 2021-05-12: 5 mL

## 2021-05-12 MED ORDER — LIDOCAINE 2% (20 MG/ML) 5 ML SYRINGE
INTRAMUSCULAR | Status: DC | PRN
  Administered 2021-05-12: 20 mg via INTRAVENOUS

## 2021-05-12 MED ORDER — DEXAMETHASONE SODIUM PHOSPHATE 10 MG/ML IJ SOLN
INTRAMUSCULAR | Status: DC | PRN
  Administered 2021-05-12: 5 mg

## 2021-05-12 MED ORDER — FENTANYL CITRATE (PF) 100 MCG/2ML IJ SOLN
25.0000 ug | INTRAMUSCULAR | Status: DC | PRN
  Administered 2021-05-12 (×4): 25 ug via INTRAVENOUS

## 2021-05-12 MED ORDER — PROPOFOL 10 MG/ML IV BOLUS
INTRAVENOUS | Status: AC
  Filled 2021-05-12: qty 20

## 2021-05-12 MED ORDER — BUPIVACAINE-EPINEPHRINE (PF) 0.25% -1:200000 IJ SOLN
INTRAMUSCULAR | Status: AC
  Filled 2021-05-12: qty 30

## 2021-05-12 MED ORDER — DEXAMETHASONE SODIUM PHOSPHATE 10 MG/ML IJ SOLN
INTRAMUSCULAR | Status: DC | PRN
  Administered 2021-05-12: 4 mg via INTRAVENOUS

## 2021-05-12 MED ORDER — LIDOCAINE 2% (20 MG/ML) 5 ML SYRINGE
INTRAMUSCULAR | Status: AC
  Filled 2021-05-12: qty 5

## 2021-05-12 MED ORDER — ACETAMINOPHEN 500 MG PO TABS
1000.0000 mg | ORAL_TABLET | Freq: Once | ORAL | Status: AC
  Administered 2021-05-12: 1000 mg via ORAL
  Filled 2021-05-12: qty 2

## 2021-05-12 MED ORDER — ENSURE PRE-SURGERY PO LIQD: 296.0000 mL | Freq: Once | ORAL | Status: DC

## 2021-05-12 MED ORDER — ROPIVACAINE HCL 5 MG/ML IJ SOLN
INTRAMUSCULAR | Status: DC | PRN
  Administered 2021-05-12: 30 mL via PERINEURAL

## 2021-05-12 MED ORDER — PHENYLEPHRINE 40 MCG/ML (10ML) SYRINGE FOR IV PUSH (FOR BLOOD PRESSURE SUPPORT)
PREFILLED_SYRINGE | INTRAVENOUS | Status: DC | PRN
  Administered 2021-05-12: 80 ug via INTRAVENOUS
  Administered 2021-05-12: 200 ug via INTRAVENOUS

## 2021-05-12 MED ORDER — 0.9 % SODIUM CHLORIDE (POUR BTL) OPTIME
TOPICAL | Status: DC | PRN
  Administered 2021-05-12: 1000 mL

## 2021-05-12 MED ORDER — ORAL CARE MOUTH RINSE: 15.0000 mL | Freq: Once | OROMUCOSAL | Status: AC

## 2021-05-12 MED ORDER — FENTANYL CITRATE (PF) 100 MCG/2ML IJ SOLN
INTRAMUSCULAR | Status: AC
  Administered 2021-05-12: 100 ug via INTRAVENOUS
  Filled 2021-05-12: qty 2

## 2021-05-12 MED ORDER — LACTATED RINGERS IV SOLN: INTRAVENOUS | Status: DC

## 2021-05-12 MED ORDER — FENTANYL CITRATE (PF) 100 MCG/2ML IJ SOLN: 100.0000 ug | Freq: Once | INTRAMUSCULAR | Status: AC

## 2021-05-12 SURGICAL SUPPLY — 44 items
APPLIER CLIP 9.375 MED OPEN (MISCELLANEOUS) ×2
BAG COUNTER SPONGE SURGICOUNT (BAG) ×2 IMPLANT
BAG SPNG CNTER NS LX DISP (BAG) ×1
BINDER BREAST LRG (GAUZE/BANDAGES/DRESSINGS) IMPLANT
BINDER BREAST XLRG (GAUZE/BANDAGES/DRESSINGS) ×2 IMPLANT
CANISTER SUCT 3000ML PPV (MISCELLANEOUS) ×2 IMPLANT
CHLORAPREP W/TINT 26 (MISCELLANEOUS) ×2 IMPLANT
CLIP APPLIE 9.375 MED OPEN (MISCELLANEOUS) ×1 IMPLANT
CLIP VESOCCLUDE MED 6/CT (CLIP) ×2 IMPLANT
COVER PROBE W GEL 5X96 (DRAPES) ×4 IMPLANT
COVER SURGICAL LIGHT HANDLE (MISCELLANEOUS) ×2 IMPLANT
DERMABOND ADVANCED (GAUZE/BANDAGES/DRESSINGS) ×1
DERMABOND ADVANCED .7 DNX12 (GAUZE/BANDAGES/DRESSINGS) ×1 IMPLANT
DEVICE DUBIN SPECIMEN MAMMOGRA (MISCELLANEOUS) ×4 IMPLANT
DRAPE CHEST BREAST 15X10 FENES (DRAPES) ×2 IMPLANT
ELECT COATED BLADE 2.86 ST (ELECTRODE) ×2 IMPLANT
ELECT REM PT RETURN 9FT ADLT (ELECTROSURGICAL) ×2
ELECTRODE REM PT RTRN 9FT ADLT (ELECTROSURGICAL) ×1 IMPLANT
GLOVE SURG ENC MOIS LTX SZ7 (GLOVE) ×2 IMPLANT
GLOVE SURG UNDER POLY LF SZ7.5 (GLOVE) ×2 IMPLANT
GOWN STRL REUS W/ TWL LRG LVL3 (GOWN DISPOSABLE) ×2 IMPLANT
GOWN STRL REUS W/TWL LRG LVL3 (GOWN DISPOSABLE) ×4
ILLUMINATOR WAVEGUIDE N/F (MISCELLANEOUS) ×2 IMPLANT
KIT BASIN OR (CUSTOM PROCEDURE TRAY) ×2 IMPLANT
KIT MARKER MARGIN INK (KITS) ×2 IMPLANT
LIGHT WAVEGUIDE WIDE FLAT (MISCELLANEOUS) ×2 IMPLANT
NEEDLE 18GX1X1/2 (RX/OR ONLY) (NEEDLE) IMPLANT
NEEDLE FILTER BLUNT 18X 1/2SAF (NEEDLE)
NEEDLE FILTER BLUNT 18X1 1/2 (NEEDLE) IMPLANT
NEEDLE HYPO 25GX1X1/2 BEV (NEEDLE) ×2 IMPLANT
NS IRRIG 1000ML POUR BTL (IV SOLUTION) ×2 IMPLANT
PACK GENERAL/GYN (CUSTOM PROCEDURE TRAY) ×2 IMPLANT
STRIP CLOSURE SKIN 1/2X4 (GAUZE/BANDAGES/DRESSINGS) ×2 IMPLANT
SUT MNCRL AB 4-0 PS2 18 (SUTURE) ×4 IMPLANT
SUT MON AB 5-0 PS2 18 (SUTURE) ×2 IMPLANT
SUT SILK 2 0 SH (SUTURE) ×2 IMPLANT
SUT VIC AB 2-0 SH 27 (SUTURE) ×6
SUT VIC AB 2-0 SH 27XBRD (SUTURE) ×3 IMPLANT
SUT VIC AB 3-0 SH 27 (SUTURE) ×4
SUT VIC AB 3-0 SH 27X BRD (SUTURE) ×2 IMPLANT
SYR CONTROL 10ML LL (SYRINGE) ×2 IMPLANT
TOWEL GREEN STERILE (TOWEL DISPOSABLE) ×2 IMPLANT
TOWEL GREEN STERILE FF (TOWEL DISPOSABLE) ×2 IMPLANT
TRACER MAGTRACE VIAL (MISCELLANEOUS) ×2 IMPLANT

## 2021-05-12 NOTE — Transfer of Care (Signed)
Immediate Anesthesia Transfer of Care Note  Patient: Claudia Ortiz  Procedure(s) Performed: RIGHT BREAST LUMPECTOMY WITH RADIOACTIVE SEED X3 (Right: Breast) AXILLARY SENTINEL NODE BIOPSY WITH MAGTRACE (Right: Breast)  Patient Location: PACU  Anesthesia Type:GA combined with regional for post-op pain  Level of Consciousness: drowsy and patient cooperative  Airway & Oxygen Therapy: Patient Spontanous Breathing and Patient connected to nasal cannula oxygen  Post-op Assessment: Report given to RN and Post -op Vital signs reviewed and stable  Post vital signs: Reviewed and stable  Last Vitals:  Vitals Value Taken Time  BP 144/70 05/12/21 1021  Temp 36.4 C 05/12/21 1020  Pulse 64 05/12/21 1023  Resp 13 05/12/21 1023  SpO2 99 % 05/12/21 1023  Vitals shown include unvalidated device data.  Last Pain:  Vitals:   05/12/21 0801  TempSrc:   PainSc: 0-No pain         Complications: No notable events documented.

## 2021-05-12 NOTE — Anesthesia Postprocedure Evaluation (Signed)
Anesthesia Post Note  Patient: [name]  Procedure(s) Performed: RIGHT BREAST LUMPECTOMY WITH RADIOACTIVE SEED X3 (Right: Breast) AXILLARY SENTINEL NODE BIOPSY WITH MAGTRACE (Right: Breast)     Patient location during evaluation: PACU Anesthesia Type: Regional and General Level of consciousness: awake and alert Pain management: pain level controlled Vital Signs Assessment: post-procedure vital signs reviewed and stable Respiratory status: spontaneous breathing, nonlabored ventilation, respiratory function stable and patient connected to nasal cannula oxygen Cardiovascular status: blood pressure returned to baseline and stable Postop Assessment: no apparent nausea or vomiting Anesthetic complications: no   No notable events documented.  Last Vitals:  Vitals:   05/12/21 1105 05/12/21 1110  BP: 130/65 123/63  Pulse: 64 63  Resp: 19 10  Temp:  36.4 C  SpO2: 100% 95%    Last Pain:  Vitals:   05/12/21 1050  TempSrc:   PainSc: 7                  Danyiel Crespin L Francies Inch

## 2021-05-12 NOTE — Interval H&P Note (Signed)
History and Physical Interval Note:  05/12/2021 8:41 AM  Claudia Ortiz  has presented today for surgery, with the diagnosis of RIGHT BREAST CANCER.  The various methods of treatment have been discussed with the patient and family. After consideration of risks, benefits and other options for treatment, the patient has consented to  Procedure(s): RIGHT BREAST LUMPECTOMY WITH RADIOACTIVE SEED X2 AND AXILLARY SENTINEL LYMPH NODE BIOPSY (Right) as a surgical intervention.  The patient's history has been reviewed, patient examined, no change in status, stable for surgery.  I have reviewed the patient's chart and labs.  Questions were answered to the patient's satisfaction.     Rolm Bookbinder

## 2021-05-12 NOTE — H&P (Signed)
80 y.o. female who is seen today for breast cancer follow up. She initially had a screening mm that showed a right lower quadrant mass with calcs. There was a 1.5 cm mass in LOQ and on [place] had a 1.6 cm mass at 9 oclock. While doing [place] there was another 2.7 cm irregular mass at 9 oclock. Axillary was negative. Biopsies of both were done. The biopsy at 9 is csl with [facility]. The biopsy of the mass is grade III IDC that is er pos, pr neg, her 2 pos, Ki 40%. On MRI she had the known 1.8 cm cancer with linear nme extending 2.5 cm out and then an 8 mm indeterminate mass in the anterior retroareolar area. The left was negative, nodes all negative. Biopsy of the right breast ra and lower anterior area are both hg dcis. She then did primary chemotherapy and tried to do another mri and was unable to be read due to motion. She says she has had right breast pain since then. I had her scheduled for bracketed lumpectomy and sn biopsy. On preop ekg was noted to have abnormality and has been undergoing cardiology workup since then and has not been cleared for surgery. She remains on her 2 maintenance therapy. She is due to see cardiology next week. Remains on eliquis and appears to possibly have a tricuspid abnormality. She did have a repeat mm/us today. Density is c. In right breast the previously seen 1.6 cm mass at 6-7 is now 1x1 cm. About 2.4 and 3 cm anterior to the ribbon clip there are the two other clips of biopsied DCIS. All three clips line up in a ray and span 3.5 cm a-p. There is also stable appearance of the prior csl measuring 1.2 cm. She is here to discuss surgery today.  Review of Systems: A complete review of systems was obtained from the patient. I have reviewed this information and discussed as appropriate with the patient. See HPI as well for other ROS.  Review of Systems  All other systems reviewed and are negative.   Medical History: History reviewed. No pertinent past medical history.  Patient  Active Problem List  Diagnosis   Abnormal ECG   Malignant neoplasm of lower-outer quadrant of right breast of female, estrogen receptor positive (CMS-HCC)   Tricuspid valve mass   Past Surgical History:  Procedure Laterality Date   TONSILLECTOMY    Allergies  Allergen Reactions   Iodine Anaphylaxis   Cephalosporins Other (See Comments)  unkn    Penicillins Other (See Comments)  Unkn   Current Outpatient Medications on File Prior to Visit  Medication Sig Dispense Refill   anastrozole (ARIMIDEX) 1 mg tablet Take 1 mg by mouth once daily   ELIQUIS 5 mg tablet Take 5 mg by mouth 2 (two) times daily   No current facility-administered medications on file prior to visit.   History reviewed. No pertinent family history.   Social History   Tobacco Use  Smoking Status Never Smoker  Smokeless Tobacco Never Used    Social History   Socioeconomic History   Marital status: Married  Tobacco Use   Smoking status: Never Smoker   Smokeless tobacco: Never Used   Objective:   Physical Exam Constitutional:  Appearance: Normal appearance.  Cardiovascular:  Rate and Rhythm: Normal rate.  Pulmonary:  Effort: Pulmonary effort is normal.  Chest:  Breasts:  Right: No mass or nipple discharge.  Left: No mass or nipple discharge.  Lymphadenopathy:  Upper Body:  Right upper body: No supraclavicular or axillary adenopathy.  Left upper body: No supraclavicular or axillary adenopathy.  Neurological:  Mental Status: She is alert.    Assessment and Plan:   Malignant neoplasm of lower-outer quadrant of right breast of female, estrogen receptor positive (CMS-HCC)  Right breast bracketed lumpectomy, right axillary sn biopsy  She will not receive blood products. Will need to wait for cardiology clearance and need to be off eliquis for 48 hours prior to surgery. We discussed a sentinel lymph node biopsy again as she does not appear to having lymph node involvement still. We  discussed the performance of that with injection of Magtrace. We discussed that there is a chance of having a positive node with a sentinel lymph node biopsy and we will await the permanent pathology to make any other first further decisions in terms of her treatment. We discussed up to a 5% risk lifetime of chronic shoulder pain as well as lymphedema associated with a sentinel lymph node biopsy.  We discussed the options for treatment of the breast cancer which included lumpectomy versus a mastectomy. We discussed the performance of the lumpectomy with radioactive seed placement. We discussed up to a 10% chance of a positive margin requiring reexcision in the operating room. We also discussed that she will need radiation therapy if she undergoes lumpectomy. We discussed mastectomy and the postoperative care for that as well. Mastectomy can be followed by reconstruction. The decision for lumpectomy vs mastectomy has no impact on decision for chemotherapy. Most mastectomy patients will not need radiation therapy. We discussed that there is no difference in her survival whether she undergoes lumpectomy with radiation therapy or antiestrogen therapy versus a mastectomy. There is also no real difference between her recurrence in the breast. We discussed the risks of operation including bleeding, infection, possible reoperation. She understands her further therapy will be based on what her stages at the time of her operation.

## 2021-05-12 NOTE — Op Note (Signed)
Preoperative diagnosis: Right breast cancer clinical stage II status post primary chemotherapy Postoperative diagnosis: Same as above Procedure: 1.  Right breast radioactive seed bracketed lumpectomy 2.  Right deep axillary sentinel lymph node biopsy 3.  Injection of blue dye and mag trace for sentinel lymph node identification Surgeon: Dr. Serita Grammes Anesthesia: General with a pectoral block Estimated blood loss: Minimal Drains: None Complications: None Specimens: 1.  Right breast lumpectomy containing 3 clips and 2 seeds marked with paint 2.  Lateral right breast tissue containing additional seed and clip marked with paint 3.  Right axillary sentinel lymph nodes with highest count of 6455 Sponge and count was correct completion Disposition to recovery stable condition  Indications: This is then 80 year old female who underwent screening mammography.  This eventually showed a 1.5 cm mass in the lower outer quadrant as well as an additional 1.6 cm mass at 9:00 and another 2.7 cm mass at 9:00 as well.  The axilla was negative.  1 of these was a complex sclerosing lesion with atypical lobular hyperplasia in the lateral position.  Biopsy of the cancer shows a grade 3 invasive ductal carcinoma that is HER2 positive.  She is undergone primary chemotherapy.  She is unable to complete additional MR imaging but these areas do look like they have gotten smaller.  I had her scheduled previously and she had an EKG abnormality and is taken her some time for her to get cleared by cardiology.  We discussed a radioactive seed bracketed lumpectomy as she decided to preserve her breast as well as a sentinel lymph node biopsy.  Procedure: After informed consent was obtained the patient first was given antibiotics.  SCDs were in place.  I first prepped the area on the lateral portion of the areola.  I infiltrated some lidocaine.  I then injected 2 cc of mag trace in the subareolar position.  She then underwent  a pectoral block.  She was then taken to the operating room placed under general anesthesia without complication.  She was prepped and draped in the standard sterile surgical fashion.  Surgical timeout was then performed.  I did inject 4 cc of a methylene blue saline mixture prior to beginning as well.  She had received chemotherapy so I use dual tracer.  I first did the lumpectomy.  I infiltrated some lidocaine in the periareolar position.  I then made an inferior periareolar incision in order to hide the scar later.  I used the neoprobe to remove the bracketed cancer.  I remove the anterior and posterior seeds and the intervening tissue.  I did this with an attempt to get a clear margin.  I then did a mammogram which confirmed removal of 3 clips and 2 seeds.  This was marked with paint.  The lateral seed which was the complex sclerosing lesion with atypical lobular hyperplasia was then removed as well.  This was marked with pain also.  Mammogram confirmed removal of the seed and the clip.  I then obtained hemostasis.  I closed the breast tissue with 2-0 Vicryl.  The skin was closed with 3-0 Vicryl 5-0 Monocryl.  Glue and Steri-Strips were eventually applied.  I then was able to identify a sentinel node with the center mag probe.  I made a incision below the axillary hairline and dissected through the axillary fascia.  There were 2 blue nodes and at least 3 that appeared brown stained as well as had activity in them.  I remove these and passed them  off the table.  There was no additional blue dye, palpable nodes, or activity present.  I then obtained hemostasis.  I closed this with 2-0 Vicryl, 3-0 Vicryl, 4 Monocryl.  Glue Steri-Strips were applied.  She tolerated this well was extubated transferred to recovery stable.

## 2021-05-12 NOTE — Anesthesia Procedure Notes (Signed)
Procedure Name: LMA Insertion Date/Time: 05/12/2021 9:11 AM Performed by: Renato Shin, CRNA Pre-anesthesia Checklist: Patient identified, Emergency Drugs available, Suction available and Patient being monitored Patient Re-evaluated:Patient Re-evaluated prior to induction Oxygen Delivery Method: Circle system utilized Preoxygenation: Pre-oxygenation with 100% oxygen Induction Type: IV induction Ventilation: Mask ventilation without difficulty LMA: LMA inserted LMA Size: 4.0 Number of attempts: 1 Placement Confirmation: positive ETCO2 and breath sounds checked- equal and bilateral Tube secured with: Tape Dental Injury: Teeth and Oropharynx as per pre-operative assessment

## 2021-05-12 NOTE — Anesthesia Procedure Notes (Signed)
Anesthesia Regional Block: Pectoralis block   Pre-Anesthetic Checklist: , timeout performed,  Correct Patient, Correct Site, Correct Laterality,  Correct Procedure, Correct Position, site marked,  Risks and benefits discussed,  Surgical consent,  Pre-op evaluation,  At surgeon's request and post-op pain management  Laterality: Right  Prep: Maximum Sterile Barrier Precautions used, chloraprep       Needles:  Injection technique: Single-shot  Needle Type: Echogenic Stimulator Needle     Needle Length: 9cm  Needle Gauge: 22     Additional Needles:   Procedures:,,,, ultrasound used (permanent image in chart),,    Narrative:  Start time: 05/12/2021 8:55 AM End time: 05/12/2021 9:00 AM Injection made incrementally with aspirations every 5 mL.  Performed by: Personally  Anesthesiologist: Freddrick March, MD  Additional Notes: Monitors applied. No increased pain on injection. No increased resistance to injection. Injection made in 5cc increments. Good needle visualization. Patient tolerated procedure well.

## 2021-05-13 ENCOUNTER — Encounter (HOSPITAL_COMMUNITY): Payer: Self-pay | Admitting: General Surgery

## 2021-05-15 LAB — SURGICAL PATHOLOGY

## 2021-05-19 ENCOUNTER — Encounter: Payer: Self-pay | Admitting: *Deleted

## 2021-05-19 ENCOUNTER — Telehealth: Payer: Self-pay | Admitting: Radiation Oncology

## 2021-05-19 DIAGNOSIS — Z170 Estrogen receptor positive status [ER+]: Secondary | ICD-10-CM

## 2021-05-20 ENCOUNTER — Telehealth: Payer: Self-pay | Admitting: Radiation Oncology

## 2021-05-25 ENCOUNTER — Ambulatory Visit (HOSPITAL_COMMUNITY)
Admission: RE | Admit: 2021-05-25 | Discharge: 2021-05-25 | Disposition: A | Discharge status: Home / Self Care | Payer: Medicare Other | Source: Ambulatory Visit | Attending: Hematology and Oncology | Admitting: Hematology and Oncology

## 2021-05-25 ENCOUNTER — Encounter: Payer: Self-pay | Admitting: *Deleted

## 2021-05-25 DIAGNOSIS — C50919 Malignant neoplasm of unspecified site of unspecified female breast: Secondary | ICD-10-CM | POA: Diagnosis not present

## 2021-05-25 DIAGNOSIS — E785 Hyperlipidemia, unspecified: Secondary | ICD-10-CM | POA: Insufficient documentation

## 2021-05-25 DIAGNOSIS — I424 Endocardial fibroelastosis: Secondary | ICD-10-CM | POA: Insufficient documentation

## 2021-05-25 DIAGNOSIS — I700 Atherosclerosis of aorta: Secondary | ICD-10-CM | POA: Diagnosis not present

## 2021-05-25 DIAGNOSIS — Z79899 Other long term (current) drug therapy: Secondary | ICD-10-CM

## 2021-05-25 DIAGNOSIS — Z5181 Encounter for therapeutic drug level monitoring: Secondary | ICD-10-CM | POA: Diagnosis not present

## 2021-05-25 DIAGNOSIS — Z0189 Encounter for other specified special examinations: Secondary | ICD-10-CM | POA: Diagnosis not present

## 2021-05-25 LAB — ECHOCARDIOGRAM COMPLETE
Area-P 1/2: 3.08 cm2
Calc EF: 41.1 %
S' Lateral: 3.7 cm
Single Plane A2C EF: 35.5 %
Single Plane A4C EF: 47.9 %

## 2021-05-25 NOTE — Progress Notes (Signed)
Echocardiogram 2D Echocardiogram has been performed.  Darlina Sicilian M 05/25/2021, 11:47 AM

## 2021-05-26 NOTE — Progress Notes (Signed)
Patient Care Team: Kelton Pillar, MD as PCP - General (Family Medicine) Rockwell Germany, RN as Nurse Navigator Tressie Ellis, Paulette Blanch, RN as Oncology Nurse Navigator Rolm Bookbinder, MD as Consulting Physician (General Surgery) Nicholas Lose, MD as Consulting Physician (Hematology and Oncology) Kyung Rudd, MD as Consulting Physician (Radiation Oncology)  DIAGNOSIS:    ICD-10-CM   1. Malignant neoplasm of lower-outer quadrant of right breast of female, estrogen receptor positive ([place])  C50.511    Z17.0       SUMMARY OF ONCOLOGIC HISTORY: Oncology History  Malignant neoplasm of lower-outer quadrant of right breast of female, estrogen receptor positive ([place])  07/14/2020 Initial Diagnosis   Screening mammogram showed a 1.5cm right breast mass. US showed the 1.4cm mass at the 7 o'clock position, a 2.7cm mass at the 9 o'clock position, and benign right axillary lymph nodes. Biopsy showed ALH at the 9 o'clock position, and at the 7 o'clock position, IDC, grade 3, HER-2 positive (3+), ER+ 20% weak, PR- 0%, Ki67 40%.   09/03/2020 - 11/19/2020 Neo-Adjuvant Chemotherapy   Weekly Taxol (dose reduced) and Herceptin x 12   12/10/2020 -  Chemotherapy   Patient is on Treatment Plan : BREAST Trastuzumab q21d     05/12/2021 Surgery   Right lumpectomy: LCIS involving CSL, intraductal papilloma negative for IDC or DCIS Right lateral margin lumpectomy: LCIS, ADH Lymph nodes: 3 negative      CHIEF COMPLIANT: Herceptin maintenance (holding because of low ejection fraction)  INTERVAL HISTORY: Claudia Ortiz is a 80 y.o. with above-mentioned history of right breast cancer who completed neoadjuvant chemotherapy with Taxol Herceptin. She is currently on Herceptin maintenance therapy. She presents to the clinic today for treatment.  She is recovering very well from recent surgery.  She is very happy to hear that she had a complete response.  She has an appointment with radiation oncology coming  up.  ALLERGIES:  is allergic to iodine, shellfish allergy, atorvastatin, cephalosporins, and penicillins.  MEDICATIONS:  Current Outpatient Medications  Medication Sig Dispense Refill   Apoaequorin (PREVAGEN) 10 MG CAPS Take 10 mg by mouth daily.     aspirin EC 81 MG tablet Take 1 tablet (81 mg total) by mouth daily. Swallow whole. 90 tablet 3   b complex vitamins capsule Take 1 capsule by mouth daily.     Bioflavonoid Products (SUPER C-500 COMPLEX PO) Take 1 tablet by mouth daily.     Biotin 5000 MCG CAPS Take 5,000 mcg by mouth daily.     cholecalciferol (VITAMIN D3) 25 MCG (1000 UNIT) tablet Take 1,000 Units by mouth daily.     Evening Primrose Oil 500 MG CAPS Take 500 mg by mouth daily.     Garlic 8850 MG CAPS Take 1,000 mg by mouth daily.     lidocaine-prilocaine (EMLA) cream Apply 1 application topically as needed (for port access).     losartan (COZAAR) 25 MG tablet Take 1 tablet (25 mg total) by mouth daily. 90 tablet 3   metoprolol succinate (TOPROL XL) 25 MG 24 hr tablet Take 1 tablet (25 mg total) by mouth daily. 90 tablet 3   Multiple Vitamin (MULTIVITAMIN) tablet Take 1 tablet by mouth daily.     PFIZER-BIONT COVID-19 VAC-TRIS SUSP injection      traMADol (ULTRAM) 50 MG tablet Take 2 tablets (100 mg total) by mouth every 6 (six) hours as needed. 10 tablet 0   Vitamin A 7.5 MG (25000 UT) CAPS Take 25,000 Units by mouth daily.  No current facility-administered medications for this visit.    PHYSICAL EXAMINATION: ECOG PERFORMANCE STATUS: 1 - Symptomatic but completely ambulatory  Vitals:   05/27/21 1439  BP: (!) 113/58  Pulse: 82  Resp: 18  Temp: (!) 97.5 F (36.4 C)  SpO2: 98%   Filed Weights   05/27/21 1439  Weight: 187 lb 9.6 oz (85.1 kg)    LABORATORY DATA:  I have reviewed the data as listed CMP Latest Ref Rng & Units 05/27/2021 05/06/2021 05/05/2021  Glucose 70 - 99 mg/dL 105(H) 105(H) 89  BUN 8 - 23 mg/dL $Remove'15 14 15  'jfoxiqA$ Creatinine 0.44 - 1.00 mg/dL 0.94  0.85 0.85  Sodium 135 - 145 mmol/L 141 141 140  Potassium 3.5 - 5.1 mmol/L 4.0 4.2 4.2  Chloride 98 - 111 mmol/L 107 108 106  CO2 22 - 32 mmol/L $RemoveB'25 25 27  'ylKezkkS$ Calcium 8.9 - 10.3 mg/dL 9.3 9.7 9.6  Total Protein 6.5 - 8.1 g/dL 6.7 6.8 -  Total Bilirubin 0.3 - 1.2 mg/dL 0.4 0.4 -  Alkaline Phos 38 - 126 U/L 106 93 -  AST 15 - 41 U/L 23 22 -  ALT 0 - 44 U/L 18 20 -    Lab Results  Component Value Date   WBC 5.9 05/27/2021   HGB 12.1 05/27/2021   HCT 37.3 05/27/2021   MCV 81.1 05/27/2021   PLT 208 05/27/2021   NEUTROABS 3.2 05/27/2021    ASSESSMENT & PLAN:  Malignant neoplasm of lower-outer quadrant of right breast of female, estrogen receptor positive ([place]) 07/14/2020:Screening mammogram showed a 1.5cm right breast mass. US showed the 1.4cm mass at the 7 o'clock position, a 2.7cm mass at the 9 o'clock position, and benign right axillary lymph nodes. Biopsy showed ALH at the 9 o'clock position, and at the 7 o'clock position, IDC, grade 3, HER-2 positive (3+), ER+ 20% weak, PR- 0%, Ki67 40%.   Treatment Plan: 1. Neoadjuvant chemotherapy with Taxol Herceptin followed by Herceptin maintenance for 1 year 2. 05/12/2021: Pathologic complete response  Right lumpectomy: LCIS involving CSL, intraductal papilloma negative for IDC or DCIS Right lateral margin lumpectomy: LCIS, ADH Lymph nodes: 3 negative 3. Followed by adjuvant radiation therapy if patient had lumpectomy 4.  Followed by adjuvant antiestrogen therapy ------------------------------------------------------------------------------------------------------------------------------- Current treatment: Herceptin maintenance on hold for low ejection fraction Treatment plan: Adjuvant radiation followed by adjuvant antiestrogen therapy Echocardiogram 05/26/2019: EF 40% I sent a message to cardiology to assistance with figuring out when to resume Herceptin. I do not like to start her on Herceptin at this time because of low ejection  fraction.  If in 3 months also her echocardiogram does not improve then we will permanently discontinue Herceptin. We will discuss starting antiestrogen therapy when we see her back in 3 months for survivorship care plan visit.   No orders of the defined types were placed in this encounter.  The patient has a good understanding of the overall plan. she agrees with it. she will call with any problems that may develop before the next visit here.  Total time spent: 20 mins including face to face time and time spent for planning, charting and coordination of care  Rulon Eisenmenger, MD, MPH 05/27/2021  I, Thana Ates, am acting as scribe for Dr. Nicholas Lose.  I have reviewed the above documentation for accuracy and completeness, and I agree with the above.

## 2021-05-27 ENCOUNTER — Telehealth: Payer: Self-pay | Admitting: *Deleted

## 2021-05-27 ENCOUNTER — Other Ambulatory Visit: Payer: Self-pay | Admitting: *Deleted

## 2021-05-27 ENCOUNTER — Encounter: Payer: Self-pay | Admitting: Cardiology

## 2021-05-27 ENCOUNTER — Inpatient Hospital Stay: Payer: Medicare Other | Attending: Hematology and Oncology

## 2021-05-27 ENCOUNTER — Inpatient Hospital Stay (HOSPITAL_BASED_OUTPATIENT_CLINIC_OR_DEPARTMENT_OTHER): Payer: Medicare Other | Admitting: Hematology and Oncology

## 2021-05-27 ENCOUNTER — Ambulatory Visit (INDEPENDENT_AMBULATORY_CARE_PROVIDER_SITE_OTHER): Payer: Medicare Other | Admitting: Cardiology

## 2021-05-27 ENCOUNTER — Inpatient Hospital Stay: Payer: Medicare Other

## 2021-05-27 ENCOUNTER — Other Ambulatory Visit: Payer: Self-pay

## 2021-05-27 VITALS — BP 118/80 | HR 79 | Ht 65.0 in | Wt 187.0 lb

## 2021-05-27 DIAGNOSIS — Z79899 Other long term (current) drug therapy: Secondary | ICD-10-CM | POA: Diagnosis not present

## 2021-05-27 DIAGNOSIS — I5042 Chronic combined systolic (congestive) and diastolic (congestive) heart failure: Secondary | ICD-10-CM

## 2021-05-27 DIAGNOSIS — D151 Benign neoplasm of heart: Secondary | ICD-10-CM | POA: Diagnosis not present

## 2021-05-27 DIAGNOSIS — C50511 Malignant neoplasm of lower-outer quadrant of right female breast: Secondary | ICD-10-CM | POA: Insufficient documentation

## 2021-05-27 DIAGNOSIS — Z170 Estrogen receptor positive status [ER+]: Secondary | ICD-10-CM | POA: Diagnosis not present

## 2021-05-27 DIAGNOSIS — Z95828 Presence of other vascular implants and grafts: Secondary | ICD-10-CM

## 2021-05-27 LAB — CMP (CANCER CENTER ONLY)
ALT: 18 U/L (ref 0–44)
AST: 23 U/L (ref 15–41)
Albumin: 3.6 g/dL (ref 3.5–5.0)
Alkaline Phosphatase: 106 U/L (ref 38–126)
Anion gap: 9 (ref 5–15)
BUN: 15 mg/dL (ref 8–23)
CO2: 25 mmol/L (ref 22–32)
Calcium: 9.3 mg/dL (ref 8.9–10.3)
Chloride: 107 mmol/L (ref 98–111)
Creatinine: 0.94 mg/dL (ref 0.44–1.00)
GFR, Estimated: >60 mL/min (ref >60)
Glucose, Bld: 105 mg/dL — ABNORMAL HIGH (ref 70–99)
Potassium: 4 mmol/L (ref 3.5–5.1)
Sodium: 141 mmol/L (ref 135–145)
Total Bilirubin: 0.4 mg/dL (ref 0.3–1.2)
Total Protein: 6.7 g/dL (ref 6.5–8.1)

## 2021-05-27 LAB — CBC WITH DIFFERENTIAL (CANCER CENTER ONLY)
Abs Immature Granulocytes: 0.02 10*3/uL (ref 0.00–0.07)
Basophils Absolute: 0.1 10*3/uL (ref 0.0–0.1)
Basophils Relative: 1 %
Eosinophils Absolute: 0.2 10*3/uL (ref 0.0–0.5)
Eosinophils Relative: 3 %
HCT: 37.3 % (ref 36.0–46.0)
Hemoglobin: 12.1 g/dL (ref 12.0–15.0)
Immature Granulocytes: 0 %
Lymphocytes Relative: 35 %
Lymphs Abs: 2 10*3/uL (ref 0.7–4.0)
MCH: 26.3 pg (ref 26.0–34.0)
MCHC: 32.4 g/dL (ref 30.0–36.0)
MCV: 81.1 fL (ref 80.0–100.0)
Monocytes Absolute: 0.4 10*3/uL (ref 0.1–1.0)
Monocytes Relative: 8 %
Neutro Abs: 3.2 10*3/uL (ref 1.7–7.7)
Neutrophils Relative %: 53 %
Platelet Count: 208 10*3/uL (ref 150–400)
RBC: 4.6 MIL/uL (ref 3.87–5.11)
RDW: 14.6 % (ref 11.5–15.5)
WBC Count: 5.9 10*3/uL (ref 4.0–10.5)
nRBC: 0 % (ref 0.0–0.2)

## 2021-05-27 MED ORDER — SODIUM CHLORIDE 0.9% FLUSH
10.0000 mL | Freq: Once | INTRAVENOUS | Status: AC
Start: 2021-05-27 — End: 2021-05-27
  Administered 2021-05-27: 10 mL

## 2021-05-27 MED ORDER — ASPIRIN EC 81 MG PO TBEC: 81.0000 mg | DELAYED_RELEASE_TABLET | Freq: Every day | ORAL | 3 refills | Status: AC

## 2021-05-27 MED ORDER — LOSARTAN POTASSIUM 25 MG PO TABS: 25.0000 mg | ORAL_TABLET | Freq: Every day | ORAL | 3 refills | Status: DC

## 2021-05-27 NOTE — Progress Notes (Signed)
Cardiology Office Note:    Date:  05/27/2021   ID:  Claudia Ortiz, DOB 1940/09/06, MRN [id number]  PCP:  Kelton Pillar, MD   [facility] Providers Cardiologist:  None     Referring MD: Kelton Pillar, MD   CC: Tricuspid valve mass  History of Present Illness:    Claudia Ortiz is a 80 y.o. female with a hx of anxiety, elevated cholesterol, colon polyp, intraductal papilloma of left breast, and tricuspid valve mass who presents for follow-up.  She was refered by Dr. Laurann Montana for pre-op clearance for lumpectomy, initially seen on 12/23/2020.  Echocardiogram on 11/21/2020 showed EF 50 to 55%, normal RV function, echodensity on the tricuspid valve measuring 1 cm, mild TR. Transesophageal echocardiogram on 01/01/2021 showed 1.1 x 0.8 cm mass adherent to the atrial aspect of the posterior tricuspid valve leaflet, likely representing thrombus.  Also with small mobile echodensity on the pulmonary artery aspect of the pulmonary valve, likely representing thrombus.  EF 50%, normal RV function, small pericardial effusion, small PFO.  Lower extremity duplex on 01/02/2021 was negative.  Echocardiogram 04/07/2021 showed EF 35 to 40%, global hypokinesis, and grade 1 diastolic dysfunction, normal RV function, mild MR, unchanged tricuspid valve mass.  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  Since last clinic visit, she reports that she is doing well.  Denies any chest pain, lightheadedness, syncope, lower extremity edema, or palpitations.  Does report some dyspnea.  Has had some LE edema.  She underwent lumpectomy without issues, planning radiation.   Past Medical History:  Diagnosis Date   Anxiety    Arthritis 2019   Asthma    As Child   Cancer ([place]) 05/2020   R breast   Colon polyp    Elevated cholesterol    Intraductal papilloma of  left breast     precancerous cells in R breast   Tricuspid valve mass    on 11/21/20 echo    Past Surgical History:  Procedure Laterality Date   AXILLARY SENTINEL NODE BIOPSY Right 05/12/2021   Procedure: AXILLARY SENTINEL NODE BIOPSY WITH MAGTRACE;  Surgeon: Rolm Bookbinder, MD;  Location: [place] [facility];  Service: General;  Laterality: Right;   BREAST LUMPECTOMY WITH RADIOACTIVE SEED AND SENTINEL LYMPH NODE BIOPSY Right 05/12/2021   Procedure: RIGHT BREAST LUMPECTOMY WITH RADIOACTIVE SEED X3;  Surgeon: Rolm Bookbinder, MD;  Location: [place];  Service: General;  Laterality: Right;   colon polyp     removed   CYST REMOVAL TRUNK     breast    EYE SURGERY Left    cataract removal   FOOT SURGERY     PORTACATH PLACEMENT Left 09/02/2020   Procedure: INSERTION PORT-A-CATH;  Surgeon: Rolm Bookbinder, MD;  Location: [place];  Service: General;  Laterality: Left;   TEE WITHOUT CARDIOVERSION N/A 01/01/2021   Procedure: TRANSESOPHAGEAL ECHOCARDIOGRAM (TEE);  Surgeon: Elouise Munroe, MD;  Location: [facility];  Service: Cardiology;  Laterality: N/A;   TONSILLECTOMY     as a child    Current Medications: Current Meds  Medication Sig   Apoaequorin (PREVAGEN) 10 MG CAPS Take 10 mg by mouth daily.   aspirin EC 81 MG tablet Take 1 tablet (81 mg total) by mouth daily. Swallow whole.   b complex vitamins capsule Take 1 capsule by mouth daily.   Bioflavonoid Products (SUPER C-500 COMPLEX  PO) Take 1 tablet by mouth daily.   Biotin 5000 MCG CAPS Take 5,000 mcg by mouth daily.   cholecalciferol (VITAMIN D3) 25 MCG (1000 UNIT) tablet Take 1,000 Units by mouth daily.   Evening Primrose Oil 500 MG CAPS Take 500 mg by mouth daily.   Garlic 1610 MG CAPS Take 1,000 mg by mouth daily.   lidocaine-prilocaine (EMLA) cream Apply 1 application topically as needed (for port access).   losartan (COZAAR) 25 MG tablet Take 1 tablet (25 mg total) by mouth daily.   metoprolol succinate (TOPROL  XL) 25 MG 24 hr tablet Take 1 tablet (25 mg total) by mouth daily.   Multiple Vitamin (MULTIVITAMIN) tablet Take 1 tablet by mouth daily.   PFIZER-BIONT COVID-19 VAC-TRIS SUSP injection    traMADol (ULTRAM) 50 MG tablet Take 2 tablets (100 mg total) by mouth every 6 (six) hours as needed.   Vitamin A 7.5 MG (25000 UT) CAPS Take 25,000 Units by mouth daily.   [DISCONTINUED] apixaban (ELIQUIS) 5 MG TABS tablet Take 1 tablet (5 mg total) by mouth 2 (two) times daily.     Allergies:   Iodine, Shellfish allergy, Atorvastatin, Cephalosporins, and Penicillins   Social History   Socioeconomic History   Marital status: Married    Spouse name: Not on file   Number of children: Not on file   Years of education: Not on file   Highest education level: Not on file  Occupational History   Not on file  Tobacco Use   Smoking status: Never   Smokeless tobacco: Never  Vaping Use   Vaping Use: Never used  Substance and Sexual Activity   Alcohol use: No   Drug use: No   Sexual activity: Yes  Other Topics Concern   Not on file  Social History Narrative   Not on file   Social Determinants of Health   Financial Resource Strain: Not on file  Food Insecurity: Not on file  Transportation Needs: Not on file  Physical Activity: Not on file  Stress: Not on file  Social Connections: Not on file     Family History: The patient's family history includes Diabetes in her mother; Heart attack in her brother and mother; Heart disease in her father; Stroke in her father.  ROS:   Please see the history of present illness.     All other systems reviewed and are negative.  EKGs/Labs/Other Studies Reviewed:    The following studies were reviewed today:  [place] LE Venous 01/02/2021: RIGHT:  - No evidence of deep vein thrombosis in the lower extremity. No indirect  evidence of obstruction proximal to the inguinal ligament.  - No cystic structure found in the popliteal fossa.     LEFT:  - No evidence of  deep vein thrombosis in the lower extremity. No indirect  evidence of obstruction proximal to the inguinal ligament.  - No cystic structure found in the popliteal fossa.  - Small amount of superficial edema seen at left medial ankle.   TEE 01/01/2021:  1. There is a 1.1 x 0.8 cm mass that appears adherent to the atrial  aspect of the posterior tricuspid valve leaflet. In the setting of  malignancy, this mass likely represents thrombus. . The tricuspid valve is  abnormal.   2. There is a small mobile echodensity on the PA aspect of the pulmonary  valve. This mass likely represents thrombus given findings on tricuspid  valve. . The pulmonic valve was abnormal.   3. Left  ventricular ejection fraction, by estimation, is 50%. The left  ventricle has low normal function.   4. Right ventricular systolic function is normal. The right ventricular  size is normal.   5. No left atrial/left atrial appendage thrombus was detected. The LAA  emptying velocity was 56 cm/s.   6. A small pericardial effusion is present. The pericardial effusion is  in the transverse sinus. There is a small amount of epicardial fat noted  in the transverse sinus freely mobile in pericardial fluid.   7. The mitral valve is normal in structure. Trivial mitral valve  regurgitation.   8. The aortic valve is grossly normal. There is mild calcification of the  aortic valve. Aortic valve regurgitation is not visualized. No aortic  stenosis is present.   9. Evidence of atrial level shunting detected by color flow Doppler.  There is a small patent foramen ovale with predominantly left to right  shunting across the atrial septum. Possible small fenestration in atrial  septum.   Comparison(s): Tricuspid valve mass was present on the study from 11/21/20.  On review of TTE from 07/25/20, this mass may have been present at that time  as well, given similar location of echo density, however more subtle and  image quality did not  optimize.   Conclusion(s)/Recommendation(s): Critical findings reported to Dr.  Gardiner Rhyme and acknowledged at 01/01/21 3:53 pm.   Echo 11/21/2020: 1. Basal inferior hypokinesis. . Left ventricular ejection fraction, by  estimation, is 50 to 55%. The left ventricle has low normal function.   2. Right ventricular systolic function is normal. The right ventricular  size is normal. There is normal pulmonary artery systolic pressure.   3. Trivial mitral valve regurgitation.   4. 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 It measures 11 x 10 mm. Review of echo from Jan 2022 (the only previous echo) the echodensity appears to be present there as well though windows are not as good. Would consider TEE to further evaluate.   5. The aortic valve is normal in structure. Aortic valve regurgitation is  not visualized.   6. The inferior vena cava is normal in size with greater than 50%  respiratory variability, suggesting right atrial pressure of 3 mmHg.  Echo 07/25/2020: 1. Left ventricular ejection fraction, by estimation, is 55 to 60%. The  left ventricle has normal function. The left ventricle has no regional  wall motion abnormalities. Left ventricular diastolic parameters are  consistent with Grade I diastolic  dysfunction (impaired relaxation).   2. Right ventricular systolic function is normal. The right ventricular  size is normal.   3. The mitral valve is normal in structure. Trivial mitral valve  regurgitation. No evidence of mitral stenosis.   4. The aortic valve is tricuspid. Aortic valve regurgitation is not  visualized. No aortic stenosis is present.   Conclusion(s)/Recommendation(s): Normal biventricular function without evidence of hemodynamically significant valvular heart disease.  EKG:   04/13/21: NSR, rate 82, poor R wave progression, Q waves III, aVF 01/05/2021: EKG is not ordered today. 12/23/2020: NSR, rate 89, Low voltage, Nonspecific T wave  flattening  Recent Labs: 05/06/2021: ALT 20; BUN 14; Creatinine 0.85; Hemoglobin 12.2; Platelet Count 207; Potassium 4.2; Sodium 141  Recent Lipid Panel No results found for: CHOL, TRIG, HDL, CHOLHDL, VLDL, LDLCALC, LDLDIRECT    Physical Exam:    VS:  BP 118/80 (BP Location: Left Arm, Patient Position: Sitting, Cuff Size: Normal)   Pulse 79  Ht 5\' 5"  (1.651 m)   Wt 187 lb (84.8 kg)   BMI 31.12 kg/m     Wt Readings from Last 3 Encounters:  05/27/21 187 lb (84.8 kg)  05/12/21 185 lb (83.9 kg)  05/06/21 188 lb 4.8 oz (85.4 kg)     GEN: Well nourished, well developed in no acute distress HEENT: Normal NECK: No JVD; No carotid bruits LYMPHATICS: No lymphadenopathy CARDIAC: RRR, no murmurs, rubs, gallops RESPIRATORY:  Clear to auscultation without rales, wheezing or rhonchi  ABDOMEN: Soft, non-tender, non-distended MUSCULOSKELETAL:  Trace LE edema; No deformity  SKIN: Warm and dry NEUROLOGIC:  Alert and oriented x 3 PSYCHIATRIC:  Normal affect   ASSESSMENT:    1. Chronic combined systolic and diastolic heart failure ([place])   2. Papillary fibroelastoma       PLAN:    Tricuspid valve fibroelastoma: Echocardiogram on 11/21/2020 showed echodensity on the tricuspid valve measuring 1 cm, mild TR.  Transesophageal echocardiogram on 01/01/2021 showed 1.1 x 0.8 cm mass adherent to the atrial aspect of the posterior tricuspid valve leaflet, likely representing thrombus.  Also with small mobile echodensity on the pulmonary artery aspect of the pulmonary valve, likely representing thrombus.  EF 50%, normal RV function, small pericardial effusion, small PFO.  Lower extremity duplex on 01/02/2021 was negative for DVT  Echocardiogram 04/07/2021 showed EF 35 to 40%, global hypokinesis, and grade 1 diastolic dysfunction, normal RV function, mild MR, unchanged tricuspid valve mass.  Stress MRI on 04/18/2021 showed mobile mass attached to septal leaflet of tricuspid valve consistent with papillary  fibroelatoma -Blood cultures negative when mass initially seen.  Initially suspected thrombus, completed 3 months of anticoagulation with repeat echo showing unchanged mass.  Cardiac MRI confirms fibroelastoma -Given right-sided fibroelastoma and considering age and no history of embolic events, do not recommend surgey -Discussed continuing Eliquis, which may have some benefit given fibroelastoma but discussed that there is little data on this.  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  Her preference is to stop the Eliquis and I think this is reasonable.  We will switch to aspirin 81 mg daily  Chronic combined systolic and diastolic heart failure: Echocardiogram 04/07/2021 showed EF 35 to 40%, global hypokinesis, and grade 1 diastolic dysfunction, normal RV function, mild MR, unchanged tricuspid valve mass.  Stress MRI on 04/18/2021 showed no evidence of ischemia on stress perfusion, no LGE, LVEF 43%, RVEF 53%.  Echo 05/25/2021 showed EF 40%, normal RV function, mild to moderate left atrial dilatation, mild right atrial dilatation, 1 cm x 0.9 cm tricuspid valve mass. -Started Toprol-XL 25 mg daily -Add losartan 25 mg daily.  Check BMP in 1 to 2 weeks -We will schedule in pharmacy heart failure clinic to continue to titrate heart failure medications, would add spironolactone and Jardiance as able  Breast cancer: Underwent lumpectomy on 05/12/2021.  Planning radiation  RTC in 3 months   Medication Adjustments/Labs and Tests Ordered: Current medicines are reviewed at length with the patient today.  Concerns regarding medicines are outlined above.  Orders Placed This Encounter  Procedures   Basic metabolic panel      Meds ordered this encounter  Medications   aspirin EC 81 MG tablet    Sig: Take 1 tablet (81 mg total) by mouth daily. Swallow whole.    Dispense:  90 tablet    Refill:  3    losartan (COZAAR) 25 MG  tablet    Sig: Take 1 tablet (25 mg total) by mouth daily.    Dispense:  90 tablet    Refill:  3      Patient Instructions  Medication Instructions:  STOP Eliquis START aspirin 81 mg daily START Losartan 25 mg once daily  *If you need a refill on your cardiac medications before your next appointment, please call your pharmacy*   Lab Work: BMET in 2 weeks (this can be completed when you come for your appointment with the pharmacist)  Follow-Up: At [facility], you and your health needs are our priority.  As part of our continuing mission to provide you with exceptional heart care, we have created designated Provider Care Teams.  These Care Teams include your primary Cardiologist (physician) and Advanced Practice Providers (APPs -  Physician Assistants and Nurse Practitioners) who all work together to provide you with the care you need, when you need it.  We recommend signing up for the patient portal called "MyChart".  Sign up information is provided on this After Visit Summary.  MyChart is used to connect with patients for Virtual Visits (Telemedicine).  Patients are able to view lab/test results, encounter notes, upcoming appointments, etc.  Non-urgent messages can be sent to your provider as well.   To learn more about what you can do with MyChart, go to [email or web address].    Your next appointment:   2 weeks with pharmacist 3 months with Dr. Gardiner Rhyme     Signed, Donato Heinz, MD  05/27/2021 12:05 PM    [place]

## 2021-05-27 NOTE — Assessment & Plan Note (Signed)
07/14/2020:Screening mammogram showed a 1.5cm right breast mass. US showed the 1.4cm mass at the 7 o'clock position, a 2.7cm mass at the 9 o'clock position, and benign right axillary lymph nodes. Biopsy showed ALH at the 9 o'clock position, and at the 7 o'clock position, IDC, grade 3, HER-2 positive (3+), ER+ 20% weak, PR- 0%, Ki67 40%.  Treatment Plan: 1. Neoadjuvant chemotherapy withTaxol Herceptinfollowed by Herceptin maintenance for 1 year 2. 05/12/2021: Pathologic complete response  Right lumpectomy: LCIS involving CSL, intraductal papilloma negative for IDC or DCIS Right lateral margin lumpectomy: LCIS, ADH Lymph nodes: 3 negative 3. Followed by adjuvant radiation therapy if patient had lumpectomy 4.Followed by adjuvant antiestrogen therapy ------------------------------------------------------------------------------------------------------------------------------- Current treatment: Herceptin maintenance on hold for low ejection fraction Treatment plan: Adjuvant radiation followed by adjuvant antiestrogen therapy Echocardiogram 05/26/2019: EF 40%

## 2021-05-27 NOTE — Patient Instructions (Signed)
Medication Instructions:  STOP Eliquis START aspirin 81 mg daily START Losartan 25 mg once daily  *If you need a refill on your cardiac medications before your next appointment, please call your pharmacy*   Lab Work: BMET in 2 weeks (this can be completed when you come for your appointment with the pharmacist)  Follow-Up: At [facility], you and your health needs are our priority.  As part of our continuing mission to provide you with exceptional heart care, we have created designated Provider Care Teams.  These Care Teams include your primary Cardiologist (physician) and Advanced Practice Providers (APPs -  Physician Assistants and Nurse Practitioners) who all work together to provide you with the care you need, when you need it.  We recommend signing up for the patient portal called "MyChart".  Sign up information is provided on this After Visit Summary.  MyChart is used to connect with patients for Virtual Visits (Telemedicine).  Patients are able to view lab/test results, encounter notes, upcoming appointments, etc.  Non-urgent messages can be sent to your provider as well.   To learn more about what you can do with MyChart, go to [email or web address].    Your next appointment:   2 weeks with pharmacist 3 months with Dr. Gardiner Rhyme

## 2021-05-27 NOTE — Telephone Encounter (Signed)
Error

## 2021-06-01 ENCOUNTER — Telehealth: Payer: Self-pay | Admitting: Hematology and Oncology

## 2021-06-01 ENCOUNTER — Encounter: Payer: Self-pay | Admitting: Physical Therapy

## 2021-06-01 ENCOUNTER — Ambulatory Visit: Payer: Medicare Other | Attending: General Surgery | Admitting: Physical Therapy

## 2021-06-01 ENCOUNTER — Other Ambulatory Visit: Payer: Self-pay

## 2021-06-01 DIAGNOSIS — R293 Abnormal posture: Secondary | ICD-10-CM | POA: Diagnosis not present

## 2021-06-01 DIAGNOSIS — M25611 Stiffness of right shoulder, not elsewhere classified: Secondary | ICD-10-CM | POA: Insufficient documentation

## 2021-06-01 DIAGNOSIS — Z483 Aftercare following surgery for neoplasm: Secondary | ICD-10-CM | POA: Diagnosis not present

## 2021-06-01 DIAGNOSIS — Z170 Estrogen receptor positive status [ER+]: Secondary | ICD-10-CM | POA: Insufficient documentation

## 2021-06-01 DIAGNOSIS — C50511 Malignant neoplasm of lower-outer quadrant of right female breast: Secondary | ICD-10-CM | POA: Diagnosis not present

## 2021-06-01 NOTE — Telephone Encounter (Signed)
Discussed with the patient about our conversations with cardiology. They felt that she could resume Herceptin and be monitored closely. However given the fact that she had received significant amount of Herceptin over the past year, the relative benefit of couple of months of additional Herceptin is very low and therefore I recommended that we discontinue further Herceptin treatments and remove the port. Patient is happy and is in agreement with the plan.

## 2021-06-01 NOTE — Patient Instructions (Addendum)
     [facility]  [address], [address]  [place] [address]  [phone]  After Breast Cancer Class It is recommended you attend the ABC class to be educated on lymphedema risk reduction. This class is free of charge and lasts for 1 hour. It is a 1-time class. You are scheduled for Nov. 21st at 11:00. You need to download Webex and we will send you the link in your email that morning.  Scar massage You can begin gentle scar massage by moving the skin different directions on your 2 incisions for a few minutes. Don't rub the area - just give enough pressure to get the skin to move in various directions. Then gently massage some coconut oil on the area.  Compression garment Continue wearing a bra with good support/compression.  Home exercise Program It's important for you be doing the stretches twice a day until you can do them without tightness.  Follow up PT: It is recommended you return every 3 months for the first 3 years following surgery to be assessed on the SOZO machine for an L-Dex score. This helps prevent clinically significant lymphedema in 95% of patients. These follow up screens are 10 minute appointments that you are not billed for. You are scheduled for January 26th, 2023 at 11:00.

## 2021-06-01 NOTE — Therapy (Signed)
[facility] @ [place], [place], [address] Phone: [phone]   Fax:  [phone]  Physical Therapy Treatment  Patient Details  Name: Claudia Ortiz MRN: [id number] Date of Birth: 1941-02-10 Referring Provider (PT): Dr. Rolm Bookbinder   Encounter Date: 06/01/2021   PT End of Session - 06/01/21 1039     Visit Number 2    Number of Visits 10    Date for PT Re-Evaluation 06/29/21    PT Start Time 1001    PT Stop Time 1040    PT Time Calculation (min) 39 min    Activity Tolerance Patient tolerated treatment well    Behavior During Therapy [facility] for tasks assessed/performed             Past Medical History:  Diagnosis Date   Anxiety    Arthritis 2019   Asthma    As Child   Cancer ([place]) 05/2020   R breast   Colon polyp    Elevated cholesterol    Intraductal papilloma of left breast     precancerous cells in R breast   Tricuspid valve mass    on 11/21/20 echo    Past Surgical History:  Procedure Laterality Date   AXILLARY SENTINEL NODE BIOPSY Right 05/12/2021   Procedure: AXILLARY SENTINEL NODE BIOPSY WITH MAGTRACE;  Surgeon: Rolm Bookbinder, MD;  Location: [facility];  Service: General;  Laterality: Right;   BREAST LUMPECTOMY WITH RADIOACTIVE SEED AND SENTINEL LYMPH NODE BIOPSY Right 05/12/2021   Procedure: RIGHT BREAST LUMPECTOMY WITH RADIOACTIVE SEED X3;  Surgeon: Rolm Bookbinder, MD;  Location: [facility];  Service: General;  Laterality: Right;   colon polyp     removed   CYST REMOVAL TRUNK     breast    EYE SURGERY Left    cataract removal   FOOT SURGERY     PORTACATH PLACEMENT Left 09/02/2020   Procedure: INSERTION PORT-A-CATH;  Surgeon: Rolm Bookbinder, MD;  Location: [facility];  Service: General;  Laterality: Left;   TEE WITHOUT CARDIOVERSION N/A 01/01/2021   Procedure: TRANSESOPHAGEAL ECHOCARDIOGRAM (TEE);  Surgeon: Elouise Munroe, MD;  Location: [facility];   Service: Cardiology;  Laterality: N/A;   TONSILLECTOMY     as a child    There were no vitals filed for this visit.   Subjective Assessment - 06/01/21 1005     Subjective Patient reports she underwent neoadjuvant chemotherapy 09/03/2020 - 11/19/2020 followed by a right lumpectomy and sentinel node biopsy (3 negative nodes) on 05/12/2021. Herceptin is on hold due to reduced ejection fraction. She will possibly undergo radiation and anti-estrogen therapy.    Pertinent History Patient was diagnosed on 06/17/2020 with right grade III invasive ductal carcinoma breast cancer. She underwent neoadjuvant chemotherapy 09/03/2020 - 11/19/2020 followed by a right lumpectomy and sentinel node biopsy (3 negative nodes) on 10/25/202. It is ER positive, PR negative and HER2 positive with a Ki67 of 40%.    Patient Stated Goals See if my arm is doing ok    Currently in Pain? Yes    Pain Score 3     Pain Location Axilla    Pain Orientation Right    Pain Descriptors / Indicators Shooting    Pain Type Surgical pain    Pain Onset 1 to 4 weeks ago    Pain Frequency Intermittent    Aggravating Factors  Woke up with some shooting pains in my right breast    Pain Relieving Factors Unknown  [facility] PT Assessment - 06/01/21 0001       Assessment   Medical Diagnosis s/p right lumpectomy and SLNB    Referring Provider (PT) Dr. Rolm Bookbinder    Onset Date/Surgical Date 05/12/21    Hand Dominance Right    Prior Therapy Baselines      Precautions   Precautions Other (comment)    Precaution Comments recent surgery and right arm lymphedema risk      Restrictions   Weight Bearing Restrictions No      Balance Screen   Has the patient fallen in the past 6 months No    Has the patient had a decrease in activity level because of a fear of falling?  No    Is the patient reluctant to leave their home because of a fear of falling?  No      Home Ecologist residence     Living Arrangements Spouse/significant other    Available Help at Discharge Family      Prior Function   Level of [place] Retired    Leisure She has not been walking due to her right foot problem      Cognition   Overall Cognitive Status Within Functional Limits for tasks assessed      Observation/Other Assessments   Observations Right breast incision with scar tissue present which may be contributing to her right inverted nipple. no redness or edema noted. Incisions are well healed.      Posture/Postural Control   Posture/Postural Control Postural limitations    Postural Limitations Rounded Shoulders;Forward head      ROM / Strength   AROM / PROM / Strength AROM;Strength      AROM   AROM Assessment Site Shoulder    Right/Left Shoulder Right    Right Shoulder Extension 50 Degrees    Right Shoulder Flexion 137 Degrees    Right Shoulder ABduction 144 Degrees    Right Shoulder Internal Rotation 31 Degrees    Right Shoulder External Rotation 71 Degrees               LYMPHEDEMA/ONCOLOGY QUESTIONNAIRE - 06/01/21 0001       Type   Cancer Type Right breast cancer      Surgeries   Lumpectomy Date 05/12/21    Sentinel Lymph Node Biopsy Date 05/12/21    Number Lymph Nodes Removed 3      Treatment   Active Chemotherapy Treatment No    Past Chemotherapy Treatment Yes    Date 11/19/20    Active Radiation Treatment No    Past Radiation Treatment No    Current Hormone Treatment No    Past Hormone Therapy No      What other symptoms do you have   Are you Having Heaviness or Tightness Yes    Are you having Pain Yes    Are you having pitting edema No    Is it Hard or Difficult finding clothes that fit No    Do you have infections No    Is there Decreased scar mobility Yes    [place] Sign No      Lymphedema Assessments   Lymphedema Assessments Upper extremities      Right Upper Extremity Lymphedema   10 cm Proximal to Olecranon Process  29.3 cm    Olecranon Process 24.8 cm    10 cm Proximal to Ulnar Styloid Process 21 cm    Just Proximal to Ulnar Styloid  Process 15.1 cm    Across Hand at [employer] 20.2 cm    At [place] of 2nd Digit 6.6 cm      Left Upper Extremity Lymphedema   10 cm Proximal to Olecranon Process 30 cm    Olecranon Process 25.5 cm    10 cm Proximal to Ulnar Styloid Process 21.1 cm    Just Proximal to Ulnar Styloid Process 15.5 cm    Across Hand at [employer] 19.5 cm    At [facility] of 2nd Digit 6.6 cm                Katina Dung - 06/01/21 0001     Open a tight or new jar Severe difficulty    Do heavy household chores (wash walls, wash floors) Moderate difficulty    Carry a shopping bag or briefcase Mild difficulty    Wash your back Moderate difficulty    Use a knife to cut food Moderate difficulty    Recreational activities in which you take some force or impact through your arm, shoulder, or hand (golf, hammering, tennis) Moderate difficulty    During the past week, to what extent has your arm, shoulder or hand problem interfered with your normal social activities with family, friends, neighbors, or groups? Slightly    During the past week, to what extent has your arm, shoulder or hand problem limited your work or other regular daily activities Slightly    Arm, shoulder, or hand pain. Severe    Tingling (pins and needles) in your arm, shoulder, or hand None    Difficulty Sleeping Mild difficulty    DASH Score 40.91 %                             PT Education - 06/01/21 1039     Education Details Aftercare; scar massage    Person(s) Educated Patient    Methods Explanation;Demonstration;Handout    Comprehension Verbalized understanding;Returned demonstration                 PT Long Term Goals - 06/01/21 1049       PT LONG TERM GOAL #1   Title Patient will demonstrate she has regained full shoulder ROM and function post operatively compared to baselines.     Time 4    Period Weeks    Status On-going    Target Date 06/29/21      PT LONG TERM GOAL #2   Title Patient will increase right shoulder flexion and abduction to >/= 150 degrees for increase ease reaching and obtaining radiation positioning.    Time 4    Period Weeks    Status New    Target Date 06/29/21      PT LONG TERM GOAL #3   Title Patient will improve her DASH score to be back to zero as this was her baseline to improve overall UE function.    Time 4    Period Weeks    Status New    Target Date 06/29/21      PT LONG TERM GOAL #4   Title Patient will verbalize good understanding of lymphedema risk reduction practices.    Time 4    Period Weeks    Status New    Target Date 06/29/21                   Plan - 06/01/21 1039  Clinical Impression Statement Patient is doing well s/p right lumpectomy and SLNB on 05/12/2021. She completed neoadjuvant chemotherapy and was planning to continue herceptin but that is on hold due to cardiac concerns. She has not regained shoulder ROM and has some scar tissue issues at her incision sites so she will benefit from PT to address those concerns. She is alos possibly starting radiation soon and would benefit from less tightness in her shoulder to better tolerate that.    PT Frequency 2x / week    PT Duration 4 weeks    PT Treatment/Interventions ADLs/Self Care Home Management;Therapeutic exercise;Manual techniques;Manual lymph drainage;Therapeutic activities;Passive range of motion;Patient/family education;Scar mobilization    PT Next Visit Plan Scar massage to both incision sites; PROM and ROM exercises right shoulder    PT Home Exercise Plan Post op HEP    Consulted and Agree with Plan of Care Patient             Patient will benefit from skilled therapeutic intervention in order to improve the following deficits and impairments:  Postural dysfunction, Decreased range of motion, Decreased knowledge of precautions,  Impaired UE functional use, Pain, Decreased scar mobility  Visit Diagnosis: Malignant neoplasm of lower-outer quadrant of right breast of female, estrogen receptor positive ([place]) - Plan: PT plan of care cert/re-cert  Abnormal posture - Plan: PT plan of care cert/re-cert  Stiffness of right shoulder, not elsewhere classified - Plan: PT plan of care cert/re-cert  Aftercare following surgery for neoplasm - Plan: PT plan of care cert/re-cert     Problem List Patient Active Problem List   Diagnosis Date Noted   Tricuspid valve mass    Port-A-Cath in place 09/10/2020   Malignant neoplasm of lower-outer quadrant of right breast of female, estrogen receptor positive ([place]) 07/14/2020   Dyspnea 02/18/2014   Fatigue 02/18/2014   Abnormal ECG 02/18/2014   History of left breast cancer 02/18/2014   Hyperlipidemia 02/18/2014   Annia Friendly, PT 06/01/21 10:52 AM   [facility] @ [place], [place], [address] Phone: [phone]   Fax:  [phone]  Name: KIERSTYNN BABICH MRN: [id number] Date of Birth: 03-29-1941

## 2021-06-09 ENCOUNTER — Ambulatory Visit
Admission: RE | Admit: 2021-06-09 | Discharge: 2021-06-09 | Disposition: A | Discharge status: Home / Self Care | Payer: Medicare Other | Source: Ambulatory Visit | Attending: Radiation Oncology | Admitting: Radiation Oncology

## 2021-06-09 ENCOUNTER — Ambulatory Visit: Payer: Medicare Other | Admitting: Radiation Oncology

## 2021-06-09 ENCOUNTER — Institutional Professional Consult (permissible substitution): Payer: Self-pay | Admitting: Radiation Oncology

## 2021-06-09 ENCOUNTER — Encounter: Payer: Self-pay | Admitting: Radiation Oncology

## 2021-06-09 ENCOUNTER — Other Ambulatory Visit: Payer: Self-pay

## 2021-06-09 VITALS — BP 135/73 | HR 78 | Temp 96.5°F | Resp 18 | Ht 65.0 in | Wt 189.2 lb

## 2021-06-09 DIAGNOSIS — E7800 Pure hypercholesterolemia, unspecified: Secondary | ICD-10-CM | POA: Insufficient documentation

## 2021-06-09 DIAGNOSIS — J45909 Unspecified asthma, uncomplicated: Secondary | ICD-10-CM | POA: Insufficient documentation

## 2021-06-09 DIAGNOSIS — G629 Polyneuropathy, unspecified: Secondary | ICD-10-CM | POA: Insufficient documentation

## 2021-06-09 DIAGNOSIS — Z79899 Other long term (current) drug therapy: Secondary | ICD-10-CM | POA: Diagnosis not present

## 2021-06-09 DIAGNOSIS — Z170 Estrogen receptor positive status [ER+]: Secondary | ICD-10-CM | POA: Diagnosis not present

## 2021-06-09 DIAGNOSIS — Z7982 Long term (current) use of aspirin: Secondary | ICD-10-CM | POA: Diagnosis not present

## 2021-06-09 DIAGNOSIS — C50511 Malignant neoplasm of lower-outer quadrant of right female breast: Secondary | ICD-10-CM | POA: Diagnosis not present

## 2021-06-09 DIAGNOSIS — Z86010 Personal history of colonic polyps: Secondary | ICD-10-CM | POA: Diagnosis not present

## 2021-06-09 NOTE — Progress Notes (Signed)
Radiation Oncology         [phone] ________________________________  Name: Claudia Ortiz        MRN: [id number]  Date of Service: 06/09/2021 DOB: [birth year]  FX:[id number], Claudia Sheffield, MD  Nicholas Lose, MD     REFERRING PHYSICIAN: Nicholas Lose, MD   DIAGNOSIS: The encounter diagnosis was Malignant neoplasm of lower-outer quadrant of right breast of female, estrogen receptor positive ([place]).   HISTORY OF PRESENT ILLNESS: Claudia Ortiz is a 80 y.o. female originally seen in the multidisciplinary breast clinic for a new diagnosis of right breast cancer. The patient was noted to have a screening detected mass with associated calcifications in the right breast.  Findings were noted in the lower outer quadrant and on further diagnostic imaging, a 1.6 cm mass was seen at 7:00, and associated 2.7 cm mass was also seen at 9:00, her axilla was negative for adenopathy.  She underwent biopsy on 07/08/2020 the 9 o'clock position mass was consistent with complex sclerosing lesion with atypical lobular hyperplasia.  The 7:00 biopsy revealed a grade 3 invasive ductal carcinoma that was ER positive PR negative, HER-2 amplified with a Ki-67 of 40%.    Since her last visit she did undergo MRI which showed an 8 mm retroareolar finding as well as non-mass enhancement and further biopsies confirmed high-grade DCIS that was ER/PR negative.  Given these findings she began systemic chemotherapy between 09/03/2020 and 11/19/2020 associated with anti-HER2 therapy.  She was going to go for lumpectomy with sentinel lymph node biopsy in June but during her preop work-up was found to have an EKG abnormality and subsequent work-up with cardiology revealed concerns for tricuspid changes that were driving this finding.  She has continued to be followed by cardiology and has been given approval to resume anti-HER2 therapy and her last dose of this was on 03/25/2021.  She did undergo right lumpectomy on 05/12/2021 and final pathology  revealed LCIS within the breast specimens without evidence of DCIS or invasive breast cancer.  5 sampled lymph nodes were all negative for disease as well.  She is seen to discuss adjuvant radiotherapy.   PREVIOUS RADIATION THERAPY: No   PAST MEDICAL HISTORY:  Past Medical History:  Diagnosis Date   Anxiety    Arthritis 2019   Asthma    As Child   Cancer ([place]) 05/2020   R breast   Colon polyp    Elevated cholesterol    Intraductal papilloma of left breast     precancerous cells in R breast   Tricuspid valve mass    on 11/21/20 echo       PAST SURGICAL HISTORY: Past Surgical History:  Procedure Laterality Date   AXILLARY SENTINEL NODE BIOPSY Right 05/12/2021   Procedure: AXILLARY SENTINEL NODE BIOPSY WITH MAGTRACE;  Surgeon: Rolm Bookbinder, MD;  Location: [facility];  Service: General;  Laterality: Right;   BREAST LUMPECTOMY WITH RADIOACTIVE SEED AND SENTINEL LYMPH NODE BIOPSY Right 05/12/2021   Procedure: RIGHT BREAST LUMPECTOMY WITH RADIOACTIVE SEED X3;  Surgeon: Rolm Bookbinder, MD;  Location: [facility];  Service: General;  Laterality: Right;   colon polyp     removed   CYST REMOVAL TRUNK     breast    EYE SURGERY Left    cataract removal   FOOT SURGERY     PORTACATH PLACEMENT Left 09/02/2020   Procedure: INSERTION PORT-A-CATH;  Surgeon: Rolm Bookbinder, MD;  Location: [facility];  Service: General;  Laterality: Left;   TEE WITHOUT  CARDIOVERSION N/A 01/01/2021   Procedure: TRANSESOPHAGEAL ECHOCARDIOGRAM (TEE);  Surgeon: Elouise Munroe, MD;  Location: [facility];  Service: Cardiology;  Laterality: N/A;   TONSILLECTOMY     as a child     FAMILY HISTORY:  Family History  Problem Relation Age of Onset   Diabetes Mother    Heart attack Mother    Stroke Father    Heart disease Father    Heart attack Brother      SOCIAL HISTORY:  reports that she has never smoked. She has never used smokeless tobacco. She reports that she does not drink alcohol  and does not use drugs.  The patient is married and resides in [place]. She and her husband are active in their [employer] and have an adult son who lives in [place].   ALLERGIES: Iodine, Shellfish allergy, Atorvastatin, Cephalosporins, and Penicillins   MEDICATIONS:  Current Outpatient Medications  Medication Sig Dispense Refill   Apoaequorin (PREVAGEN) 10 MG CAPS Take 10 mg by mouth daily.     aspirin EC 81 MG tablet Take 1 tablet (81 mg total) by mouth daily. Swallow whole. 90 tablet 3   b complex vitamins capsule Take 1 capsule by mouth daily.     Bioflavonoid Products (SUPER C-500 COMPLEX PO) Take 1 tablet by mouth daily.     Biotin 5000 MCG CAPS Take 5,000 mcg by mouth daily.     cholecalciferol (VITAMIN D3) 25 MCG (1000 UNIT) tablet Take 1,000 Units by mouth daily.     Evening Primrose Oil 500 MG CAPS Take 500 mg by mouth daily.     Garlic 7681 MG CAPS Take 1,000 mg by mouth daily.     lidocaine-prilocaine (EMLA) cream Apply 1 application topically as needed (for port access).     losartan (COZAAR) 25 MG tablet Take 1 tablet (25 mg total) by mouth daily. 90 tablet 3   metoprolol succinate (TOPROL XL) 25 MG 24 hr tablet Take 1 tablet (25 mg total) by mouth daily. 90 tablet 3   Multiple Vitamin (MULTIVITAMIN) tablet Take 1 tablet by mouth daily.     PFIZER-BIONT COVID-19 VAC-TRIS SUSP injection      traMADol (ULTRAM) 50 MG tablet Take 2 tablets (100 mg total) by mouth every 6 (six) hours as needed. 10 tablet 0   Vitamin A 7.5 MG (25000 UT) CAPS Take 25,000 Units by mouth daily.     No current facility-administered medications for this encounter.     REVIEW OF SYSTEMS: On review of systems, the patient reports that she is doing well overall. She is doing well. Her left foot still has some neuropathy but this could have been from other orthopedic issues but she felt like she tolerated her chemotherapy otherwise quite well. She has had some soreness and at times sharp pains in  the breast since her surgery. No other complaints are noted.     PHYSICAL EXAM:  Wt Readings from Last 3 Encounters:  05/27/21 187 lb 9.6 oz (85.1 kg)  05/27/21 187 lb (84.8 kg)  05/12/21 185 lb (83.9 kg)   Temp Readings from Last 3 Encounters:  05/27/21 (!) 97.5 F (36.4 C) (Temporal)  05/12/21 97.6 F (36.4 C)  05/06/21 (!) 97.3 F (36.3 C) (Temporal)   BP Readings from Last 3 Encounters:  05/27/21 (!) 113/58  05/27/21 118/80  05/12/21 123/63   Pulse Readings from Last 3 Encounters:  05/27/21 82  05/27/21 79  05/12/21 63    In general this is a  well appearing African-American female in no acute distress. She's alert and oriented x4 and appropriate throughout the examination. Cardiopulmonary assessment is negative for acute distress and she exhibits normal effort.  Her right breast reveals a well-healed surgical incision site as well as of her axillary incision site on the right.  No erythema, or separation or drainage is noted of either incision.    ECOG = 1  0 - Asymptomatic (Fully active, able to carry on all predisease activities without restriction)  1 - Symptomatic but completely ambulatory (Restricted in physically strenuous activity but ambulatory and able to carry out work of a light or sedentary nature. For example, light housework, office work)  2 - Symptomatic, <50% in bed during the day (Ambulatory and capable of all self care but unable to carry out any work activities. Up and about more than 50% of waking hours)  3 - Symptomatic, >50% in bed, but not bedbound (Capable of only limited self-care, confined to bed or chair 50% or more of waking hours)  4 - Bedbound (Completely disabled. Cannot carry on any self-care. Totally confined to bed or chair)  5 - Death   Eustace Pen MM, Creech RH, Tormey DC, et al. [phone]). "Toxicity and response criteria of the [facility]". [place] Oncol. 5 (6): 649-55    LABORATORY DATA:  Lab Results   Component Value Date   WBC 5.9 05/27/2021   HGB 12.1 05/27/2021   HCT 37.3 05/27/2021   MCV 81.1 05/27/2021   PLT 208 05/27/2021   Lab Results  Component Value Date   NA 141 05/27/2021   K 4.0 05/27/2021   CL 107 05/27/2021   CO2 25 05/27/2021   Lab Results  Component Value Date   ALT 18 05/27/2021   AST 23 05/27/2021   ALKPHOS 106 05/27/2021   BILITOT 0.4 05/27/2021      RADIOGRAPHY: ECHOCARDIOGRAM COMPLETE  Result Date: 05/25/2021    ECHOCARDIOGRAM REPORT   Patient Name:   Claudia Ortiz [facility] Date of Exam: 05/25/2021 Medical Rec #:  [id number]        Height:       65.0 in Accession #:    [id number]       Weight:       185.0 lb Date of Birth:  08/03/1940        BSA:          1.914 m Patient Age:    80 years         BP:           123/63 mmHg Patient Gender: F                HR:           63 bpm. Exam Location:  Outpatient Procedure: 2D Echo, Cardiac Doppler, Strain Analysis and Color Doppler Indications:    Chemo Z09  History:        Patient has prior history of Echocardiogram examinations, most                 recent 04/07/2021. Risk Factors:Dyslipidemia. Breast cancer.                 Tricuspid papillary fibroelstoma.  Sonographer:    Darlina Sicilian RDCS Referring Phys: [id number] [place]  1. Left ventricular ejection fraction, by estimation, is 40%. The left ventricle has moderately decreased function. The left ventricle has no regional wall motion abnormalities. The left ventricular internal cavity size was mildly  dilated. Left ventricular diastolic parameters are consistent with Grade I diastolic dysfunction (impaired relaxation). The average left ventricular global longitudinal strain is -15.9 %.  2. Right ventricular systolic function is normal. The right ventricular size is normal. There is normal pulmonary artery systolic pressure.  3. Left atrial size was mild to moderately dilated.  4. Right atrial size was mildly dilated.  5. The mitral valve is normal in structure. No  evidence of mitral valve regurgitation. No evidence of mitral stenosis.  6. There is a 1 cm x 0.9 cm rounded mass on the atrial side of the tricuspid valve. The tricuspid valve is abnormal.  7. The aortic valve is normal in structure. There is mild calcification of the aortic valve. Aortic valve regurgitation is not visualized. No aortic stenosis is present.  8. The inferior vena cava is normal in size with greater than 50% respiratory variability, suggesting right atrial pressure of 3 mmHg. FINDINGS  Left Ventricle: Left ventricular ejection fraction, by estimation, is 40%. The left ventricle has moderately decreased function. The left ventricle has no regional wall motion abnormalities. The average left ventricular global longitudinal strain is -15.9 %. The left ventricular internal cavity size was mildly dilated. There is no left ventricular hypertrophy. Left ventricular diastolic function could not be evaluated due to atrial fibrillation. Left ventricular diastolic parameters are consistent with Grade I diastolic dysfunction (impaired relaxation). Right Ventricle: The right ventricular size is normal. No increase in right ventricular wall thickness. Right ventricular systolic function is normal. There is normal pulmonary artery systolic pressure. The tricuspid regurgitant velocity is 2.06 m/s, and  with an assumed right atrial pressure of 3 mmHg, the estimated right ventricular systolic pressure is 36.1 mmHg. Left Atrium: Left atrial size was mild to moderately dilated. Right Atrium: Right atrial size was mildly dilated. Pericardium: There is no evidence of pericardial effusion. Mitral Valve: The mitral valve is normal in structure. No evidence of mitral valve regurgitation. No evidence of mitral valve stenosis. Tricuspid Valve: There is a 1 cm x 0.9 cm rounded mass on the atrial side of the tricuspid valve. The tricuspid valve is abnormal. Tricuspid valve regurgitation is not demonstrated. No evidence of  tricuspid stenosis. Aortic Valve: The aortic valve is normal in structure. There is mild calcification of the aortic valve. Aortic valve regurgitation is not visualized. No aortic stenosis is present. Pulmonic Valve: The pulmonic valve was normal in structure. Pulmonic valve regurgitation is trivial. No evidence of pulmonic stenosis. Aorta: The aortic root is normal in size and structure. Venous: The inferior vena cava is normal in size with greater than 50% respiratory variability, suggesting right atrial pressure of 3 mmHg. IAS/Shunts: No atrial level shunt detected by color flow Doppler.  LEFT VENTRICLE PLAX 2D LVIDd:         5.10 cm      Diastology LVIDs:         3.70 cm      LV e' medial:    3.92 cm/s LV PW:         0.80 cm      LV E/e' medial:  9.5 LV IVS:        1.00 cm      LV e' lateral:   4.90 cm/s LVOT diam:     2.20 cm      LV E/e' lateral: 7.6 LV SV:         59 LV SV Index:   31  2D Longitudinal Strain LVOT Area:     3.80 cm     2D Strain GLS Avg:     -15.9 %  LV Volumes (MOD) LV vol d, MOD A2C: 110.0 ml LV vol d, MOD A4C: 113.0 ml LV vol s, MOD A2C: 71.0 ml LV vol s, MOD A4C: 58.9 ml LV SV MOD A2C:     39.0 ml LV SV MOD A4C:     113.0 ml LV SV MOD BP:      45.7 ml RIGHT VENTRICLE RV S prime:     11.40 cm/s TAPSE (M-mode): 2.2 cm LEFT ATRIUM             Index        RIGHT ATRIUM           Index LA diam:        2.60 cm 1.36 cm/m   RA Area:     11.80 cm LA Vol (A2C):   38.0 ml 19.86 ml/m  RA Volume:   25.30 ml  13.22 ml/m LA Vol (A4C):   33.5 ml 17.51 ml/m LA Biplane Vol: 38.5 ml 20.12 ml/m  AORTIC VALVE LVOT Vmax:   70.50 cm/s LVOT Vmean:  52.900 cm/s LVOT VTI:    0.155 m  AORTA Ao Root diam: 3.10 cm Ao Asc diam:  2.90 cm MITRAL VALVE               TRICUSPID VALVE MV Area (PHT): 3.08 cm    TR Peak grad:   17.0 mmHg MV Decel Time: 246 msec    TR Vmax:        206.00 cm/s MV E velocity: 37.30 cm/s MV A velocity: 87.00 cm/s  SHUNTS MV E/A ratio:  0.43        Systemic VTI:  0.16 m                             Systemic Diam: 2.20 cm Glori Bickers MD Electronically signed by Glori Bickers MD Signature Date/Time: 05/25/2021/12:41:51 PM    Final        IMPRESSION/PLAN: 1. Stage IA, cT1cN0M0 grade 3, ER positive, HER2 amplified invasive ductal carcinoma of the right breast with complete pathologic response following neoadjuvant chemotherapy. Dr. Chapman Moss has reviewed her case.  Even with such a great pathology report, Dr. Lisbeth Renshaw would recommend adjuvant external radiotherapy to the breast  to reduce risks of local recurrence. We discussed the risks, benefits, short, and long term effects of radiotherapy, as well as the curative intent, and the patient is interested in proceeding. Dr. Lisbeth Renshaw recommends 4 weeks of radiotherapy. She would like to consider her options of therapy and we will cancel today's planned simulation. I will call her next week to see if she's made a decision regarding therapy and if she wants to proceed reschedule simulation.  In a visit lasting 60 minutes, greater than 50% of the time was spent face to face reviewing her case, as well as in preparation of, discussing, and coordinating the patient's care.    Carola Rhine, PAC

## 2021-06-09 NOTE — Progress Notes (Signed)
Patient reports occasional sharp RT breast/ axilla pain 3/10, w/ some mild tenderness. No other symptoms reported at this time.  Meaningful use complete.  Postmenopausal- No chances of pregnancy.  BP 135/73 (BP Location: Left Arm, Patient Position: Sitting)   Pulse 78   Temp (!) 96.5 F (35.8 C) (Temporal)   Resp 18   Ht 5\' 5"  (1.651 m)   Wt 189 lb 4 oz (85.8 kg)   SpO2 98%   BMI 31.49 kg/m

## 2021-06-10 ENCOUNTER — Other Ambulatory Visit: Payer: Medicare Other

## 2021-06-10 ENCOUNTER — Ambulatory Visit (INDEPENDENT_AMBULATORY_CARE_PROVIDER_SITE_OTHER): Payer: Medicare Other | Admitting: Pharmacist

## 2021-06-10 ENCOUNTER — Institutional Professional Consult (permissible substitution): Payer: Medicare Other | Admitting: Internal Medicine

## 2021-06-10 VITALS — BP 110/70 | HR 74 | Wt 189.0 lb

## 2021-06-10 DIAGNOSIS — I5042 Chronic combined systolic (congestive) and diastolic (congestive) heart failure: Secondary | ICD-10-CM

## 2021-06-10 DIAGNOSIS — E785 Hyperlipidemia, unspecified: Secondary | ICD-10-CM

## 2021-06-10 NOTE — Progress Notes (Signed)
Patient ID: Claudia Ortiz                 DOB: September 23, 1940                      MRN: [id number]     HPI: Claudia Ortiz is a 80 y.o. female referred by Dr. Gardiner Rhyme to pharmacy clinic for HF medication management. PMH is significant for  anxiety, elevated cholesterol, colon polyp, intraductal papilloma of left breast, and tricuspid valve mass ( fibroelastoma). Most recent LVEF 35-40% on 04/07/21. She was dx with breast cancer. Underwent chemo with taxol and herceptin. Recently underwent lumpectomy with sentinel node biopsy. Recovering well. Herceptin on hold due to EF.  Today she presents to pharmacy clinic for further medication titration. She is accompanied by her husband. At last visit with MD, losartan 25mg  daily was started. Symptomatically, she is feeling good, denies dizziness, lightheadedness. Slight fatigue, but she is still very active. Denies chest pain or palpitations. Feels slightly SOB when she walks for a while. Able to complete all ADLs. Activity level good. Denies LEE, PND, or orthopnea. Very mild foot swelling. Has a hx of foot swelling since chemo. Appetite has been good.   Current CHF meds: metoprolol succinate 25mg  daily, losartan 25mg  daily BP goal: <130/80  Family History: The patient's family history includes Diabetes in her mother; Heart attack in her brother and mother; Heart disease in her father; Stroke in her father.  Social History: never smokes, rarely ETOH  Diet: not addressed today  Exercise: walks some, wants to increase  Home BP readings: no home BP  Wt Readings from Last 3 Encounters:  06/09/21 189 lb 4 oz (85.8 kg)  05/27/21 187 lb 9.6 oz (85.1 kg)  05/27/21 187 lb (84.8 kg)   BP Readings from Last 3 Encounters:  06/09/21 135/73  05/27/21 (!) 113/58  05/27/21 118/80   Pulse Readings from Last 3 Encounters:  06/09/21 78  05/27/21 82  05/27/21 79    Renal function: Estimated Creatinine Clearance: 50.8 mL/min (by C-G formula based on SCr of  0.94 mg/dL).  Past Medical History:  Diagnosis Date   Anxiety    Arthritis 2019   Asthma    As Child   Cancer ([place]) 05/2020   R breast   Colon polyp    Elevated cholesterol    Intraductal papilloma of left breast     precancerous cells in R breast   Tricuspid valve mass    on 11/21/20 echo    Current Outpatient Medications on File Prior to Visit  Medication Sig Dispense Refill   Apoaequorin (PREVAGEN) 10 MG CAPS Take 10 mg by mouth daily.     aspirin EC 81 MG tablet Take 1 tablet (81 mg total) by mouth daily. Swallow whole. 90 tablet 3   b complex vitamins capsule Take 1 capsule by mouth daily.     Bioflavonoid Products (SUPER C-500 COMPLEX PO) Take 1 tablet by mouth daily.     Biotin 5000 MCG CAPS Take 5,000 mcg by mouth daily.     cholecalciferol (VITAMIN D3) 25 MCG (1000 UNIT) tablet Take 1,000 Units by mouth daily.     Evening Primrose Oil 500 MG CAPS Take 500 mg by mouth daily.     Garlic 5638 MG CAPS Take 1,000 mg by mouth daily.     lidocaine-prilocaine (EMLA) cream Apply 1 application topically as needed (for port access).     losartan (COZAAR) 25 MG tablet Take  1 tablet (25 mg total) by mouth daily. 90 tablet 3   metoprolol succinate (TOPROL XL) 25 MG 24 hr tablet Take 1 tablet (25 mg total) by mouth daily. 90 tablet 3   Multiple Vitamin (MULTIVITAMIN) tablet Take 1 tablet by mouth daily.     PFIZER-BIONT COVID-19 VAC-TRIS SUSP injection      traMADol (ULTRAM) 50 MG tablet Take 2 tablets (100 mg total) by mouth every 6 (six) hours as needed. 10 tablet 0   Vitamin A 7.5 MG (25000 UT) CAPS Take 25,000 Units by mouth daily.     No current facility-administered medications on file prior to visit.    Allergies  Allergen Reactions   Iodine Anaphylaxis   Shellfish Allergy Anaphylaxis    Shrimp   Atorvastatin     Muscle aches    Cephalosporins     unkn    Penicillins     Unkn     Assessment/Plan:  1. CHF - BP is adequate today in clinic. We discussed the purpose  of the 4 pillars of HF medications. Will check BMP today also checking lipid panel per pt request. She is fasting. If scr and K stable will plan to add spironolactone 12.5mg  daily. I will call pt on Monday with results. Advised patient to check her blood pressure once a day at home. Husband has a cuff. Discussed SGLT2 and cost. Patient will let us know if she can afford or if she would like to pursue patient assistance. Will follow up in clinic in 3 weeks.  Thank you,  Ramond Dial, Pharm.D, BCPS, CPP [place]  [address] [place], [place] [address]  Phone: [phone]; Fax: [phone]

## 2021-06-10 NOTE — Patient Instructions (Addendum)
Please start checking your blood pressure once a day. Please record those readings and bring then with you to your next appointment.  I will call you on Monday with your lab results  Please call me at [phone] with any questions

## 2021-06-11 ENCOUNTER — Encounter: Payer: Self-pay | Admitting: Hematology and Oncology

## 2021-06-11 LAB — LIPID PANEL
Chol/HDL Ratio: 3.9 ratio (ref 0.0–4.4)
Cholesterol, Total: 223 mg/dL — ABNORMAL HIGH (ref 100–199)
HDL: 57 mg/dL (ref >39)
LDL Chol Calc (NIH): 151 mg/dL — ABNORMAL HIGH (ref 0–99)
Triglycerides: 86 mg/dL (ref 0–149)
VLDL Cholesterol Cal: 15 mg/dL (ref 5–40)

## 2021-06-11 LAB — BASIC METABOLIC PANEL
BUN/Creatinine Ratio: 18 (ref 12–28)
BUN: 13 mg/dL (ref 8–27)
CO2: 24 mmol/L (ref 20–29)
Calcium: 9.7 mg/dL (ref 8.7–10.3)
Chloride: 106 mmol/L (ref 96–106)
Creatinine, Ser: 0.74 mg/dL (ref 0.57–1.00)
Glucose: 88 mg/dL (ref 70–99)
Potassium: 4.4 mmol/L (ref 3.5–5.2)
Sodium: 146 mmol/L — ABNORMAL HIGH (ref 134–144)
eGFR: 81 mL/min/{1.73_m2} (ref >59)

## 2021-06-15 ENCOUNTER — Telehealth: Payer: Self-pay | Admitting: Pharmacist

## 2021-06-15 ENCOUNTER — Telehealth: Payer: Self-pay | Admitting: Radiation Oncology

## 2021-06-15 NOTE — Telephone Encounter (Signed)
Called pt to review her BMP and lipid results. Na is a little high, encouraged her to stay hydrated. We did discuss adding spironolactone, but I would like to get a better sense of her home BP and HR first. BP in the office on the lower side. Will check her BP at home once a day. I suspect if BP is in the 110's at home, we will be able to add spironolactone 12.5mg  at night or increase metoprolol (HR in office was 74). F/U in the office 12/16.  She is agreeable to statin. She has atorvastatin 10mg  at home. Was taking daily previous and has muscle aches. Wants to try taking every other day. I did explain that rosuvastatin 10mg  every other day would be a better option (less likely to cause muscle issues and longer 1/2 for alternative dosing) but she wanted to use what she had first.

## 2021-06-15 NOTE — Addendum Note (Signed)
Addended by: Marcelle Overlie D on: 06/15/2021 08:35 AM   Modules accepted: Orders

## 2021-06-15 NOTE — Telephone Encounter (Signed)
I called pt to see if she had made a decision about radiation. She's in the process of taking her husband to the ED. I shared I hope he feels better and that I will check in with her in a few days.

## 2021-06-16 ENCOUNTER — Encounter: Payer: Self-pay | Admitting: *Deleted

## 2021-06-17 ENCOUNTER — Ambulatory Visit: Payer: Medicare Other

## 2021-06-17 ENCOUNTER — Other Ambulatory Visit: Payer: Medicare Other

## 2021-06-17 ENCOUNTER — Telehealth: Payer: Self-pay | Admitting: Radiation Oncology

## 2021-06-17 ENCOUNTER — Ambulatory Visit: Payer: Medicare Other | Admitting: Hematology and Oncology

## 2021-06-17 NOTE — Telephone Encounter (Signed)
I called the patient back after she left a voicemail. She would like to forgo radiation but is really struggling with the decision. I encouraged her to think it over, for now our plan is to forgo treatment, but if she changes her mind int he coming weeks, we'd be happy to revisit this discussion.

## 2021-06-18 ENCOUNTER — Encounter: Payer: Self-pay | Admitting: *Deleted

## 2021-06-18 DIAGNOSIS — C50511 Malignant neoplasm of lower-outer quadrant of right female breast: Secondary | ICD-10-CM

## 2021-06-18 DIAGNOSIS — Z170 Estrogen receptor positive status [ER+]: Secondary | ICD-10-CM

## 2021-06-23 ENCOUNTER — Telehealth: Payer: Self-pay | Admitting: *Deleted

## 2021-06-23 DIAGNOSIS — M79672 Pain in left foot: Secondary | ICD-10-CM | POA: Diagnosis not present

## 2021-06-23 DIAGNOSIS — M25572 Pain in left ankle and joints of left foot: Secondary | ICD-10-CM | POA: Diagnosis not present

## 2021-06-23 DIAGNOSIS — M76822 Posterior tibial tendinitis, left leg: Secondary | ICD-10-CM | POA: Diagnosis not present

## 2021-06-23 MED ORDER — ROSUVASTATIN CALCIUM 10 MG PO TABS: 10.0000 mg | ORAL_TABLET | ORAL | 6 refills | Status: DC

## 2021-06-23 NOTE — Telephone Encounter (Signed)
Ramond Dial, RPH-CPP  06/15/2021  8:34 AM EST Back to Top    Called pt to review her BMP and lipid results. Na is a little high, encouraged her to stay hydrated. We did discuss adding spironolactone, but I would like to get a better sense of her home BP and HR first. BP in the office on the lower side. Will check her BP at home once a day. I suspect if BP is in the 110's at home, we will be able to add spironolactone 12.5mg  at night or increase metoprolol (HR in office was 74). F/U in the office 12/16.  She is agreeable to statin. She has atorvastatin 10mg  at home. Was taking daily previous and has muscle aches. Wants to try taking every other day. I did explain that rosuvastatin 10mg  every other day would be a better option (less likely to cause muscle issues and longer 1/2 for alternative dosing) but she wanted to use what she had first.     Patient  came by office today. She states she would like to switch to atorvastatin to the medication that was discussed last week - conversation.   New prescription of rosuvastatin 10 mg  - every other day- sent to [facility]

## 2021-07-03 ENCOUNTER — Ambulatory Visit (INDEPENDENT_AMBULATORY_CARE_PROVIDER_SITE_OTHER): Payer: Medicare Other | Admitting: Pharmacist

## 2021-07-03 ENCOUNTER — Other Ambulatory Visit: Payer: Self-pay

## 2021-07-03 VITALS — BP 114/68 | HR 73

## 2021-07-03 DIAGNOSIS — E785 Hyperlipidemia, unspecified: Secondary | ICD-10-CM | POA: Diagnosis not present

## 2021-07-03 DIAGNOSIS — I5020 Unspecified systolic (congestive) heart failure: Secondary | ICD-10-CM

## 2021-07-03 MED ORDER — SPIRONOLACTONE 25 MG PO TABS: 12.5000 mg | ORAL_TABLET | Freq: Every day | ORAL | 3 refills | Status: DC

## 2021-07-03 NOTE — Patient Instructions (Addendum)
Please start taking spironolactone 12.5mg  daily Continue metoprolol succinate 25mg  daily, losartan 25mg  daily Please continue checking blood pressure and heart rate at home. Call me at [phone] with any questions

## 2021-07-03 NOTE — Progress Notes (Signed)
Patient ID: Claudia Ortiz                 DOB: 1941-05-15                      MRN: [id number]     HPI: Claudia Ortiz is a 80 y.o. female referred by Dr. Gardiner Rhyme to pharmacy clinic for HF medication management. PMH is significant for  anxiety, elevated cholesterol, colon polyp, intraductal papilloma of left breast, and tricuspid valve mass ( fibroelastoma). Most recent LVEF 35-40% on 04/07/21. She was dx with breast cancer. Underwent chemo with taxol and herceptin. Recently underwent lumpectomy with sentinel node biopsy. Recovering well. Herceptin on hold due to EF.  At last visit with pharmD, BP was on the lower side. No home BP was available. Her Na was a little high and she was advised to stay hydrated. She was asked to check BP at home. No medication changes were made  Patient presents today for follow up accompanied by her husband. She is wearing a boot on her left foot due to pain in a tendon in her foot. She is a little frustrated with all that has been going wrong with her lately. Does not like taking medications. She is afraid she will need the medication for the rest of her life. She has a little swelling in her left foot. Brings in blood pressure readings but no cuff.  Current CHF meds: metoprolol succinate 25mg  daily, losartan 25mg  daily BP goal: <130/80  Family History: The patient's family history includes Diabetes in her mother; Heart attack in her brother and mother; Heart disease in her father; Stroke in her father.  Social History: never smokes, rarely ETOH  Diet: not addressed today  Exercise: walks some, wants to increase  Home BP readings: 128/71, 122/67, 125/69, 126/65, 121/62, 128/66, 138/73, 141/66 HR 67-75  Wt Readings from Last 3 Encounters:  06/10/21 189 lb (85.7 kg)  06/09/21 189 lb 4 oz (85.8 kg)  05/27/21 187 lb 9.6 oz (85.1 kg)   BP Readings from Last 3 Encounters:  06/10/21 110/70  06/09/21 135/73  05/27/21 (!) 113/58   Pulse Readings from Last 3  Encounters:  06/10/21 74  06/09/21 78  05/27/21 82    Renal function: CrCl cannot be calculated (Patient's most recent lab result is older than the maximum 21 days allowed.).  Past Medical History:  Diagnosis Date   Anxiety    Arthritis 2019   Asthma    As Child   Cancer ([place]) 05/2020   R breast   Colon polyp    Elevated cholesterol    Intraductal papilloma of left breast     precancerous cells in R breast   Tricuspid valve mass    on 11/21/20 echo    Current Outpatient Medications on File Prior to Visit  Medication Sig Dispense Refill   Apoaequorin (PREVAGEN) 10 MG CAPS Take 10 mg by mouth daily.     aspirin EC 81 MG tablet Take 1 tablet (81 mg total) by mouth daily. Swallow whole. 90 tablet 3   b complex vitamins capsule Take 1 capsule by mouth daily.     Bioflavonoid Products (SUPER C-500 COMPLEX PO) Take 1 tablet by mouth daily.     Biotin 5000 MCG CAPS Take 5,000 mcg by mouth daily.     cholecalciferol (VITAMIN D3) 25 MCG (1000 UNIT) tablet Take 1,000 Units by mouth daily.     Evening Primrose Oil 500 MG CAPS  Take 500 mg by mouth daily.     Garlic 6606 MG CAPS Take 1,000 mg by mouth daily.     lidocaine-prilocaine (EMLA) cream Apply 1 application topically as needed (for port access).     losartan (COZAAR) 25 MG tablet Take 1 tablet (25 mg total) by mouth daily. 90 tablet 3   metoprolol succinate (TOPROL XL) 25 MG 24 hr tablet Take 1 tablet (25 mg total) by mouth daily. 90 tablet 3   Multiple Vitamin (MULTIVITAMIN) tablet Take 1 tablet by mouth daily.     PFIZER-BIONT COVID-19 VAC-TRIS SUSP injection      rosuvastatin (CRESTOR) 10 MG tablet Take 1 tablet (10 mg total) by mouth every other day. 30 tablet 6   traMADol (ULTRAM) 50 MG tablet Take 2 tablets (100 mg total) by mouth every 6 (six) hours as needed. 10 tablet 0   Vitamin A 7.5 MG (25000 UT) CAPS Take 25,000 Units by mouth daily.     No current facility-administered medications on file prior to visit.     Allergies  Allergen Reactions   Iodine Anaphylaxis   Shellfish Allergy Anaphylaxis    Shrimp   Atorvastatin     Muscle aches    Cephalosporins     unkn    Penicillins     Unkn     Assessment/Plan:  1. CHF - BP is adequate today in clinic. We discussed again the purpose of the 4 pillars of HF medications. I explained how all of them work. She wanted to know how we know if they are working. I explained that an echo would be check probably after being on medications for 3 months. She is agreeable to starting spironolactone after some conversation. Will start spironolactone 12.5mg  daily. Will check BMP today since her Na was high last time. She has been trying to drink water but its hard for her she says. Follow up in clinic in 4 weeks (next available). I requested she come in 2 weeks for just labs but patient wanted to do everything all at once. If we pursue SGLT2, patient will need to apply for patient assistance.  HLD- Patient started rosuvastatin 10mg  every other day after our phone conversation.  Thank you,  Ramond Dial, Pharm.D, BCPS, CPP [place]  [address] [place], [place] [address]  Phone: [phone]; Fax: [phone]

## 2021-07-04 ENCOUNTER — Encounter: Payer: Self-pay | Admitting: Hematology and Oncology

## 2021-07-04 LAB — BASIC METABOLIC PANEL
BUN/Creatinine Ratio: 18 (ref 12–28)
BUN: 14 mg/dL (ref 8–27)
CO2: 22 mmol/L (ref 20–29)
Calcium: 9.7 mg/dL (ref 8.7–10.3)
Chloride: 105 mmol/L (ref 96–106)
Creatinine, Ser: 0.78 mg/dL (ref 0.57–1.00)
Glucose: 82 mg/dL (ref 70–99)
Potassium: 4.4 mmol/L (ref 3.5–5.2)
Sodium: 143 mmol/L (ref 134–144)
eGFR: 76 mL/min/{1.73_m2} (ref >59)

## 2021-07-06 ENCOUNTER — Telehealth: Payer: Self-pay | Admitting: Pharmacist

## 2021-07-06 NOTE — Telephone Encounter (Signed)
Patient made aware that labs have normalized. Na WNL

## 2021-07-08 ENCOUNTER — Other Ambulatory Visit: Payer: Medicare Other

## 2021-07-08 ENCOUNTER — Ambulatory Visit: Payer: Medicare Other

## 2021-07-14 ENCOUNTER — Telehealth: Payer: Self-pay | Admitting: *Deleted

## 2021-07-14 NOTE — Telephone Encounter (Signed)
Patient called -  describes a dark bump on her back - said she can't see it, but she feels it and husband described color.  She thinks it is getting larger. NP in office informed in Dr. Geralyn Flash absence. She recommends patient be evaluated in [facility]. Patient in agreement. [facility] has appt open at 12/28 2p - patient accepted appt. Schedule message sent to schedule patient at that time.

## 2021-07-15 ENCOUNTER — Other Ambulatory Visit: Payer: Self-pay

## 2021-07-15 ENCOUNTER — Inpatient Hospital Stay: Payer: Medicare Other | Attending: Hematology and Oncology | Admitting: Physician Assistant

## 2021-07-15 VITALS — BP 126/63 | HR 81 | Temp 98.4°F | Resp 20 | Wt 193.0 lb

## 2021-07-15 DIAGNOSIS — Z833 Family history of diabetes mellitus: Secondary | ICD-10-CM | POA: Diagnosis not present

## 2021-07-15 DIAGNOSIS — L0291 Cutaneous abscess, unspecified: Secondary | ICD-10-CM | POA: Diagnosis not present

## 2021-07-15 DIAGNOSIS — Z8249 Family history of ischemic heart disease and other diseases of the circulatory system: Secondary | ICD-10-CM | POA: Diagnosis not present

## 2021-07-15 DIAGNOSIS — L02212 Cutaneous abscess of back [any part, except buttock]: Secondary | ICD-10-CM | POA: Insufficient documentation

## 2021-07-15 DIAGNOSIS — Z170 Estrogen receptor positive status [ER+]: Secondary | ICD-10-CM | POA: Insufficient documentation

## 2021-07-15 DIAGNOSIS — Z8719 Personal history of other diseases of the digestive system: Secondary | ICD-10-CM | POA: Diagnosis not present

## 2021-07-15 DIAGNOSIS — C50511 Malignant neoplasm of lower-outer quadrant of right female breast: Secondary | ICD-10-CM | POA: Diagnosis not present

## 2021-07-15 DIAGNOSIS — Z79899 Other long term (current) drug therapy: Secondary | ICD-10-CM | POA: Insufficient documentation

## 2021-07-15 DIAGNOSIS — Z888 Allergy status to other drugs, medicaments and biological substances status: Secondary | ICD-10-CM | POA: Diagnosis not present

## 2021-07-15 DIAGNOSIS — Z881 Allergy status to other antibiotic agents status: Secondary | ICD-10-CM | POA: Diagnosis not present

## 2021-07-15 DIAGNOSIS — Z823 Family history of stroke: Secondary | ICD-10-CM | POA: Diagnosis not present

## 2021-07-15 DIAGNOSIS — Z880 Allergy status to penicillin: Secondary | ICD-10-CM | POA: Insufficient documentation

## 2021-07-15 NOTE — Patient Instructions (Addendum)
-  This could be an abscess vs cellulitis (skin infection) as we discussed.   -Warm compresses at least 4 times daily  -If area increases in size, becomes more painful or you get fever and chills call the clinic to schedule recheck or go to the Emergency Department for evaluation.

## 2021-07-15 NOTE — Progress Notes (Signed)
Symptom Management Consult note [place]    Patient Care Team: Kelton Pillar, MD as PCP - General (Family Medicine) Rockwell Germany, RN as Oncology Nurse Navigator Mauro Kaufmann, RN as Oncology Nurse Navigator Rolm Bookbinder, MD as Consulting Physician (General Surgery) Nicholas Lose, MD as Consulting Physician (Hematology and Oncology) Kyung Rudd, MD as Consulting Physician (Radiation Oncology)    Name of the patient: Claudia Ortiz  [id number]  Mar 09, 1941   Date of visit: 07/15/2021    Chief complaint/ Reason for visit- lump on back  Oncology History  Malignant neoplasm of lower-outer quadrant of right breast of female, estrogen receptor positive ([place])  07/14/2020 Initial Diagnosis   Screening mammogram showed a 1.5cm right breast mass. US showed the 1.4cm mass at the 7 o'clock position, a 2.7cm mass at the 9 o'clock position, and benign right axillary lymph nodes. Biopsy showed ALH at the 9 o'clock position, and at the 7 o'clock position, IDC, grade 3, HER-2 positive (3+), ER+ 20% weak, PR- 0%, Ki67 40%.   09/03/2020 - 11/19/2020 Neo-Adjuvant Chemotherapy   Weekly Taxol (dose reduced) and Herceptin x 12   12/10/2020 -  Chemotherapy   Patient is on Treatment Plan : BREAST Trastuzumab q21d     05/12/2021 Surgery   Right lumpectomy: LCIS involving CSL, intraductal papilloma negative for IDC or DCIS Right lateral margin lumpectomy: LCIS, ADH Lymph nodes: 3 negative      Current Therapy: Herceptin maintenance 2/2 low EF  Interval history- Dawnyel Leven is an 80 yo female with oncologic history of breast cancer presenting to [facility] today with chief complaint of lump on her back x 2.5 weeks. She noticed last night the lump was getting smaller in size. It started as skin color and has gotten darker over the last week. She states it was tender to the touch until yesterday when pain resolved. No pain with movement. She rated pain 3/10 in severity when  present. She has not tried any OTC medications or warm compresses. She denies any fever, chills, night sweats, weight loss, bleeding or purulent drainage from lump, rash, injury or trauma to the area.    ROS  All other systems are reviewed and are negative for acute change except as noted in the HPI.    Allergies  Allergen Reactions   Iodine Anaphylaxis   Shellfish Allergy Anaphylaxis    Shrimp   Atorvastatin     Muscle aches    Cephalosporins     unkn    Penicillins     Unkn     Past Medical History:  Diagnosis Date   Anxiety    Arthritis 2019   Asthma    As Child   Cancer ([place]) 05/2020   R breast   Colon polyp    Elevated cholesterol    Intraductal papilloma of left breast     precancerous cells in R breast   Tricuspid valve mass    on 11/21/20 echo     Past Surgical History:  Procedure Laterality Date   AXILLARY SENTINEL NODE BIOPSY Right 05/12/2021   Procedure: AXILLARY SENTINEL NODE BIOPSY WITH MAGTRACE;  Surgeon: Rolm Bookbinder, MD;  Location: [place];  Service: General;  Laterality: Right;   BREAST LUMPECTOMY WITH RADIOACTIVE SEED AND SENTINEL LYMPH NODE BIOPSY Right 05/12/2021   Procedure: RIGHT BREAST LUMPECTOMY WITH RADIOACTIVE SEED X3;  Surgeon: Rolm Bookbinder, MD;  Location: [place];  Service: General;  Laterality: Right;   colon polyp  removed   CYST REMOVAL TRUNK     breast    EYE SURGERY Left    cataract removal   FOOT SURGERY     PORTACATH PLACEMENT Left 09/02/2020   Procedure: INSERTION PORT-A-CATH;  Surgeon: Rolm Bookbinder, MD;  Location: [place];  Service: General;  Laterality: Left;   TEE WITHOUT CARDIOVERSION N/A 01/01/2021   Procedure: TRANSESOPHAGEAL ECHOCARDIOGRAM (TEE);  Surgeon: Elouise Munroe, MD;  Location: [place];  Service: Cardiology;  Laterality: N/A;   TONSILLECTOMY     as a child    Social History   Socioeconomic History   Marital status: Married    Spouse name: Not on file   Number  of children: Not on file   Years of education: Not on file   Highest education level: Not on file  Occupational History   Not on file  Tobacco Use   Smoking status: Never   Smokeless tobacco: Never  Vaping Use   Vaping Use: Never used  Substance and Sexual Activity   Alcohol use: No   Drug use: No   Sexual activity: Yes  Other Topics Concern   Not on file  Social History Narrative   Not on file   Social Determinants of Health   Financial Resource Strain: Not on file  Food Insecurity: Not on file  Transportation Needs: Not on file  Physical Activity: Not on file  Stress: Not on file  Social Connections: Not on file  Intimate Partner Violence: Not on file    Family History  Problem Relation Age of Onset   Diabetes Mother    Heart attack Mother    Stroke Father    Heart disease Father    Heart attack Brother      Current Outpatient Medications:    Apoaequorin (PREVAGEN) 10 MG CAPS, Take 10 mg by mouth daily., Disp: , Rfl:    aspirin EC 81 MG tablet, Take 1 tablet (81 mg total) by mouth daily. Swallow whole., Disp: 90 tablet, Rfl: 3   b complex vitamins capsule, Take 1 capsule by mouth daily., Disp: , Rfl:    Bioflavonoid Products (SUPER C-500 COMPLEX PO), Take 1 tablet by mouth daily., Disp: , Rfl:    Biotin 5000 MCG CAPS, Take 5,000 mcg by mouth daily., Disp: , Rfl:    cholecalciferol (VITAMIN D3) 25 MCG (1000 UNIT) tablet, Take 1,000 Units by mouth daily., Disp: , Rfl:    Evening Primrose Oil 500 MG CAPS, Take 500 mg by mouth daily., Disp: , Rfl:    Garlic 7893 MG CAPS, Take 1,000 mg by mouth daily., Disp: , Rfl:    lidocaine-prilocaine (EMLA) cream, Apply 1 application topically as needed (for port access)., Disp: , Rfl:    losartan (COZAAR) 25 MG tablet, Take 1 tablet (25 mg total) by mouth daily., Disp: 90 tablet, Rfl: 3   metoprolol succinate (TOPROL XL) 25 MG 24 hr tablet, Take 1 tablet (25 mg total) by mouth daily., Disp: 90 tablet, Rfl: 3   Multiple Vitamin  (MULTIVITAMIN) tablet, Take 1 tablet by mouth daily., Disp: , Rfl:    PFIZER-BIONT COVID-19 VAC-TRIS SUSP injection, , Disp: , Rfl:    rosuvastatin (CRESTOR) 10 MG tablet, Take 1 tablet (10 mg total) by mouth every other day., Disp: 30 tablet, Rfl: 6   spironolactone (ALDACTONE) 25 MG tablet, Take 0.5 tablets (12.5 mg total) by mouth daily., Disp: 45 tablet, Rfl: 3   traMADol (ULTRAM) 50 MG tablet, Take 2 tablets (100 mg  total) by mouth every 6 (six) hours as needed., Disp: 10 tablet, Rfl: 0   Vitamin A 7.5 MG (25000 UT) CAPS, Take 25,000 Units by mouth daily., Disp: , Rfl:   PHYSICAL EXAM: ECOG FS:1 - Symptomatic but completely ambulatory    Vitals:   07/15/21 1403  BP: 126/63  Pulse: 81  Resp: 20  Temp: 98.4 F (36.9 C)  SpO2: 100%  Weight: 193 lb (87.5 kg)   Physical Exam Vitals and nursing note reviewed.  Constitutional:      General: She is not in acute distress.    Appearance: She is well-developed. She is not ill-appearing or toxic-appearing.  HENT:     Head: Normocephalic and atraumatic.     Right Ear: Tympanic membrane and external ear normal.     Left Ear: Tympanic membrane and external ear normal.     Nose: Nose normal.     Mouth/Throat:     Mouth: Mucous membranes are moist.     Pharynx: Oropharynx is clear.  Eyes:     General: No scleral icterus.       Right eye: No discharge.        Left eye: No discharge.     Extraocular Movements: Extraocular movements intact.     Conjunctiva/sclera: Conjunctivae normal.     Pupils: Pupils are equal, round, and reactive to light.  Neck:     Vascular: No JVD.  Cardiovascular:     Rate and Rhythm: Normal rate and regular rhythm.     Pulses: Normal pulses.          Radial pulses are 2+ on the right side and 2+ on the left side.     Heart sounds: Normal heart sounds.  Pulmonary:     Effort: Pulmonary effort is normal.     Breath sounds: Normal breath sounds.     Comments: Lungs clear to auscultation in all fields.  Symmetric chest rise. No wheezing, rales, or rhonchi. Abdominal:     General: There is no distension.     Comments: Abdomen is soft, non-distended, and non-tender in all quadrants. No rigidity, no guarding. No peritoneal signs.  Musculoskeletal:        General: Normal range of motion.     Cervical back: Normal range of motion.  Skin:    General: Skin is warm and dry.     Capillary Refill: Capillary refill takes less than 2 seconds.     Comments: 1 x 1 cm circular area on mid upper back with induration that is dark in color. Non tender to palpation. No palpable fluctuance. Not mobile. No warmth. No surrounding erythema  Neurological:     Mental Status: She is oriented to person, place, and time.     GCS: GCS eye subscore is 4. GCS verbal subscore is 5. GCS motor subscore is 6.     Comments: Fluent speech, no facial droop.  Psychiatric:        Behavior: Behavior normal.       LABORATORY DATA: I have reviewed the data as listed CBC Latest Ref Rng & Units 05/27/2021 05/06/2021 05/05/2021  WBC 4.0 - 10.5 K/uL 5.9 4.9 5.7  Hemoglobin 12.0 - 15.0 g/dL 12.1 12.2 13.0  Hematocrit 36.0 - 46.0 % 37.3 39.2 42.1  Platelets 150 - 400 K/uL 208 207 207     CMP Latest Ref Rng & Units 07/03/2021 06/10/2021 05/27/2021  Glucose 70 - 99 mg/dL 82 88 105(H)  BUN 8 - 27 mg/dL 14 13  15  Creatinine 0.57 - 1.00 mg/dL 0.78 0.74 0.94  Sodium 134 - 144 mmol/L 143 146(H) 141  Potassium 3.5 - 5.2 mmol/L 4.4 4.4 4.0  Chloride 96 - 106 mmol/L 105 106 107  CO2 20 - 29 mmol/L $RemoveB'22 24 25  'MIDEVutK$ Calcium 8.7 - 10.3 mg/dL 9.7 9.7 9.3  Total Protein 6.5 - 8.1 g/dL - - 6.7  Total Bilirubin 0.3 - 1.2 mg/dL - - 0.4  Alkaline Phos 38 - 126 U/L - - 106  AST 15 - 41 U/L - - 23  ALT 0 - 44 U/L - - 18       RADIOGRAPHIC STUDIES: I have personally reviewed the radiological images as listed and agreed with the findings in the report. No images are attached to the encounter. No results found.   ASSESSMENT & PLAN: Patient  is a 80 y.o. female with history of breast cancer currently on herceptin maintenance followed by oncologist Dr. Lindi Adie.   #)Abscess- Patient non toxic appearing. VSS. Exam shows abscess vs pimple on her back There are no infectious symptoms of findings on exam that would warrant an I&D. Discussed possible treatment options to include antibiotics vs warm compresses and watchful waiting. Patient prefers to hold off on antibiotic therapy at this time given it is getting smaller in size and pain resolved completely.  She will try warm compresses 4x daily and closely monitor to ensure it does not worsen. Patient knows if it does not improve she should be rechecked by pcp or dermatologist. Discussed ED precautions.   Visit Diagnosis: 1. Abscess      No orders of the defined types were placed in this encounter.   All questions were answered. The patient knows to call the clinic with any problems, questions or concerns. No barriers to learning was detected.  I have spent a total of 15 minutes minutes of face-to-face and non-face-to-face time, preparing to see the patient, obtaining and/or reviewing separately obtained history, performing a medically appropriate examination, counseling and educating the patient, ordering tests,  documenting clinical information in the electronic health record, and care coordination.     Thank you for allowing me to participate in the care of this patient.    Barrie Folk, PA-C Department of Hematology/Oncology [facility] Phone: [phone]  [phone]    07/15/2021 5:13 PM

## 2021-07-22 DIAGNOSIS — Z853 Personal history of malignant neoplasm of breast: Secondary | ICD-10-CM | POA: Diagnosis not present

## 2021-07-22 DIAGNOSIS — Z452 Encounter for adjustment and management of vascular access device: Secondary | ICD-10-CM | POA: Diagnosis not present

## 2021-07-27 ENCOUNTER — Telehealth: Payer: Self-pay | Admitting: *Deleted

## 2021-07-27 NOTE — Telephone Encounter (Signed)
Received VM from patient with a question about her appt. Called patient back and left her a VM for a return phone call.

## 2021-07-28 ENCOUNTER — Other Ambulatory Visit: Payer: Self-pay

## 2021-07-28 ENCOUNTER — Ambulatory Visit: Payer: Medicare Other | Admitting: Pharmacist

## 2021-07-28 DIAGNOSIS — I5020 Unspecified systolic (congestive) heart failure: Secondary | ICD-10-CM | POA: Diagnosis not present

## 2021-07-28 LAB — BASIC METABOLIC PANEL
BUN/Creatinine Ratio: 20 (ref 12–28)
BUN: 17 mg/dL (ref 8–27)
CO2: 25 mmol/L (ref 20–29)
Calcium: 9.6 mg/dL (ref 8.7–10.3)
Chloride: 105 mmol/L (ref 96–106)
Creatinine, Ser: 0.83 mg/dL (ref 0.57–1.00)
Glucose: 121 mg/dL — ABNORMAL HIGH (ref 70–99)
Potassium: 4.2 mmol/L (ref 3.5–5.2)
Sodium: 141 mmol/L (ref 134–144)
eGFR: 71 mL/min/{1.73_m2} (ref >59)

## 2021-07-28 NOTE — Progress Notes (Signed)
Patient ID: TOMARA YOUNGBERG                 DOB: [birth year]                      MRN: [id number]     HPI: Claudia Ortiz is a 81 y.o. female referred by Dr. Gardiner Rhyme to pharmacy clinic for HF medication management. PMH is significant for  anxiety, elevated cholesterol, colon polyp, intraductal papilloma of left breast, and tricuspid valve mass (fibroelastoma). Most recent LVEF 35-40% on 04/07/21. She was dx with breast cancer. Underwent chemo with taxol and herceptin. Recently underwent lumpectomy with sentinel node biopsy. Recovering well. Herceptin on hold due to EF.  At last visit with pharmD, BP was 114/68. Home readings a little higher. She was started on spironolactone 12.5mg  daily.   Patient presents today for follow up accompanied by her husband. She is wearing a brace on her left foot due to pain/burning in her foot. Also has some worsening of her right hand arthritis. She thinks it must be from one of her medications.  Does not like taking medications. She is afraid she will need the medication for the rest of her life. Denies dizziness, SOB. Swelling only in her left foot. Admits to some blurred vision - has to wear her rx glasses more. Blood pressures at home are 120's-134/60-70's. HR is 60-70's. Home cuff found to be accurate.   Home cuff reading 1: 145/75 Home cuff reading 2: 133/70 Office cuff: 132/70 Home cuff reading 3: 137/71  Current CHF meds: metoprolol succinate 25mg  daily, losartan 25mg  daily, spironolactone 12.5mg  daily BP goal: <130/80  Family History: The patient's family history includes Diabetes in her mother; Heart attack in her brother and mother; Heart disease in her father; Stroke in her father.  Social History: never smokes, rarely ETOH  Diet: not addressed today  Exercise: walks some, wants to increase  Home BP readings: 134/70, 121/62, 121/64, 131/64, 125/69, 124/69, 122/71, 126/63, 129/66, 126/66, 122/65, 124/62 HR 60-70's  Wt Readings from Last 3  Encounters:  07/15/21 193 lb (87.5 kg)  06/10/21 189 lb (85.7 kg)  06/09/21 189 lb 4 oz (85.8 kg)   BP Readings from Last 3 Encounters:  07/15/21 126/63  07/03/21 114/68  06/10/21 110/70   Pulse Readings from Last 3 Encounters:  07/15/21 81  07/03/21 73  06/10/21 74    Renal function: CrCl cannot be calculated (Patient's most recent lab result is older than the maximum 21 days allowed.).  Past Medical History:  Diagnosis Date   Anxiety    Arthritis 2019   Asthma    As Child   Cancer ([place]) 05/2020   R breast   Colon polyp    Elevated cholesterol    Intraductal papilloma of left breast     precancerous cells in R breast   Tricuspid valve mass    on 11/21/20 echo    Current Outpatient Medications on File Prior to Visit  Medication Sig Dispense Refill   Apoaequorin (PREVAGEN) 10 MG CAPS Take 10 mg by mouth daily.     aspirin EC 81 MG tablet Take 1 tablet (81 mg total) by mouth daily. Swallow whole. 90 tablet 3   b complex vitamins capsule Take 1 capsule by mouth daily.     Bioflavonoid Products (SUPER C-500 COMPLEX PO) Take 1 tablet by mouth daily.     Biotin 5000 MCG CAPS Take 5,000 mcg by mouth daily.  cholecalciferol (VITAMIN D3) 25 MCG (1000 UNIT) tablet Take 1,000 Units by mouth daily.     Evening Primrose Oil 500 MG CAPS Take 500 mg by mouth daily.     Garlic 9357 MG CAPS Take 1,000 mg by mouth daily.     lidocaine-prilocaine (EMLA) cream Apply 1 application topically as needed (for port access).     losartan (COZAAR) 25 MG tablet Take 1 tablet (25 mg total) by mouth daily. 90 tablet 3   metoprolol succinate (TOPROL XL) 25 MG 24 hr tablet Take 1 tablet (25 mg total) by mouth daily. 90 tablet 3   Multiple Vitamin (MULTIVITAMIN) tablet Take 1 tablet by mouth daily.     PFIZER-BIONT COVID-19 VAC-TRIS SUSP injection      rosuvastatin (CRESTOR) 10 MG tablet Take 1 tablet (10 mg total) by mouth every other day. 30 tablet 6   spironolactone (ALDACTONE) 25 MG tablet Take  0.5 tablets (12.5 mg total) by mouth daily. 45 tablet 3   traMADol (ULTRAM) 50 MG tablet Take 2 tablets (100 mg total) by mouth every 6 (six) hours as needed. 10 tablet 0   Vitamin A 7.5 MG (25000 UT) CAPS Take 25,000 Units by mouth daily.     No current facility-administered medications on file prior to visit.    Allergies  Allergen Reactions   Iodine Anaphylaxis   Shellfish Allergy Anaphylaxis    Shrimp   Atorvastatin     Muscle aches    Cephalosporins     unkn    Penicillins     Unkn     Assessment/Plan:  1. CHF - BP is adequate today in clinic. We discussed again the purpose of the 4 pillars of HF medications. I explained how generally the closer to target doses we can get the better the outcomes in the long run. She wanted to know how we know if they are working. I explained that an echo would be checked probably after being on medications for 3 months. I recommended we increase either losartan to 50mg  or metoprolol to 50mg  daily. Patient very hesitant. Asking again when she can get off of the medications. I did advise that data shows [place] that if we stop the medications after achieving a recovered EF that the EF tends to decline again. Advised she can discuss more with Dr. Gardiner Rhyme if she would like. Checking BMP today. Will call patient tomorrow to review labs. Will see at that time if she has considered any of the medication changes recommended. We discussed that I am here to explain what the evidence tells [place] and make recommendations. She is the patient and the decisions are always up to her.   If we were to pursue SGLT2 patient assitance would need to be applied for.  2. HLD- Patient thinks rosuvastatin is causing her foot and hand issues. We reviewed that she saw ortho the same day about her foot as the day Dr. Gardiner Rhyme called in rosuvastatin. She has had hand arthritis for years.   Thank you,  Ramond Dial, Pharm.D, BCPS, CPP [place]  [address], [place], [place] [address]  Phone: [phone]; Fax: [phone]

## 2021-07-29 ENCOUNTER — Telehealth: Payer: Self-pay | Admitting: Pharmacist

## 2021-07-29 ENCOUNTER — Ambulatory Visit: Payer: Medicare Other | Admitting: Hematology and Oncology

## 2021-07-29 ENCOUNTER — Ambulatory Visit: Payer: Medicare Other

## 2021-07-29 NOTE — Telephone Encounter (Signed)
Called patient. I reviewed her labs with her. We discussed increasing metoprolol vs losartan. Patient needed to take another phone call. I advised I would call her tomorrow.

## 2021-07-31 NOTE — Telephone Encounter (Signed)
Patient returned your call.

## 2021-07-31 NOTE — Telephone Encounter (Signed)
Patient is returning call.  °

## 2021-08-03 MED ORDER — METOPROLOL SUCCINATE ER 25 MG PO TB24: 37.5000 mg | ORAL_TABLET | Freq: Every day | ORAL | 3 refills | Status: DC

## 2021-08-03 NOTE — Telephone Encounter (Signed)
I called patient. She was about to enter a zoom meeting. She said she decided she would increase metoprolol. She wanted to call me back after her meeting so she could write down the instructions. Increase metoprolol succinate to 37.5mg  daily.  Patient did call back. We reviewed the change. Patient had been taking spironolactone 25mg  daily by mistake and she was taking her rosuvastatin daily instead on every other day by mistake. Her memory does not seem to be great and she is pretty resistant to medication changes. She attributes all her aliments to her medications- foot pain, need to use her glasses more. She has f/u with Dr. Gardiner Rhyme in Feb. She wishes to discuss medication titration and SGLT2 (would need patient assistance) with him then.

## 2021-08-11 DIAGNOSIS — M25572 Pain in left ankle and joints of left foot: Secondary | ICD-10-CM | POA: Diagnosis not present

## 2021-08-11 DIAGNOSIS — R29898 Other symptoms and signs involving the musculoskeletal system: Secondary | ICD-10-CM | POA: Diagnosis not present

## 2021-08-11 DIAGNOSIS — M76822 Posterior tibial tendinitis, left leg: Secondary | ICD-10-CM | POA: Diagnosis not present

## 2021-08-13 ENCOUNTER — Ambulatory Visit: Payer: Medicare Other | Attending: General Surgery | Admitting: Physical Therapy

## 2021-08-13 ENCOUNTER — Telehealth: Payer: Self-pay | Admitting: Radiation Oncology

## 2021-08-13 ENCOUNTER — Other Ambulatory Visit: Payer: Self-pay

## 2021-08-13 DIAGNOSIS — Z483 Aftercare following surgery for neoplasm: Secondary | ICD-10-CM

## 2021-08-13 NOTE — Therapy (Signed)
[facility] @ [address] [place], [place], [address] Phone: [phone]   Fax:  [phone]  Physical Therapy Treatment  Patient Details  Name: Claudia Ortiz MRN: [id number] Date of Birth: 04/20/1941 Referring Provider (PT): Dr. Rolm Bookbinder   Encounter Date: 08/13/2021   PT End of Session - 08/13/21 1137     Visit Number 2    Number of Visits 2    PT Start Time 3235    PT Stop Time 5732    PT Time Calculation (min) 22 min    Activity Tolerance Patient tolerated treatment well    Behavior During Therapy [facility] for tasks assessed/performed             Past Medical History:  Diagnosis Date   Anxiety    Arthritis 2019   Asthma    As Child   Cancer ([place]) 05/2020   R breast   Colon polyp    Elevated cholesterol    Intraductal papilloma of left breast     precancerous cells in R breast   Tricuspid valve mass    on 11/21/20 echo    Past Surgical History:  Procedure Laterality Date   AXILLARY SENTINEL NODE BIOPSY Right 05/12/2021   Procedure: AXILLARY SENTINEL NODE BIOPSY WITH MAGTRACE;  Surgeon: Rolm Bookbinder, MD;  Location: [facility];  Service: General;  Laterality: Right;   BREAST LUMPECTOMY WITH RADIOACTIVE SEED AND SENTINEL LYMPH NODE BIOPSY Right 05/12/2021   Procedure: RIGHT BREAST LUMPECTOMY WITH RADIOACTIVE SEED X3;  Surgeon: Rolm Bookbinder, MD;  Location: [facility];  Service: General;  Laterality: Right;   colon polyp     removed   CYST REMOVAL TRUNK     breast    EYE SURGERY Left    cataract removal   FOOT SURGERY     PORTACATH PLACEMENT Left 09/02/2020   Procedure: INSERTION PORT-A-CATH;  Surgeon: Rolm Bookbinder, MD;  Location: [facility];  Service: General;  Laterality: Left;   TEE WITHOUT CARDIOVERSION N/A 01/01/2021   Procedure: TRANSESOPHAGEAL ECHOCARDIOGRAM (TEE);  Surgeon: Elouise Munroe, MD;  Location: [facility];  Service: Cardiology;  Laterality: N/A;    TONSILLECTOMY     as a child    There were no vitals filed for this visit.   Subjective Assessment - 08/13/21 1139     Subjective Pt here for L-Dex screen 3 months post surgery                    L-DEX FLOWSHEETS - 08/13/21 1100       L-DEX LYMPHEDEMA SCREENING   Measurement Type Unilateral    L-DEX MEASUREMENT EXTREMITY Upper Extremity    POSITION  Standing    DOMINANT SIDE Right    At Risk Side Right    BASELINE SCORE (UNILATERAL) 1.8    L-DEX SCORE (UNILATERAL) -2.8    VALUE CHANGE (UNILAT) -4.6                                     PT Long Term Goals - 06/01/21 1049       PT LONG TERM GOAL #1   Title Patient will demonstrate she has regained full shoulder ROM and function post operatively compared to baselines.    Time 4    Period Weeks    Status On-going    Target Date 06/29/21      PT  LONG TERM GOAL #2   Title Patient will increase right shoulder flexion and abduction to >/= 150 degrees for increase ease reaching and obtaining radiation positioning.    Time 4    Period Weeks    Status New    Target Date 06/29/21      PT LONG TERM GOAL #3   Title Patient will improve her DASH score to be back to zero as this was her baseline to improve overall UE function.    Time 4    Period Weeks    Status New    Target Date 06/29/21      PT LONG TERM GOAL #4   Title Patient will verbalize good understanding of lymphedema risk reduction practices.    Time 4    Period Weeks    Status New    Target Date 06/29/21                   Plan - 08/13/21 1140     Clinical Impression Statement Patient measures within recommended range on SOZO machine based on her baseline score.    PT Next Visit Plan Continue 3 months L-Dex screens             Patient will benefit from skilled therapeutic intervention in order to improve the following deficits and impairments:     Visit Diagnosis: Aftercare following surgery for  neoplasm     Problem List Patient Active Problem List   Diagnosis Date Noted   HFrEF (heart failure with reduced ejection fraction) ([place]) 07/03/2021   Tricuspid valve mass    Port-A-Cath in place 09/10/2020   Malignant neoplasm of lower-outer quadrant of right breast of female, estrogen receptor positive ([place]) 07/14/2020   Dyspnea 02/18/2014   Fatigue 02/18/2014   Abnormal ECG 02/18/2014   History of left breast cancer 02/18/2014   Hyperlipidemia 02/18/2014   Annia Friendly, PT 08/13/21 11:41 AM   [facility] @ [address] [place], [place], [address] Phone: [phone]   Fax:  [phone]  Name: SHAUNITA SENEY MRN: [id number] Date of Birth: 1941/01/05

## 2021-08-13 NOTE — Telephone Encounter (Signed)
I returned the patient's call but had to leave a voicemail asking her to call us back.

## 2021-08-21 ENCOUNTER — Telehealth: Payer: Self-pay | Admitting: *Deleted

## 2021-08-27 ENCOUNTER — Inpatient Hospital Stay: Payer: Medicare Other | Attending: Hematology and Oncology | Admitting: Adult Health

## 2021-08-27 ENCOUNTER — Other Ambulatory Visit: Payer: Self-pay

## 2021-08-27 ENCOUNTER — Encounter: Payer: Self-pay | Admitting: Adult Health

## 2021-08-27 VITALS — BP 139/53 | HR 85 | Temp 98.0°F | Resp 18 | Ht 65.0 in | Wt 193.5 lb

## 2021-08-27 DIAGNOSIS — Z8719 Personal history of other diseases of the digestive system: Secondary | ICD-10-CM | POA: Insufficient documentation

## 2021-08-27 DIAGNOSIS — Z881 Allergy status to other antibiotic agents status: Secondary | ICD-10-CM | POA: Diagnosis not present

## 2021-08-27 DIAGNOSIS — M76822 Posterior tibial tendinitis, left leg: Secondary | ICD-10-CM | POA: Diagnosis not present

## 2021-08-27 DIAGNOSIS — Z13820 Encounter for screening for osteoporosis: Secondary | ICD-10-CM | POA: Insufficient documentation

## 2021-08-27 DIAGNOSIS — Z823 Family history of stroke: Secondary | ICD-10-CM | POA: Insufficient documentation

## 2021-08-27 DIAGNOSIS — C50511 Malignant neoplasm of lower-outer quadrant of right female breast: Secondary | ICD-10-CM | POA: Insufficient documentation

## 2021-08-27 DIAGNOSIS — Z79899 Other long term (current) drug therapy: Secondary | ICD-10-CM | POA: Insufficient documentation

## 2021-08-27 DIAGNOSIS — M25572 Pain in left ankle and joints of left foot: Secondary | ICD-10-CM | POA: Diagnosis not present

## 2021-08-27 DIAGNOSIS — E2839 Other primary ovarian failure: Secondary | ICD-10-CM | POA: Diagnosis not present

## 2021-08-27 DIAGNOSIS — Z8249 Family history of ischemic heart disease and other diseases of the circulatory system: Secondary | ICD-10-CM | POA: Diagnosis not present

## 2021-08-27 DIAGNOSIS — R5383 Other fatigue: Secondary | ICD-10-CM | POA: Diagnosis not present

## 2021-08-27 DIAGNOSIS — R29898 Other symptoms and signs involving the musculoskeletal system: Secondary | ICD-10-CM | POA: Diagnosis not present

## 2021-08-27 DIAGNOSIS — Z880 Allergy status to penicillin: Secondary | ICD-10-CM | POA: Diagnosis not present

## 2021-08-27 DIAGNOSIS — Z888 Allergy status to other drugs, medicaments and biological substances status: Secondary | ICD-10-CM | POA: Diagnosis not present

## 2021-08-27 DIAGNOSIS — Z170 Estrogen receptor positive status [ER+]: Secondary | ICD-10-CM | POA: Insufficient documentation

## 2021-08-27 DIAGNOSIS — Z833 Family history of diabetes mellitus: Secondary | ICD-10-CM | POA: Insufficient documentation

## 2021-08-27 MED ORDER — ANASTROZOLE 1 MG PO TABS
1.0000 mg | ORAL_TABLET | Freq: Every day | ORAL | 3 refills | Status: DC
Start: 2021-08-27 — End: 2022-11-01

## 2021-08-27 NOTE — Progress Notes (Signed)
SURVIVORSHIP VISIT:    BRIEF ONCOLOGIC HISTORY:  Oncology History  Malignant neoplasm of lower-outer quadrant of right breast of female, estrogen receptor positive ([place])  07/14/2020 Initial Diagnosis   Screening mammogram showed a 1.5cm right breast mass. US showed the 1.4cm mass at the 7 o'clock position, a 2.7cm mass at the 9 o'clock position, and benign right axillary lymph nodes. Biopsy showed ALH at the 9 o'clock position, and at the 7 o'clock position, IDC, grade 3, HER-2 positive (3+), ER+ 20% weak, PR- 0%, Ki67 40%.   09/03/2020 - 11/19/2020 Neo-Adjuvant Chemotherapy   Weekly Taxol (dose reduced) and Herceptin x 12   12/10/2020 - 03/25/2021 Chemotherapy   Patient is on Treatment Plan : BREAST Trastuzumab q21d      05/12/2021 Surgery   Right lumpectomy: LCIS involving CSL, intraductal papilloma negative for IDC or DCIS Right lateral margin lumpectomy: LCIS, ADH Lymph nodes: 3 negative      INTERVAL HISTORY:  Claudia Ortiz to review her survivorship care plan detailing her treatment course for breast cancer, as well as monitoring long-term side effects of that treatment, education regarding health maintenance, screening, and overall wellness and health promotion.     Overall, Claudia Ortiz reports feeling quite well today.  She had some intermittent pain in her right breast.  Otherwise she says that she is doing quite well.  She has undergone lumpectomy and adjuvant chemotherapy.  She is due to talk about antiestrogen therapy today.  REVIEW OF SYSTEMS:  Review of Systems  Constitutional:  Positive for fatigue (Mild and improving). Negative for appetite change, chills, fever and unexpected weight change.  HENT:   Negative for hearing loss, lump/mass and trouble swallowing.   Eyes:  Negative for eye problems and icterus.  Respiratory:  Negative for chest tightness, cough and shortness of breath.   Cardiovascular:  Negative for chest pain, leg swelling and palpitations.   Gastrointestinal:  Negative for abdominal distention, abdominal pain, constipation, diarrhea, nausea and vomiting.  Endocrine: Negative for hot flashes.  Genitourinary:  Negative for difficulty urinating.   Musculoskeletal:  Negative for arthralgias.  Skin:  Negative for itching and rash.  Neurological:  Negative for dizziness, extremity weakness, headaches and numbness.  Hematological:  Negative for adenopathy. Does not bruise/bleed easily.  Psychiatric/Behavioral:  Negative for depression. The patient is not nervous/anxious.   Breast: Denies any new nodularity, masses, tenderness, nipple changes, or nipple discharge.      ONCOLOGY TREATMENT TEAM:  1. Surgeon:  Dr. Donne Hazel at [facility] 2. Medical Oncologist: Dr. Lindi Adie  3. Radiation Oncologist: Dr. Lisbeth Renshaw    PAST MEDICAL/SURGICAL HISTORY:  Past Medical History:  Diagnosis Date   Anxiety    Arthritis 2019   Asthma    As Child   Cancer ([place]) 05/2020   R breast   Colon polyp    Elevated cholesterol    Intraductal papilloma of left breast     precancerous cells in R breast   Tricuspid valve mass    on 11/21/20 echo   Past Surgical History:  Procedure Laterality Date   AXILLARY SENTINEL NODE BIOPSY Right 05/12/2021   Procedure: AXILLARY SENTINEL NODE BIOPSY WITH MAGTRACE;  Surgeon: Rolm Bookbinder, MD;  Location: [facility];  Service: General;  Laterality: Right;   BREAST LUMPECTOMY WITH RADIOACTIVE SEED AND SENTINEL LYMPH NODE BIOPSY Right 05/12/2021   Procedure: RIGHT BREAST LUMPECTOMY WITH RADIOACTIVE SEED X3;  Surgeon: Rolm Bookbinder, MD;  Location: [facility];  Service: General;  Laterality: Right;   colon polyp  removed   CYST REMOVAL TRUNK     breast    EYE SURGERY Left    cataract removal   FOOT SURGERY     PORTACATH PLACEMENT Left 09/02/2020   Procedure: INSERTION PORT-A-CATH;  Surgeon: Rolm Bookbinder, MD;  Location: [facility];  Service: General;  Laterality: Left;   TEE  WITHOUT CARDIOVERSION N/A 01/01/2021   Procedure: TRANSESOPHAGEAL ECHOCARDIOGRAM (TEE);  Surgeon: Elouise Munroe, MD;  Location: [facility];  Service: Cardiology;  Laterality: N/A;   TONSILLECTOMY     as a child     ALLERGIES:  Allergies  Allergen Reactions   Iodine Anaphylaxis   Shellfish Allergy Anaphylaxis    Shrimp   Atorvastatin     Muscle aches    Cephalosporins     unkn    Penicillins     Unkn     CURRENT MEDICATIONS:  Outpatient Encounter Medications as of 08/27/2021  Medication Sig   Apoaequorin (PREVAGEN) 10 MG CAPS Take 10 mg by mouth daily.   aspirin EC 81 MG tablet Take 1 tablet (81 mg total) by mouth daily. Swallow whole.   b complex vitamins capsule Take 1 capsule by mouth daily.   Bioflavonoid Products (SUPER C-500 COMPLEX PO) Take 1 tablet by mouth daily.   Biotin 5000 MCG CAPS Take 5,000 mcg by mouth daily.   cholecalciferol (VITAMIN D3) 25 MCG (1000 UNIT) tablet Take 1,000 Units by mouth daily.   Evening Primrose Oil 500 MG CAPS Take 500 mg by mouth daily.   Garlic 7829 MG CAPS Take 1,000 mg by mouth daily.   lidocaine-prilocaine (EMLA) cream Apply 1 application topically as needed (for port access).   losartan (COZAAR) 25 MG tablet Take 1 tablet (25 mg total) by mouth daily.   metoprolol succinate (TOPROL XL) 25 MG 24 hr tablet Take 1.5 tablets (37.5 mg total) by mouth daily.   Multiple Vitamin (MULTIVITAMIN) tablet Take 1 tablet by mouth daily.   PFIZER-BIONT COVID-19 VAC-TRIS SUSP injection    rosuvastatin (CRESTOR) 10 MG tablet Take 1 tablet (10 mg total) by mouth every other day.   spironolactone (ALDACTONE) 25 MG tablet Take 0.5 tablets (12.5 mg total) by mouth daily.   traMADol (ULTRAM) 50 MG tablet Take 2 tablets (100 mg total) by mouth every 6 (six) hours as needed.   Vitamin A 7.5 MG (25000 UT) CAPS Take 25,000 Units by mouth daily.   No facility-administered encounter medications on file as of 08/27/2021.     ONCOLOGIC FAMILY HISTORY:   Family History  Problem Relation Age of Onset   Diabetes Mother    Heart attack Mother    Stroke Father    Heart disease Father    Heart attack Brother      GENETIC COUNSELING/TESTING: See above  SOCIAL HISTORY:  Social History   Socioeconomic History   Marital status: Married    Spouse name: Not on file   Number of children: Not on file   Years of education: Not on file   Highest education level: Not on file  Occupational History   Not on file  Tobacco Use   Smoking status: Never   Smokeless tobacco: Never  Vaping Use   Vaping Use: Never used  Substance and Sexual Activity   Alcohol use: No   Drug use: No   Sexual activity: Yes  Other Topics Concern   Not on file  Social History Narrative   Not on file   Social Determinants of Health  Financial Resource Strain: Not on file  Food Insecurity: Not on file  Transportation Needs: Not on file  Physical Activity: Not on file  Stress: Not on file  Social Connections: Not on file  Intimate Partner Violence: Not on file     OBSERVATIONS/OBJECTIVE:  BP (!) 139/53 (BP Location: Left Arm, Patient Position: Sitting)    Pulse 85    Temp 98 F (36.7 C) (Tympanic)    Resp 18    Ht $R'5\' 5"'lD$  (1.651 m)    Wt 193 lb 8 oz (87.8 kg)    SpO2 99%    BMI 32.20 kg/m  GENERAL: Patient is a well appearing female in no acute distress HEENT:  Sclerae anicteric.  Oropharynx clear and moist. No ulcerations or evidence of oropharyngeal candidiasis. Neck is supple.  NODES:  No cervical, supraclavicular, or axillary lymphadenopathy palpated.  BREAST EXAM:  right breast s/p lumpectomy slight thickness at lumpectomy site, left breast benign LUNGS:  Clear to auscultation bilaterally.  No wheezes or rhonchi. HEART:  Regular rate and rhythm. No murmur appreciated. ABDOMEN:  Soft, nontender.  Positive, normoactive bowel sounds. No organomegaly palpated. MSK:  No focal spinal tenderness to palpation. Full range of motion bilaterally in the upper  extremities. EXTREMITIES:  No peripheral edema.   SKIN:  Clear with no obvious rashes or skin changes. No nail dyscrasia. NEURO:  Nonfocal. Well oriented.  Appropriate affect.   LABORATORY DATA:  None for this visit.  DIAGNOSTIC IMAGING:  None for this visit.      ASSESSMENT AND PLAN:  Ms.. Keagle is a pleasant 81 y.o. female with Stage IA right breast invasive ductal carcinoma, ER+/PR+/HER2-, diagnosed in 06/2020, treated with neoadjuvant chemotherapy and lumpectomy, we will discuss today starting antiestrogen therapy with Anastrozole.  She presents to the Survivorship Clinic for our initial meeting and routine follow-up post-completion of treatment for breast cancer.    1. Stage IA right breast cancer:  Ms. Claudia Ortiz is continuing to recover from definitive treatment for breast cancer. She will follow-up with  me in 3 months virtually and in 6 months with her medical oncologist, Dr. Lindi Adie.  We discussed anti estrogen therapy in detail today.  She and I reviewed the risks and benefits which I educated her on and were also included in her SCP.  I have sent in Anastrozole to [facility] for her to start.  Her mammogram is due; orders placed today. Today, a comprehensive survivorship care plan and treatment summary was reviewed with the patient today detailing her breast cancer diagnosis, treatment course, potential late/long-term effects of treatment, appropriate follow-up care with recommendations for the future, and patient education resources.  A copy of this summary, along with a letter will be sent to the patients primary care provider via mail/fax/In Basket message after todays visit.    2. Bone health:  Given Ms. Hughson's age/history of breast cancer and her current treatment regimen including anti-estrogen therapy with Anastrozole, she is at risk for bone demineralization.  Her last DEXA scan was 10/2019, we will repeat in 10/2021.  She was given education on specific activities to  promote bone health.  3. Cancer screening:  Due to Ms. Lovings's history and her age, she should receive screening for skin cancers.  The information and recommendations are listed on the patient's comprehensive care plan/treatment summary and were reviewed in detail with the patient.    4. Health maintenance and wellness promotion: Ms. Morgenthaler was encouraged to consume 5-7 servings of fruits and vegetables per  day. We reviewed the "Nutrition Rainbow" handout.  She was also encouraged to engage in moderate to vigorous exercise for 30 minutes per day most days of the week. We discussed the [employer] fitness program, which is designed for cancer survivors to help them become more physically fit after cancer treatments.  She was instructed to limit her alcohol consumption and continue to abstain from tobacco use.     5. Support services/counseling: It is not uncommon for this period of the patient's cancer care trajectory to be one of many emotions and stressors.  She was given information regarding our available services and encouraged to contact me with any questions or for help enrolling in any of our support group/programs.    Follow up instructions:    -Return to cancer center in 6 months for f/u with Dr. Lindi Adie, virtual follow up with me in 3 months  -Mammogram due  -Bone density due in 10/2021 -Follow up with surgery in 1 year -She is welcome to return back to the Survivorship Clinic at any time; no additional follow-up needed at this time.  -Consider referral back to survivorship as a long-term survivor for continued surveillance  The patient was provided an opportunity to ask questions and all were answered. The patient agreed with the plan and demonstrated an understanding of the instructions.   Total encounter time: 55 minutes in face to face visit time, chart review, lab review, care coordination and documentation of the encounter.    Wilber Bihari, NP 08/27/21 10:41  AM Medical Oncology and Hematology [facility] [place], [place] [address] Tel. [phone]    Fax. [phone]  *Total Encounter Time as defined by the Centers for Medicare and Medicaid Services includes, in addition to the face-to-face time of a patient visit (documented in the note above) non-face-to-face time: obtaining and reviewing outside history, ordering and reviewing medications, tests or procedures, care coordination (communications with other health care professionals or caregivers) and documentation in the medical record.

## 2021-08-30 NOTE — Progress Notes (Signed)
Cardiology Office Note:    Date:  09/01/2021   ID:  Claudia Ortiz, DOB 09-19-1940, MRN [id number]  PCP:  Kelton Pillar, MD   [facility] Providers Cardiologist:  Donato Heinz, MD     Referring MD: Kelton Pillar, MD   CC: Tricuspid valve mass  History of Present Illness:    Claudia Ortiz is a 81 y.o. female with a hx of anxiety, elevated cholesterol, colon polyp, intraductal papilloma of left breast, and tricuspid valve mass who presents for follow-up.  She was refered by Dr. Laurann Montana for pre-op clearance for lumpectomy, initially seen on 12/23/2020.  Echocardiogram on 11/21/2020 showed EF 50 to 55%, normal RV function, echodensity on the tricuspid valve measuring 1 cm, mild TR. Transesophageal echocardiogram on 01/01/2021 showed 1.1 x 0.8 cm mass adherent to the atrial aspect of the posterior tricuspid valve leaflet, likely representing thrombus.  Also with small mobile echodensity on the pulmonary artery aspect of the pulmonary valve, likely representing thrombus.  EF 50%, normal RV function, small pericardial effusion, small PFO.  Lower extremity duplex on 01/02/2021 was negative.  Echocardiogram 04/07/2021 showed EF 35 to 40%, global hypokinesis, and grade 1 diastolic dysfunction, normal RV function, mild MR, unchanged tricuspid valve mass.  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  Since last clinic visit, she reports that she has been doing okay.  Denies any chest pain, dyspnea, lower extremity edema, or palpitations.  Reports lightheadedness but denies any syncope.  Having pain in legs thought to be contracture, started PT.   Past Medical History:  Diagnosis Date   Anxiety    Arthritis 2019   Asthma    As Child   Cancer ([place]) 05/2020   R breast   Colon polyp    Elevated cholesterol    Intraductal  papilloma of left breast     precancerous cells in R breast   Tricuspid valve mass    on 11/21/20 echo    Past Surgical History:  Procedure Laterality Date   AXILLARY SENTINEL NODE BIOPSY Right 05/12/2021   Procedure: AXILLARY SENTINEL NODE BIOPSY WITH MAGTRACE;  Surgeon: Rolm Bookbinder, MD;  Location: [facility];  Service: General;  Laterality: Right;   BREAST LUMPECTOMY WITH RADIOACTIVE SEED AND SENTINEL LYMPH NODE BIOPSY Right 05/12/2021   Procedure: RIGHT BREAST LUMPECTOMY WITH RADIOACTIVE SEED X3;  Surgeon: Rolm Bookbinder, MD;  Location: [facility];  Service: General;  Laterality: Right;   colon polyp     removed   CYST REMOVAL TRUNK     breast    EYE SURGERY Left    cataract removal   FOOT SURGERY     PORTACATH PLACEMENT Left 09/02/2020   Procedure: INSERTION PORT-A-CATH;  Surgeon: Rolm Bookbinder, MD;  Location: [facility];  Service: General;  Laterality: Left;   TEE WITHOUT CARDIOVERSION N/A 01/01/2021   Procedure: TRANSESOPHAGEAL ECHOCARDIOGRAM (TEE);  Surgeon: Elouise Munroe, MD;  Location: [facility];  Service: Cardiology;  Laterality: N/A;   TONSILLECTOMY     as a child    Current Medications: Current Meds  Medication Sig   anastrozole (ARIMIDEX) 1 MG tablet Take 1 tablet (1 mg total) by mouth daily.   Apoaequorin (PREVAGEN) 10 MG CAPS Take 10 mg by mouth daily.   aspirin EC 81 MG tablet Take 1 tablet (81 mg total) by mouth daily. Swallow whole.  b complex vitamins capsule Take 1 capsule by mouth daily.   Bioflavonoid Products (SUPER C-500 COMPLEX PO) Take 1 tablet by mouth daily.   Biotin 5000 MCG CAPS Take 5,000 mcg by mouth daily.   cholecalciferol (VITAMIN D3) 25 MCG (1000 UNIT) tablet Take 1,000 Units by mouth daily.   dapagliflozin propanediol (FARXIGA) 10 MG TABS tablet Take 1 tablet (10 mg total) by mouth daily before breakfast.   Evening Primrose Oil 500 MG CAPS Take 500 mg by mouth daily.   Garlic 0626 MG CAPS Take 1,000 mg by mouth  daily.   losartan (COZAAR) 25 MG tablet Take 1 tablet (25 mg total) by mouth daily.   metoprolol succinate (TOPROL XL) 25 MG 24 hr tablet Take 1.5 tablets (37.5 mg total) by mouth daily.   Multiple Vitamin (MULTIVITAMIN) tablet Take 1 tablet by mouth daily.   PFIZER-BIONT COVID-19 VAC-TRIS SUSP injection    rosuvastatin (CRESTOR) 10 MG tablet Take 1 tablet (10 mg total) by mouth every other day.   spironolactone (ALDACTONE) 25 MG tablet Take 0.5 tablets (12.5 mg total) by mouth daily.   Vitamin A 7.5 MG (25000 UT) CAPS Take 25,000 Units by mouth daily.     Allergies:   Iodine, Shellfish allergy, Atorvastatin, Cephalosporins, and Penicillins   Social History   Socioeconomic History   Marital status: Married    Spouse name: Not on file   Number of children: Not on file   Years of education: Not on file   Highest education level: Not on file  Occupational History   Not on file  Tobacco Use   Smoking status: Never   Smokeless tobacco: Never  Vaping Use   Vaping Use: Never used  Substance and Sexual Activity   Alcohol use: No   Drug use: No   Sexual activity: Yes  Other Topics Concern   Not on file  Social History Narrative   Not on file   Social Determinants of Health   Financial Resource Strain: Not on file  Food Insecurity: Not on file  Transportation Needs: Not on file  Physical Activity: Not on file  Stress: Not on file  Social Connections: Not on file     Family History: The patient's family history includes Diabetes in her mother; Heart attack in her brother and mother; Heart disease in her father; Stroke in her father.  ROS:   Please see the history of present illness.     All other systems reviewed and are negative.  EKGs/Labs/Other Studies Reviewed:    The following studies were reviewed today:  [place] LE Venous 01/02/2021: RIGHT:  - No evidence of deep vein thrombosis in the lower extremity. No indirect  evidence of obstruction proximal to the inguinal  ligament.  - No cystic structure found in the popliteal fossa.     LEFT:  - No evidence of deep vein thrombosis in the lower extremity. No indirect  evidence of obstruction proximal to the inguinal ligament.  - No cystic structure found in the popliteal fossa.  - Small amount of superficial edema seen at left medial ankle.   TEE 01/01/2021:  1. There is a 1.1 x 0.8 cm mass that appears adherent to the atrial  aspect of the posterior tricuspid valve leaflet. In the setting of  malignancy, this mass likely represents thrombus. . The tricuspid valve is  abnormal.   2. There is a small mobile echodensity on the PA aspect of the pulmonary  valve. This mass likely represents thrombus given  findings on tricuspid  valve. . The pulmonic valve was abnormal.   3. Left ventricular ejection fraction, by estimation, is 50%. The left  ventricle has low normal function.   4. Right ventricular systolic function is normal. The right ventricular  size is normal.   5. No left atrial/left atrial appendage thrombus was detected. The LAA  emptying velocity was 56 cm/s.   6. A small pericardial effusion is present. The pericardial effusion is  in the transverse sinus. There is a small amount of epicardial fat noted  in the transverse sinus freely mobile in pericardial fluid.   7. The mitral valve is normal in structure. Trivial mitral valve  regurgitation.   8. The aortic valve is grossly normal. There is mild calcification of the  aortic valve. Aortic valve regurgitation is not visualized. No aortic  stenosis is present.   9. Evidence of atrial level shunting detected by color flow Doppler.  There is a small patent foramen ovale with predominantly left to right  shunting across the atrial septum. Possible small fenestration in atrial  septum.   Comparison(s): Tricuspid valve mass was present on the study from 11/21/20.  On review of TTE from 07/25/20, this mass may have been present at that time  as well,  given similar location of echo density, however more subtle and  image quality did not optimize.   Conclusion(s)/Recommendation(s): Critical findings reported to Dr.  Gardiner Rhyme and acknowledged at 01/01/21 3:53 pm.   Echo 11/21/2020: 1. Basal inferior hypokinesis. . Left ventricular ejection fraction, by  estimation, is 50 to 55%. The left ventricle has low normal function.   2. Right ventricular systolic function is normal. The right ventricular  size is normal. There is normal pulmonary artery systolic pressure.   3. Trivial mitral valve regurgitation.   4. 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 It measures 11 x 10 mm. Review of echo from Jan 2022 (the only previous echo) the echodensity appears to be present there as well though windows are not as good. Would consider TEE to further evaluate.   5. The aortic valve is normal in structure. Aortic valve regurgitation is  not visualized.   6. The inferior vena cava is normal in size with greater than 50%  respiratory variability, suggesting right atrial pressure of 3 mmHg.  Echo 07/25/2020: 1. Left ventricular ejection fraction, by estimation, is 55 to 60%. The  left ventricle has normal function. The left ventricle has no regional  wall motion abnormalities. Left ventricular diastolic parameters are  consistent with Grade I diastolic  dysfunction (impaired relaxation).   2. Right ventricular systolic function is normal. The right ventricular  size is normal.   3. The mitral valve is normal in structure. Trivial mitral valve  regurgitation. No evidence of mitral stenosis.   4. The aortic valve is tricuspid. Aortic valve regurgitation is not  visualized. No aortic stenosis is present.   Conclusion(s)/Recommendation(s): Normal biventricular function without evidence of hemodynamically significant valvular heart disease.  EKG:   04/13/21: NSR, rate 82, poor R wave progression, Q waves III,  aVF 01/05/2021: EKG is not ordered today. 12/23/2020: NSR, rate 89, Low voltage, Nonspecific T wave flattening  Recent Labs: 05/27/2021: ALT 18; Hemoglobin 12.1; Platelet Count 208 07/28/2021: BUN 17; Creatinine, Ser 0.83; Potassium 4.2; Sodium 141  Recent Lipid Panel    Component Value Date/Time   CHOL 223 (H) 06/10/2021 1420   TRIG 86 06/10/2021 1420  HDL 57 06/10/2021 1420   CHOLHDL 3.9 06/10/2021 1420   LDLCALC 151 (H) 06/10/2021 1420      Physical Exam:    VS:  BP 110/64    Pulse 77    Ht 5' 5.5" (1.664 m)    Wt 192 lb 9.6 oz (87.4 kg)    SpO2 95%    BMI 31.56 kg/m     Wt Readings from Last 3 Encounters:  09/01/21 192 lb 9.6 oz (87.4 kg)  08/27/21 193 lb 8 oz (87.8 kg)  07/15/21 193 lb (87.5 kg)     GEN: Well nourished, well developed in no acute distress HEENT: Normal NECK: No JVD; No carotid bruits LYMPHATICS: No lymphadenopathy CARDIAC: RRR, no murmurs, rubs, gallops RESPIRATORY:  Clear to auscultation without rales, wheezing or rhonchi  ABDOMEN: Soft, non-tender, non-distended MUSCULOSKELETAL:  Trace LE edema; No deformity  SKIN: Warm and dry NEUROLOGIC:  Alert and oriented x 3 PSYCHIATRIC:  Normal affect   ASSESSMENT:    1. Chronic combined systolic and diastolic heart failure ([place])   2. Papillary fibroelastoma   3. Medication management   4. Hyperlipidemia, unspecified hyperlipidemia type        PLAN:    Tricuspid valve fibroelastoma: Echocardiogram on 11/21/2020 showed echodensity on the tricuspid valve measuring 1 cm, mild TR.  Transesophageal echocardiogram on 01/01/2021 showed 1.1 x 0.8 cm mass adherent to the atrial aspect of the posterior tricuspid valve leaflet, likely representing thrombus.  Also with small mobile echodensity on the pulmonary artery aspect of the pulmonary valve, likely representing thrombus.  EF 50%, normal RV function, small pericardial effusion, small PFO.  Lower extremity duplex on 01/02/2021 was negative for DVT  Echocardiogram  04/07/2021 showed EF 35 to 40%, global hypokinesis, and grade 1 diastolic dysfunction, normal RV function, mild MR, unchanged tricuspid valve mass.  Stress MRI on 04/18/2021 showed mobile mass attached to septal leaflet of tricuspid valve consistent with papillary fibroelatoma -Blood cultures negative when mass initially seen.  Initially suspected thrombus, completed 3 months of anticoagulation with repeat echo showing unchanged mass.  Cardiac MRI confirms fibroelastoma -Given right-sided fibroelastoma and considering age and no history of embolic events, do not recommend surgey -Discussed continuing Eliquis, which may have some benefit given fibroelastoma but discussed that there is little data on this.  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  Her preference is to stop the Eliquis and I think this is reasonable.  Switched to aspirin 81 mg daily  Chronic combined systolic and diastolic heart failure: Echocardiogram 04/07/2021 showed EF 35 to 40%, global hypokinesis, and grade 1 diastolic dysfunction, normal RV function, mild MR, unchanged tricuspid valve mass.  Stress MRI on 04/18/2021 showed no evidence of ischemia on stress perfusion, no LGE, LVEF 43%, RVEF 53%.  Echo 05/25/2021 showed EF 40%, normal RV function, mild to moderate left atrial dilatation, mild right atrial dilatation, 1 cm x 0.9 cm tricuspid valve mass. -Continue Toprol-XL 25 mg daily -Continue losartan 25 mg daily -Continue spironolactone 12.5 mg daily -Add Farxiga 10 mg daily.  Check BMET in 1 week  Breast cancer: Underwent lumpectomy on 05/12/2021.    Hyperlipidemia: LDL 151 06/10/21, started rosuvastatin 10 mg daily.  She reports that she is having some joint aches.  Will trial off rosuvastatin for 2 weeks to see if improved.  She previously had issues with myalgias on atorvastatin.  Will check calcium score to guide  how  aggressive to be in lowering cholesterol.  RTC in 3 months   Medication Adjustments/Labs and Tests Ordered: Current medicines are reviewed at length with the patient today.  Concerns regarding medicines are outlined above.  Orders Placed This Encounter  Procedures   CT CARDIAC SCORING (SELF PAY ONLY)   Basic Metabolic Panel (BMET)      Meds ordered this encounter  Medications   dapagliflozin propanediol (FARXIGA) 10 MG TABS tablet    Sig: Take 1 tablet (10 mg total) by mouth daily before breakfast.    Dispense:  90 tablet    Refill:  3      Patient Instructions  Medication Instructions:  Your physician has recommended you make the following change in your medication:  START: Farxiga 10 mg once daily HOLD: Crestor for 1 week and let us know if your joint pain gets better.  *If you need a refill on your cardiac medications before your next appointment, please call your pharmacy*   Lab Work: Your physician recommends that you return for lab work in:  1 week: BMET If you have labs (blood work) drawn today and your tests are completely normal, you will receive your results only by: [place] (if you have [place]) OR A paper copy in the mail If you have any lab test that is abnormal or we need to change your treatment, we will call you to review the results.   Testing/Procedures: Dr. Gardiner Rhyme has ordered a CT coronary calcium score.   Test locations:  [facility] ([address] [place], [place] [address]) [facility] ([address] [place], [place] [address])   This is $99 out of pocket.   Coronary CalciumScan A coronary calcium scan is an imaging test used to look for deposits of calcium and other fatty materials (plaques) in the inner lining of the blood vessels of the heart (coronary arteries). These deposits of calcium and plaques can partly clog and narrow the coronary arteries without producing any symptoms or warning signs. This puts a  person at risk for a heart attack. This test can detect these deposits before symptoms develop. Tell a health care provider about: Any allergies you have. All medicines you are taking, including vitamins, herbs, eye drops, creams, and over-the-counter medicines. Any problems you or family members have had with anesthetic medicines. Any blood disorders you have. Any surgeries you have had. Any medical conditions you have. Whether you are pregnant or may be pregnant. What are the risks? Generally, this is a safe procedure. However, problems may occur, including: Harm to a pregnant woman and her unborn baby. This test involves the use of radiation. Radiation exposure can be dangerous to a pregnant woman and her unborn baby. If you are pregnant, you generally should not have this procedure done. Slight increase in the risk of cancer. This is because of the radiation involved in the test. What happens before the procedure? No preparation is needed for this procedure. What happens during the procedure? You will undress and remove any jewelry around your neck or chest. You will put on a hospital gown. Sticky electrodes will be placed on your chest. The electrodes will be connected to an electrocardiogram (ECG) machine to record a tracing of the electrical activity of your heart. A CT scanner will take pictures of your heart. During this time, you will be asked to lie still and hold your breath for 2-3 seconds while a picture of your heart is being taken. The procedure may  vary among health care providers and hospitals. What happens after the procedure? You can get dressed. You can return to your normal activities. It is up to you to get the results of your test. Ask your health care provider, or the department that is doing the test, when your results will be ready. Summary A coronary calcium scan is an imaging test used to look for deposits of calcium and other fatty materials (plaques) in the  inner lining of the blood vessels of the heart (coronary arteries). Generally, this is a safe procedure. Tell your health care provider if you are pregnant or may be pregnant. No preparation is needed for this procedure. A CT scanner will take pictures of your heart. You can return to your normal activities after the scan is done. This information is not intended to replace advice given to you by your health care provider. Make sure you discuss any questions you have with your health care provider. Document Released: 01/01/2008 Document Revised: 05/24/2016 Document Reviewed: 05/24/2016 Elsevier Interactive Patient Education  2017 [place]: At [facility], you and your health needs are our priority.  As part of our continuing mission to provide you with exceptional heart care, we have created designated Provider Care Teams.  These Care Teams include your primary Cardiologist (physician) and Advanced Practice Providers (APPs -  Physician Assistants and Nurse Practitioners) who all work together to provide you with the care you need, when you need it.  We recommend signing up for the patient portal called "MyChart".  Sign up information is provided on this After Visit Summary.  MyChart is used to connect with patients for Virtual Visits (Telemedicine).  Patients are able to view lab/test results, encounter notes, upcoming appointments, etc.  Non-urgent messages can be sent to your provider as well.   To learn more about what you can do with MyChart, go to [email or web address].    Your next appointment:   3 month(s)  The format for your next appointment:   In Person  Provider:   Donato Heinz, MD     Other Instructions      Signed, Donato Heinz, MD  09/01/2021 6:16 PM    [facility]

## 2021-09-01 ENCOUNTER — Encounter: Payer: Self-pay | Admitting: Cardiology

## 2021-09-01 ENCOUNTER — Other Ambulatory Visit: Payer: Self-pay

## 2021-09-01 ENCOUNTER — Telehealth: Payer: Self-pay | Admitting: Cardiology

## 2021-09-01 ENCOUNTER — Ambulatory Visit (INDEPENDENT_AMBULATORY_CARE_PROVIDER_SITE_OTHER): Payer: Medicare Other | Admitting: Cardiology

## 2021-09-01 VITALS — BP 110/64 | HR 77 | Ht 65.5 in | Wt 192.6 lb

## 2021-09-01 DIAGNOSIS — E785 Hyperlipidemia, unspecified: Secondary | ICD-10-CM

## 2021-09-01 DIAGNOSIS — R29898 Other symptoms and signs involving the musculoskeletal system: Secondary | ICD-10-CM | POA: Diagnosis not present

## 2021-09-01 DIAGNOSIS — I5042 Chronic combined systolic (congestive) and diastolic (congestive) heart failure: Secondary | ICD-10-CM

## 2021-09-01 DIAGNOSIS — Z79899 Other long term (current) drug therapy: Secondary | ICD-10-CM | POA: Diagnosis not present

## 2021-09-01 DIAGNOSIS — M76822 Posterior tibial tendinitis, left leg: Secondary | ICD-10-CM | POA: Diagnosis not present

## 2021-09-01 DIAGNOSIS — D151 Benign neoplasm of heart: Secondary | ICD-10-CM | POA: Diagnosis not present

## 2021-09-01 DIAGNOSIS — M25572 Pain in left ankle and joints of left foot: Secondary | ICD-10-CM | POA: Diagnosis not present

## 2021-09-01 MED ORDER — DAPAGLIFLOZIN PROPANEDIOL 10 MG PO TABS: 10.0000 mg | ORAL_TABLET | Freq: Every day | ORAL | 3 refills | Status: DC

## 2021-09-01 NOTE — Telephone Encounter (Signed)
Calling because there no dx code for CT. Please advise

## 2021-09-01 NOTE — Patient Instructions (Signed)
Medication Instructions:  Your physician has recommended you make the following change in your medication:  START: Farxiga 10 mg once daily HOLD: Crestor for 1 week and let us know if your joint pain gets better.  *If you need a refill on your cardiac medications before your next appointment, please call your pharmacy*   Lab Work: Your physician recommends that you return for lab work in:  1 week: BMET If you have labs (blood work) drawn today and your tests are completely normal, you will receive your results only by: [place] (if you have [place]) OR A paper copy in the mail If you have any lab test that is abnormal or we need to change your treatment, we will call you to review the results.   Testing/Procedures: Dr. Gardiner Rhyme has ordered a CT coronary calcium score.   Test locations:  [facility] ([address] [place], [place] [address]) [facility] ([address] [place], [place] [address])   This is $99 out of pocket.   Coronary CalciumScan A coronary calcium scan is an imaging test used to look for deposits of calcium and other fatty materials (plaques) in the inner lining of the blood vessels of the heart (coronary arteries). These deposits of calcium and plaques can partly clog and narrow the coronary arteries without producing any symptoms or warning signs. This puts a person at risk for a heart attack. This test can detect these deposits before symptoms develop. Tell a health care provider about: Any allergies you have. All medicines you are taking, including vitamins, herbs, eye drops, creams, and over-the-counter medicines. Any problems you or family members have had with anesthetic medicines. Any blood disorders you have. Any surgeries you have had. Any medical conditions you have. Whether you are pregnant or may be pregnant. What are the risks? Generally, this is a safe procedure. However, problems may occur, including: Harm to a pregnant  woman and her unborn baby. This test involves the use of radiation. Radiation exposure can be dangerous to a pregnant woman and her unborn baby. If you are pregnant, you generally should not have this procedure done. Slight increase in the risk of cancer. This is because of the radiation involved in the test. What happens before the procedure? No preparation is needed for this procedure. What happens during the procedure? You will undress and remove any jewelry around your neck or chest. You will put on a hospital gown. Sticky electrodes will be placed on your chest. The electrodes will be connected to an electrocardiogram (ECG) machine to record a tracing of the electrical activity of your heart. A CT scanner will take pictures of your heart. During this time, you will be asked to lie still and hold your breath for 2-3 seconds while a picture of your heart is being taken. The procedure may vary among health care providers and hospitals. What happens after the procedure? You can get dressed. You can return to your normal activities. It is up to you to get the results of your test. Ask your health care provider, or the department that is doing the test, when your results will be ready. Summary A coronary calcium scan is an imaging test used to look for deposits of calcium and other fatty materials (plaques) in the inner lining of the blood vessels of the heart (coronary arteries). Generally, this is a safe procedure. Tell your health care provider if you are pregnant or may be pregnant. No preparation is needed for this procedure.  A CT scanner will take pictures of your heart. You can return to your normal activities after the scan is done. This information is not intended to replace advice given to you by your health care provider. Make sure you discuss any questions you have with your health care provider. Document Released: 01/01/2008 Document Revised: 05/24/2016 Document Reviewed:  05/24/2016 Elsevier Interactive Patient Education  2017 [place]: At [facility], you and your health needs are our priority.  As part of our continuing mission to provide you with exceptional heart care, we have created designated Provider Care Teams.  These Care Teams include your primary Cardiologist (physician) and Advanced Practice Providers (APPs -  Physician Assistants and Nurse Practitioners) who all work together to provide you with the care you need, when you need it.  We recommend signing up for the patient portal called "MyChart".  Sign up information is provided on this After Visit Summary.  MyChart is used to connect with patients for Virtual Visits (Telemedicine).  Patients are able to view lab/test results, encounter notes, upcoming appointments, etc.  Non-urgent messages can be sent to your provider as well.   To learn more about what you can do with MyChart, go to [email or web address].    Your next appointment:   3 month(s)  The format for your next appointment:   In Person  Provider:   Donato Heinz, MD     Other Instructions

## 2021-09-01 NOTE — Telephone Encounter (Signed)
Tried to call number just keeps ringing  DX code=CAD screening, low CAD risk

## 2021-09-02 ENCOUNTER — Other Ambulatory Visit: Payer: Self-pay | Admitting: Cardiology

## 2021-09-02 DIAGNOSIS — I2510 Atherosclerotic heart disease of native coronary artery without angina pectoris: Secondary | ICD-10-CM

## 2021-09-03 DIAGNOSIS — M76822 Posterior tibial tendinitis, left leg: Secondary | ICD-10-CM | POA: Diagnosis not present

## 2021-09-03 DIAGNOSIS — M25572 Pain in left ankle and joints of left foot: Secondary | ICD-10-CM | POA: Diagnosis not present

## 2021-09-03 DIAGNOSIS — R29898 Other symptoms and signs involving the musculoskeletal system: Secondary | ICD-10-CM | POA: Diagnosis not present

## 2021-09-08 DIAGNOSIS — M25572 Pain in left ankle and joints of left foot: Secondary | ICD-10-CM | POA: Diagnosis not present

## 2021-09-09 ENCOUNTER — Other Ambulatory Visit: Payer: Self-pay | Admitting: Adult Health

## 2021-09-10 ENCOUNTER — Encounter (HOSPITAL_COMMUNITY): Payer: Self-pay

## 2021-09-10 DIAGNOSIS — M25572 Pain in left ankle and joints of left foot: Secondary | ICD-10-CM | POA: Diagnosis not present

## 2021-09-11 DIAGNOSIS — Z79899 Other long term (current) drug therapy: Secondary | ICD-10-CM | POA: Diagnosis not present

## 2021-09-12 LAB — BASIC METABOLIC PANEL
BUN/Creatinine Ratio: 17 (ref 12–28)
BUN: 15 mg/dL (ref 8–27)
CO2: 23 mmol/L (ref 20–29)
Calcium: 9.9 mg/dL (ref 8.7–10.3)
Chloride: 103 mmol/L (ref 96–106)
Creatinine, Ser: 0.86 mg/dL (ref 0.57–1.00)
Glucose: 95 mg/dL (ref 70–99)
Potassium: 4.6 mmol/L (ref 3.5–5.2)
Sodium: 141 mmol/L (ref 134–144)
eGFR: 68 mL/min/{1.73_m2} (ref >59)

## 2021-09-14 ENCOUNTER — Other Ambulatory Visit: Payer: Medicare Other

## 2021-09-15 DIAGNOSIS — M25572 Pain in left ankle and joints of left foot: Secondary | ICD-10-CM | POA: Diagnosis not present

## 2021-09-15 DIAGNOSIS — R29898 Other symptoms and signs involving the musculoskeletal system: Secondary | ICD-10-CM | POA: Diagnosis not present

## 2021-09-15 DIAGNOSIS — M76822 Posterior tibial tendinitis, left leg: Secondary | ICD-10-CM | POA: Diagnosis not present

## 2021-09-16 ENCOUNTER — Telehealth: Payer: Self-pay | Admitting: Cardiology

## 2021-09-16 NOTE — Telephone Encounter (Signed)
Pt c/o medication issue: ? ?1. Name of Medication: dapagliflozin propanediol (FARXIGA) 10 MG TABS tablet ? ?2. How are you currently taking this medication (dosage and times per day)? Take 1 tablet (10 mg total) by mouth daily before breakfast. ? ?3. Are you having a reaction (difficulty breathing--STAT)? no ? ?4. What is your medication issue? Want to discuss with the nurse about medication  ? ?

## 2021-09-16 NOTE — Telephone Encounter (Signed)
Left message to call back  

## 2021-09-21 ENCOUNTER — Telehealth: Payer: Self-pay | Admitting: Cardiology

## 2021-09-21 NOTE — Telephone Encounter (Signed)
Patient is returning phone call about her medication dapagliflozin propanediol (FARXIGA) 10 MG TABS tablet. Please call back to discuss ?

## 2021-09-21 NOTE — Telephone Encounter (Signed)
Spoke to patient, requesting to complete patient assistance for [place].   Advised will complete provider portion today and patient plans to pick up in office tomorrow.   ?

## 2021-09-22 DIAGNOSIS — R29898 Other symptoms and signs involving the musculoskeletal system: Secondary | ICD-10-CM | POA: Diagnosis not present

## 2021-09-22 DIAGNOSIS — M76822 Posterior tibial tendinitis, left leg: Secondary | ICD-10-CM | POA: Diagnosis not present

## 2021-09-22 DIAGNOSIS — M25572 Pain in left ankle and joints of left foot: Secondary | ICD-10-CM | POA: Diagnosis not present

## 2021-09-25 ENCOUNTER — Telehealth: Payer: Self-pay | Admitting: Adult Health

## 2021-09-25 NOTE — Telephone Encounter (Signed)
.  Called patient to schedule appointment per 2/14, patient is aware of date and time.   ?

## 2021-09-29 DIAGNOSIS — M25572 Pain in left ankle and joints of left foot: Secondary | ICD-10-CM | POA: Diagnosis not present

## 2021-09-30 ENCOUNTER — Telehealth: Payer: Self-pay | Admitting: *Deleted

## 2021-09-30 NOTE — Telephone Encounter (Signed)
Farxiga patient assistance faxed to [place] ?

## 2021-10-01 ENCOUNTER — Other Ambulatory Visit: Payer: Self-pay

## 2021-10-01 ENCOUNTER — Ambulatory Visit
Admission: RE | Admit: 2021-10-01 | Discharge: 2021-10-01 | Disposition: A | Discharge status: Home / Self Care | Payer: Medicare Other | Source: Ambulatory Visit | Attending: Adult Health | Admitting: Adult Health

## 2021-10-01 ENCOUNTER — Ambulatory Visit: Admission: RE | Admit: 2021-10-01 | Payer: Medicare Other | Source: Ambulatory Visit

## 2021-10-01 DIAGNOSIS — R922 Inconclusive mammogram: Secondary | ICD-10-CM | POA: Diagnosis not present

## 2021-10-02 DIAGNOSIS — R29898 Other symptoms and signs involving the musculoskeletal system: Secondary | ICD-10-CM | POA: Diagnosis not present

## 2021-10-02 DIAGNOSIS — M76822 Posterior tibial tendinitis, left leg: Secondary | ICD-10-CM | POA: Diagnosis not present

## 2021-10-02 NOTE — Telephone Encounter (Signed)
Patient walked in requesting call to discuss patient assistance application ? ? ?Attempt to call patient-lmtcb. ?

## 2021-10-02 NOTE — Telephone Encounter (Signed)
Patient was returning call 

## 2021-10-06 NOTE — Telephone Encounter (Signed)
Returned call to patient-patient aware she was approved for assistance ?

## 2021-10-06 NOTE — Telephone Encounter (Signed)
Received fax from [place] approved 10/02/21-07/18/22 ?

## 2021-10-21 DIAGNOSIS — M76822 Posterior tibial tendinitis, left leg: Secondary | ICD-10-CM | POA: Diagnosis not present

## 2021-11-09 ENCOUNTER — Ambulatory Visit: Payer: Medicare Other | Attending: General Surgery | Admitting: Physical Therapy

## 2021-11-09 DIAGNOSIS — Z483 Aftercare following surgery for neoplasm: Secondary | ICD-10-CM | POA: Insufficient documentation

## 2021-11-09 NOTE — Therapy (Signed)
?OUTPATIENT PHYSICAL THERAPY SOZO SCREENING NOTE ? ? ?Patient Name: Claudia Ortiz ?MRN: [id number] ?DOB:Feb 02, 1941, 81 y.o., female ?Today's Date: 11/09/2021 ? ?PCP: Kelton Pillar, MD ?REFERRING PROVIDER: Rolm Bookbinder, MD ? ? PT End of Session - 11/09/21 1151   ? ? Visit Number 2   ? Number of Visits 2   ? PT Start Time 1115   ? PT Stop Time 1130   ? PT Time Calculation (min) 15 min   ? Activity Tolerance Patient tolerated treatment well   ? Behavior During Therapy [facility] for tasks assessed/performed   ? ?  ?  ? ?  ? ? ?Past Medical History:  ?Diagnosis Date  ? Anxiety   ? Arthritis 2019  ? Asthma   ? As Child  ? Cancer ([place]) 05/2020  ? R breast  ? Colon polyp   ? Elevated cholesterol   ? Intraductal papilloma of left breast   ?  precancerous cells in R breast  ? Tricuspid valve mass   ? on 11/21/20 echo  ? ?Past Surgical History:  ?Procedure Laterality Date  ? AXILLARY SENTINEL NODE BIOPSY Right 05/12/2021  ? Procedure: AXILLARY SENTINEL NODE BIOPSY WITH MAGTRACE;  Surgeon: Rolm Bookbinder, MD;  Location: [facility];  Service: General;  Laterality: Right;  ? BREAST LUMPECTOMY WITH RADIOACTIVE SEED AND SENTINEL LYMPH NODE BIOPSY Right 05/12/2021  ? Procedure: RIGHT BREAST LUMPECTOMY WITH RADIOACTIVE SEED X3;  Surgeon: Rolm Bookbinder, MD;  Location: [facility];  Service: General;  Laterality: Right;  ? colon polyp    ? removed  ? CYST REMOVAL TRUNK    ? breast   ? EYE SURGERY Left   ? cataract removal  ? FOOT SURGERY    ? PORTACATH PLACEMENT Left 09/02/2020  ? Procedure: INSERTION PORT-A-CATH;  Surgeon: Rolm Bookbinder, MD;  Location: [facility];  Service: General;  Laterality: Left;  ? TEE WITHOUT CARDIOVERSION N/A 01/01/2021  ? Procedure: TRANSESOPHAGEAL ECHOCARDIOGRAM (TEE);  Surgeon: Elouise Munroe, MD;  Location: [facility];  Service: Cardiology;  Laterality: N/A;  ? TONSILLECTOMY    ? as a child  ? ?Patient Active Problem List  ? Diagnosis Date Noted  ? HFrEF (heart failure with reduced  ejection fraction) ([place]) 07/03/2021  ? Tricuspid valve mass   ? Port-A-Cath in place 09/10/2020  ? Malignant neoplasm of lower-outer quadrant of right breast of female, estrogen receptor positive ([place]) 07/14/2020  ? Dyspnea 02/18/2014  ? Fatigue 02/18/2014  ? Abnormal ECG 02/18/2014  ? History of left breast cancer 02/18/2014  ? Hyperlipidemia 02/18/2014  ? ? ?REFERRING DIAG: right breast cancer at risk for lymphedema ? ?THERAPY DIAG:  ?Aftercare following surgery for neoplasm ? ?PERTINENT HISTORY: Patient was diagnosed on 06/17/2020 with right grade III invasive ductal carcinoma breast cancer. She underwent neoadjuvant chemotherapy 09/03/2020 - 11/19/2020 followed by a right lumpectomy and sentinel node biopsy (3 negative nodes) on 10/25/202. It is ER positive, PR negative and HER2 positive with a Ki67 of 40%.  ? ?PRECAUTIONS: right UE Lymphedema risk ? ?SUBJECTIVE: Here for SOZO screen; doing well. ? ?PAIN:  ?Are you having pain? No ? ?SOZO SCREENING: ?Patient was assessed today using the SOZO machine to determine the lymphedema index score. This was compared to her baseline score. It was determined that she is within the recommended range when compared to her baseline and no further action is needed at this time. She will continue SOZO screenings. These are done every 3 months for 2 years post operatively followed by every  6 months for 2 years, and then annually. ? ?L-DEX LYMPHEDEMA SCREENING ?Measurement Type: Unilateral ?L-DEX MEASUREMENT EXTREMITY: Upper Extremity ?POSITION : Standing ?DOMINANT SIDE: Right ?At Risk Side: Right ?BASELINE SCORE (UNILATERAL): 1.8 ?L-DEX SCORE (UNILATERAL): 1.7 ?VALUE CHANGE (UNILAT): -0.1 ? ?Annia Friendly, PT ?11/09/21 11:59 AM ? ? ?

## 2021-11-11 DIAGNOSIS — Z0000 Encounter for general adult medical examination without abnormal findings: Secondary | ICD-10-CM | POA: Diagnosis not present

## 2021-11-11 DIAGNOSIS — C50919 Malignant neoplasm of unspecified site of unspecified female breast: Secondary | ICD-10-CM | POA: Diagnosis not present

## 2021-11-11 DIAGNOSIS — I5030 Unspecified diastolic (congestive) heart failure: Secondary | ICD-10-CM | POA: Diagnosis not present

## 2021-11-11 DIAGNOSIS — H0520 Unspecified exophthalmos: Secondary | ICD-10-CM | POA: Diagnosis not present

## 2021-11-11 DIAGNOSIS — M810 Age-related osteoporosis without current pathological fracture: Secondary | ICD-10-CM | POA: Diagnosis not present

## 2021-11-11 DIAGNOSIS — Z23 Encounter for immunization: Secondary | ICD-10-CM | POA: Diagnosis not present

## 2021-11-11 DIAGNOSIS — E7800 Pure hypercholesterolemia, unspecified: Secondary | ICD-10-CM | POA: Diagnosis not present

## 2021-11-26 ENCOUNTER — Encounter: Payer: Self-pay | Admitting: Adult Health

## 2021-11-26 ENCOUNTER — Inpatient Hospital Stay: Payer: Medicare Other | Attending: Hematology and Oncology | Admitting: Adult Health

## 2021-11-26 DIAGNOSIS — Z170 Estrogen receptor positive status [ER+]: Secondary | ICD-10-CM | POA: Diagnosis not present

## 2021-11-26 DIAGNOSIS — C50511 Malignant neoplasm of lower-outer quadrant of right female breast: Secondary | ICD-10-CM

## 2021-11-26 NOTE — Assessment & Plan Note (Signed)
07/14/2020:Screening mammogram showed a 1.5cm right breast mass. US showed the 1.4cm mass at the 7 o'clock position, a 2.7cm mass at the 9 o'clock position, and benign right axillary lymph nodes. Biopsy showed ALH at the 9 o'clock position, and at the 7 o'clock position, IDC, grade 3, HER-2 positive (3+), ER+ 20% weak, PR- 0%, Ki67 40%. ?? ?Treatment Plan: ?1. Neoadjuvant chemotherapy with?Taxol Herceptin?followed by Herceptin maintenance for 1 year ?2. 05/12/2021: Pathologic complete response  ?Right lumpectomy: LCIS involving CSL, intraductal papilloma negative for IDC or DCIS ?Right lateral margin lumpectomy: LCIS, ADH Lymph nodes: 3 negative ?3. Followed by adjuvant radiation therapy if patient had lumpectomy ?4.??Followed by adjuvant antiestrogen therapy ?------------------------------------------------------------------------------------------------------------------------------- ?Current treatment: Anastrozole ? ?Claudia Ortiz has been on the anastrozole for approximately 3 months.  She is concerned that this is exacerbated her joint issue in her foot.  I recommended that she take a 2-week holiday from the medication and if it is not improved she will call us and we will change her antiestrogen therapy.  If it is not improved then she will restart the anastrozole. ? ?We discussed her recent mammogram in detail.  I have sent Dr. Donne Hazel a staff message with her concerns and asked that he reach out to her to discuss the imaging further. ? ?Claudia Ortiz has a follow-up with Dr. Lindi Adie in August 2023. ?

## 2021-11-26 NOTE — Progress Notes (Signed)
Claudia Ortiz Follow up: ?  ? ?Kelton Pillar, MD ?[address] ?[place] [address] ? ?I connected with Debbora Dus on 11/26/21 at 10:15 AM EDT by MyChart video and verified that I am speaking with the correct person using two identifiers.  ?I discussed the limitations, risks, security and privacy concerns of performing an evaluation and management service virtually and the availability of in person appointments.  ?I also discussed with the patient that there may be a patient responsible charge related to this service. The patient expressed understanding and agreed to proceed.  ?Patient location: Home ?Provider location: [facility] in [place] in office ?Others participating in call: Not ? ?DIAGNOSIS:  Ortiz Staging  ?Malignant neoplasm of lower-outer quadrant of right breast of female, estrogen receptor positive ([place]) ?Staging form: Breast, AJCC 8th Edition ?- Clinical stage from 07/23/2020: Stage IA (cT1c, cN0, cM0, G3, ER+, PR-, HER2+) - Unsigned ?Stage prefix: Initial diagnosis ?Method of lymph node assessment: Clinical ? ? ?SUMMARY OF ONCOLOGIC HISTORY: ?Oncology History  ?Malignant neoplasm of lower-outer quadrant of right breast of female, estrogen receptor positive ([place])  ?07/14/2020 Initial Diagnosis  ? Screening mammogram showed a 1.5cm right breast mass. US showed the 1.4cm mass at the 7 o'clock position, a 2.7cm mass at the 9 o'clock position, and benign right axillary lymph nodes. Biopsy showed ALH at the 9 o'clock position, and at the 7 o'clock position, IDC, grade 3, HER-2 positive (3+), ER+ 20% weak, PR- 0%, Ki67 40%. ?  ?09/03/2020 - 11/19/2020 Neo-Adjuvant Chemotherapy  ? Weekly Taxol (dose reduced) and Herceptin x 12 ?  ?12/10/2020 - 03/25/2021 Chemotherapy  ? Patient is on Treatment Plan : BREAST Trastuzumab q21d  ?  ?  ?05/12/2021 Surgery  ? Right lumpectomy: LCIS involving CSL, intraductal papilloma negative for IDC or DCIS ?Right lateral  margin lumpectomy: LCIS, ADH ?Lymph nodes: 3 negative ? ?  ?08/2021 -  Anti-estrogen oral therapy  ? Anastrozole daily ?  ? ? ?CURRENT THERAPY: Anastrozole ? ?INTERVAL HISTORY: ?Claudia Ortiz 81 y.o. female returns for follow-up since starting anastrozole for maintenance therapy for her estrogen positive breast Ortiz.  She tells me she is tolerating it moderately well however is concerned she may be experiencing increased joint aches and pains.  This is particularly noticeable in one of her feet she is having troubles with the tendon and has been recommended to undergo surgery.  She wants to know if there is a different medication she could take or if she could take some time off to help understand if the anastrozole is exacerbating the issue. ? ?She also underwent bilateral breast mammogram in March 2023.  The results reviewed that she had a retained clip and the mammogram results were called by the radiologist to Dr. Cristal Generous office who left a message with his nurse Jahmia.  The patient has not heard back and is wondering about the status of the retained clip and if she needs additional surgery or imaging. ? ? ?Patient Active Problem List  ? Diagnosis Date Noted  ? HFrEF (heart failure with reduced ejection fraction) ([place]) 07/03/2021  ? Tricuspid valve mass   ? Port-A-Cath in place 09/10/2020  ? Malignant neoplasm of lower-outer quadrant of right breast of female, estrogen receptor positive ([place]) 07/14/2020  ? Dyspnea 02/18/2014  ? Fatigue 02/18/2014  ? Abnormal ECG 02/18/2014  ? History of left breast Ortiz 02/18/2014  ? Hyperlipidemia 02/18/2014  ? ? ?is allergic to iodine, shellfish allergy,  atorvastatin, cephalosporins, and penicillins. ? ?MEDICAL HISTORY: ?Past Medical History:  ?Diagnosis Date  ? Anxiety   ? Arthritis 2019  ? Asthma   ? As Child  ? Ortiz ([place]) 05/2020  ? R breast  ? Colon polyp   ? Elevated cholesterol   ? Intraductal papilloma of left breast   ?  precancerous cells in R breast  ?  Tricuspid valve mass   ? on 11/21/20 echo  ? ? ?SURGICAL HISTORY: ?Past Surgical History:  ?Procedure Laterality Date  ? AXILLARY SENTINEL NODE BIOPSY Right 05/12/2021  ? Procedure: AXILLARY SENTINEL NODE BIOPSY WITH MAGTRACE;  Surgeon: Rolm Bookbinder, MD;  Location: [facility];  Service: General;  Laterality: Right;  ? BREAST LUMPECTOMY WITH RADIOACTIVE SEED AND SENTINEL LYMPH NODE BIOPSY Right 05/12/2021  ? Procedure: RIGHT BREAST LUMPECTOMY WITH RADIOACTIVE SEED X3;  Surgeon: Rolm Bookbinder, MD;  Location: [facility];  Service: General;  Laterality: Right;  ? colon polyp    ? removed  ? CYST REMOVAL TRUNK    ? breast   ? EYE SURGERY Left   ? cataract removal  ? FOOT SURGERY    ? PORTACATH PLACEMENT Left 09/02/2020  ? Procedure: INSERTION PORT-A-CATH;  Surgeon: Rolm Bookbinder, MD;  Location: [facility];  Service: General;  Laterality: Left;  ? TEE WITHOUT CARDIOVERSION N/A 01/01/2021  ? Procedure: TRANSESOPHAGEAL ECHOCARDIOGRAM (TEE);  Surgeon: Elouise Munroe, MD;  Location: [facility];  Service: Cardiology;  Laterality: N/A;  ? TONSILLECTOMY    ? as a child  ? ? ?SOCIAL HISTORY: ?Social History  ? ?Socioeconomic History  ? Marital status: Married  ?  Spouse name: Not on file  ? Number of children: Not on file  ? Years of education: Not on file  ? Highest education level: Not on file  ?Occupational History  ? Not on file  ?Tobacco Use  ? Smoking status: Never  ? Smokeless tobacco: Never  ?Vaping Use  ? Vaping Use: Never used  ?Substance and Sexual Activity  ? Alcohol use: No  ? Drug use: No  ? Sexual activity: Yes  ?Other Topics Concern  ? Not on file  ?Social History Narrative  ? Not on file  ? ?Social Determinants of Health  ? ?Financial Resource Strain: Not on file  ?Food Insecurity: Not on file  ?Transportation Needs: Not on file  ?Physical Activity: Not on file  ?Stress: Not on file  ?Social Connections: Not on file  ?Intimate Partner Violence: Not on file  ? ? ?FAMILY HISTORY: ?Family  History  ?Problem Relation Age of Onset  ? Diabetes Mother   ? Heart attack Mother   ? Stroke Father   ? Heart disease Father   ? Heart attack Brother   ? ? ?Review of Systems  ?Constitutional:  Negative for appetite change, chills, fatigue, fever and unexpected weight change.  ?HENT:   Negative for hearing loss, lump/mass and trouble swallowing.   ?Eyes:  Negative for eye problems and icterus.  ?Respiratory:  Negative for chest tightness, cough and shortness of breath.   ?Cardiovascular:  Negative for chest pain, leg swelling and palpitations.  ?Gastrointestinal:  Negative for abdominal distention, abdominal pain, constipation, diarrhea, nausea and vomiting.  ?Endocrine: Negative for hot flashes.  ?Genitourinary:  Negative for difficulty urinating.   ?Musculoskeletal:  Positive for arthralgias.  ?Skin:  Negative for itching and rash.  ?Neurological:  Negative for dizziness, extremity weakness, headaches and numbness.  ?Hematological:  Negative for adenopathy. Does not bruise/bleed easily.  ?  Psychiatric/Behavioral:  Negative for depression. The patient is not nervous/anxious.    ? ? ?PHYSICAL EXAMINATION ? ?ECOG PERFORMANCE STATUS: 1 - Symptomatic but completely ambulatory ?Patient appears well she is in no apparent distress.  Mood and behavior are normal.  Skin visualized is without rash or lesion.  Her breathing is nonlabored. ? ? ?  ? ?ASSESSMENT and THERAPY PLAN:  ? ?Malignant neoplasm of lower-outer quadrant of right breast of female, estrogen receptor positive ([place]) ?07/14/2020:Screening mammogram showed a 1.5cm right breast mass. US showed the 1.4cm mass at the 7 o'clock position, a 2.7cm mass at the 9 o'clock position, and benign right axillary lymph nodes. Biopsy showed ALH at the 9 o'clock position, and at the 7 o'clock position, IDC, grade 3, HER-2 positive (3+), ER+ 20% weak, PR- 0%, Ki67 40%. ?  ?Treatment Plan: ?1. Neoadjuvant chemotherapy with Taxol Herceptin followed by Herceptin maintenance for 1  year ?2. 05/12/2021: Pathologic complete response  ?Right lumpectomy: LCIS involving CSL, intraductal papilloma negative for IDC or DCIS ?Right lateral margin lumpectomy: LCIS, ADH Lymph nodes: 3 negative ?3. Fol

## 2021-12-15 ENCOUNTER — Telehealth: Payer: Self-pay

## 2021-12-15 NOTE — Telephone Encounter (Signed)
Pt called and LVM stating she has a question regarding her anastrozole. Attempted to return pt's call. LVM for call back.

## 2021-12-30 ENCOUNTER — Ambulatory Visit: Payer: Medicare Other | Admitting: Cardiology

## 2021-12-30 ENCOUNTER — Encounter: Payer: Self-pay | Admitting: Cardiology

## 2021-12-30 VITALS — BP 128/68 | HR 73 | Ht 65.5 in | Wt 193.0 lb

## 2021-12-30 DIAGNOSIS — I5042 Chronic combined systolic (congestive) and diastolic (congestive) heart failure: Secondary | ICD-10-CM

## 2021-12-30 DIAGNOSIS — D151 Benign neoplasm of heart: Secondary | ICD-10-CM | POA: Diagnosis not present

## 2021-12-30 DIAGNOSIS — Z79899 Other long term (current) drug therapy: Secondary | ICD-10-CM | POA: Diagnosis not present

## 2021-12-30 DIAGNOSIS — E785 Hyperlipidemia, unspecified: Secondary | ICD-10-CM

## 2021-12-30 DIAGNOSIS — I5022 Chronic systolic (congestive) heart failure: Secondary | ICD-10-CM

## 2021-12-30 NOTE — Progress Notes (Signed)
Cardiology Office Note:    Date:  01/04/2022   ID:  Claudia Ortiz, DOB [birth year], MRN [id number]  PCP:  Kelton Pillar, MD   [facility] Providers Cardiologist:  Donato Heinz, MD     Referring MD: Kelton Pillar, MD   CC: CHF  History of Present Illness:    Claudia Ortiz is a 81 y.o. female with a hx of anxiety, elevated cholesterol, colon polyp, intraductal papilloma of left breast, and tricuspid valve mass who presents for follow-up.  She was refered by Dr. Laurann Montana for pre-op clearance for lumpectomy, initially seen on 12/23/2020.  Echocardiogram on 11/21/2020 showed EF 50 to 55%, normal RV function, echodensity on the tricuspid valve measuring 1 cm, mild TR. Transesophageal echocardiogram on 01/01/2021 showed 1.1 x 0.8 cm mass adherent to the atrial aspect of the posterior tricuspid valve leaflet, likely representing thrombus.  Also with small mobile echodensity on the pulmonary artery aspect of the pulmonary valve, likely representing thrombus.  EF 50%, normal RV function, small pericardial effusion, small PFO.  Lower extremity duplex on 01/02/2021 was negative.  Echocardiogram 04/07/2021 showed EF 35 to 40%, global hypokinesis, and grade 1 diastolic dysfunction, normal RV function, mild MR, unchanged tricuspid valve mass.  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  Since last clinic visit, she reports she has been doing okay.  Had an injury to tendon in the left leg.  Has had some swelling related to this.  Denies any chest pain, dyspnea, lightheadedness, syncope, or palpitations.    Past Medical History:  Diagnosis Date   Anxiety    Arthritis 2019   Asthma    As Child   Cancer ([place]) 05/2020   R breast   Colon polyp    Elevated cholesterol    Intraductal papilloma of left breast     precancerous cells in  R breast   Tricuspid valve mass    on 11/21/20 echo    Past Surgical History:  Procedure Laterality Date   AXILLARY SENTINEL NODE BIOPSY Right 05/12/2021   Procedure: AXILLARY SENTINEL NODE BIOPSY WITH MAGTRACE;  Surgeon: Rolm Bookbinder, MD;  Location: [place];  Service: General;  Laterality: Right;   BREAST LUMPECTOMY WITH RADIOACTIVE SEED AND SENTINEL LYMPH NODE BIOPSY Right 05/12/2021   Procedure: RIGHT BREAST LUMPECTOMY WITH RADIOACTIVE SEED X3;  Surgeon: Rolm Bookbinder, MD;  Location: [place];  Service: General;  Laterality: Right;   colon polyp     removed   CYST REMOVAL TRUNK     breast    EYE SURGERY Left    cataract removal   FOOT SURGERY     PORTACATH PLACEMENT Left 09/02/2020   Procedure: INSERTION PORT-A-CATH;  Surgeon: Rolm Bookbinder, MD;  Location: [place];  Service: General;  Laterality: Left;   TEE WITHOUT CARDIOVERSION N/A 01/01/2021   Procedure: TRANSESOPHAGEAL ECHOCARDIOGRAM (TEE);  Surgeon: Elouise Munroe, MD;  Location: [place];  Service: Cardiology;  Laterality: N/A;   TONSILLECTOMY     as a child    Current Medications: Current Meds  Medication Sig   anastrozole (ARIMIDEX) 1 MG tablet Take 1 tablet (1 mg total) by mouth daily.   Apoaequorin (PREVAGEN) 10 MG CAPS Take 10 mg by mouth daily.   aspirin EC 81 MG tablet Take 1 tablet (81 mg total) by mouth daily. Swallow whole.   b complex  vitamins capsule Take 1 capsule by mouth daily.   Bioflavonoid Products (SUPER C-500 COMPLEX PO) Take 1 tablet by mouth daily.   Biotin 5000 MCG CAPS Take 5,000 mcg by mouth daily.   cholecalciferol (VITAMIN D3) 25 MCG (1000 UNIT) tablet Take 1,000 Units by mouth daily.   dapagliflozin propanediol (FARXIGA) 10 MG TABS tablet Take 1 tablet (10 mg total) by mouth daily before breakfast.   Evening Primrose Oil 500 MG CAPS Take 500 mg by mouth daily.   Garlic 6962 MG CAPS Take 1,000 mg by mouth daily.   losartan (COZAAR) 25 MG tablet Take 1 tablet  (25 mg total) by mouth daily.   metoprolol succinate (TOPROL XL) 25 MG 24 hr tablet Take 1.5 tablets (37.5 mg total) by mouth daily.   Multiple Vitamin (MULTIVITAMIN) tablet Take 1 tablet by mouth daily.   PFIZER-BIONT COVID-19 VAC-TRIS SUSP injection    rosuvastatin (CRESTOR) 10 MG tablet Take 1 tablet (10 mg total) by mouth every other day.   spironolactone (ALDACTONE) 25 MG tablet Take 0.5 tablets (12.5 mg total) by mouth daily.   Vitamin A 7.5 MG (25000 UT) CAPS Take 25,000 Units by mouth daily.     Allergies:   Iodine, Shellfish allergy, Atorvastatin, Cephalosporins, and Penicillins   Social History   Socioeconomic History   Marital status: Married    Spouse name: Not on file   Number of children: Not on file   Years of education: Not on file   Highest education level: Not on file  Occupational History   Not on file  Tobacco Use   Smoking status: Never   Smokeless tobacco: Never  Vaping Use   Vaping Use: Never used  Substance and Sexual Activity   Alcohol use: No   Drug use: No   Sexual activity: Yes  Other Topics Concern   Not on file  Social History Narrative   Not on file   Social Determinants of Health   Financial Resource Strain: Not on file  Food Insecurity: Not on file  Transportation Needs: Not on file  Physical Activity: Not on file  Stress: Not on file  Social Connections: Not on file     Family History: The patient's family history includes Diabetes in her mother; Heart attack in her brother and mother; Heart disease in her father; Stroke in her father.  ROS:   Please see the history of present illness.     All other systems reviewed and are negative.  EKGs/Labs/Other Studies Reviewed:    The following studies were reviewed today:  [place] LE Venous 01/02/2021: RIGHT:  - No evidence of deep vein thrombosis in the lower extremity. No indirect  evidence of obstruction proximal to the inguinal ligament.  - No cystic structure found in the popliteal  fossa.     LEFT:  - No evidence of deep vein thrombosis in the lower extremity. No indirect  evidence of obstruction proximal to the inguinal ligament.  - No cystic structure found in the popliteal fossa.  - Small amount of superficial edema seen at left medial ankle.   TEE 01/01/2021:  1. There is a 1.1 x 0.8 cm mass that appears adherent to the atrial  aspect of the posterior tricuspid valve leaflet. In the setting of  malignancy, this mass likely represents thrombus. . The tricuspid valve is  abnormal.   2. There is a small mobile echodensity on the PA aspect of the pulmonary  valve. This mass likely represents thrombus given findings on  tricuspid  valve. . The pulmonic valve was abnormal.   3. Left ventricular ejection fraction, by estimation, is 50%. The left  ventricle has low normal function.   4. Right ventricular systolic function is normal. The right ventricular  size is normal.   5. No left atrial/left atrial appendage thrombus was detected. The LAA  emptying velocity was 56 cm/s.   6. A small pericardial effusion is present. The pericardial effusion is  in the transverse sinus. There is a small amount of epicardial fat noted  in the transverse sinus freely mobile in pericardial fluid.   7. The mitral valve is normal in structure. Trivial mitral valve  regurgitation.   8. The aortic valve is grossly normal. There is mild calcification of the  aortic valve. Aortic valve regurgitation is not visualized. No aortic  stenosis is present.   9. Evidence of atrial level shunting detected by color flow Doppler.  There is a small patent foramen ovale with predominantly left to right  shunting across the atrial septum. Possible small fenestration in atrial  septum.   Comparison(s): Tricuspid valve mass was present on the study from 11/21/20.  On review of TTE from 07/25/20, this mass may have been present at that time  as well, given similar location of echo density, however more  subtle and  image quality did not optimize.   Conclusion(s)/Recommendation(s): Critical findings reported to Dr.  Gardiner Rhyme and acknowledged at 01/01/21 3:53 pm.   Echo 11/21/2020: 1. Basal inferior hypokinesis. . Left ventricular ejection fraction, by  estimation, is 50 to 55%. The left ventricle has low normal function.   2. Right ventricular systolic function is normal. The right ventricular  size is normal. There is normal pulmonary artery systolic pressure.   3. Trivial mitral valve regurgitation.   4. 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 It measures 11 x 10 mm. Review of echo from Jan 2022 (the only previous echo) the echodensity appears to be present there as well though windows are not as good. Would consider TEE to further evaluate.   5. The aortic valve is normal in structure. Aortic valve regurgitation is  not visualized.   6. The inferior vena cava is normal in size with greater than 50%  respiratory variability, suggesting right atrial pressure of 3 mmHg.  Echo 07/25/2020: 1. Left ventricular ejection fraction, by estimation, is 55 to 60%. The  left ventricle has normal function. The left ventricle has no regional  wall motion abnormalities. Left ventricular diastolic parameters are  consistent with Grade I diastolic  dysfunction (impaired relaxation).   2. Right ventricular systolic function is normal. The right ventricular  size is normal.   3. The mitral valve is normal in structure. Trivial mitral valve  regurgitation. No evidence of mitral stenosis.   4. The aortic valve is tricuspid. Aortic valve regurgitation is not  visualized. No aortic stenosis is present.   Conclusion(s)/Recommendation(s): Normal biventricular function without evidence of hemodynamically significant valvular heart disease.  EKG:   04/13/21: NSR, rate 82, poor R wave progression, Q waves III, aVF 01/05/2021: EKG is not ordered today. 12/23/2020: NSR, rate  89, Low voltage, Nonspecific T wave flattening  Recent Labs: 05/27/2021: ALT 18; Hemoglobin 12.1; Platelet Count 208 12/30/2021: BUN 20; Creatinine, Ser 1.12; Magnesium 2.1; Potassium 4.8; Sodium 143  Recent Lipid Panel    Component Value Date/Time   CHOL 143 12/30/2021 1653   TRIG 77 12/30/2021 1653   HDL  56 12/30/2021 1653   CHOLHDL 2.6 12/30/2021 1653   LDLCALC 72 12/30/2021 1653      Physical Exam:    VS:  BP 128/68 (BP Location: Left Arm, Patient Position: Sitting, Cuff Size: Large)   Pulse 73   Ht 5' 5.5" (1.664 m)   Wt 193 lb (87.5 kg)   SpO2 94%   BMI 31.63 kg/m     Wt Readings from Last 3 Encounters:  12/30/21 193 lb (87.5 kg)  09/01/21 192 lb 9.6 oz (87.4 kg)  08/27/21 193 lb 8 oz (87.8 kg)     GEN: Well nourished, well developed in no acute distress HEENT: Normal NECK: No JVD; No carotid bruits LYMPHATICS: No lymphadenopathy CARDIAC: RRR, no murmurs, rubs, gallops RESPIRATORY:  Clear to auscultation without rales, wheezing or rhonchi  ABDOMEN: Soft, non-tender, non-distended MUSCULOSKELETAL:  Trace LE edema; No deformity  SKIN: Warm and dry NEUROLOGIC:  Alert and oriented x 3 PSYCHIATRIC:  Normal affect   ASSESSMENT:    1. Chronic combined systolic and diastolic heart failure ([place])   2. Medication management   3. Papillary fibroelastoma   4. Hyperlipidemia, unspecified hyperlipidemia type         PLAN:    Tricuspid valve fibroelastoma: Echocardiogram on 11/21/2020 showed echodensity on the tricuspid valve measuring 1 cm, mild TR.  Transesophageal echocardiogram on 01/01/2021 showed 1.1 x 0.8 cm mass adherent to the atrial aspect of the posterior tricuspid valve leaflet, likely representing thrombus.  Also with small mobile echodensity on the pulmonary artery aspect of the pulmonary valve, likely representing thrombus.  EF 50%, normal RV function, small pericardial effusion, small PFO.  Lower extremity duplex on 01/02/2021 was negative for DVT   Echocardiogram 04/07/2021 showed EF 35 to 40%, global hypokinesis, and grade 1 diastolic dysfunction, normal RV function, mild MR, unchanged tricuspid valve mass.  Stress MRI on 04/18/2021 showed mobile mass attached to septal leaflet of tricuspid valve consistent with papillary fibroelatoma -Blood cultures negative when mass initially seen.  Initially suspected thrombus, completed 3 months of anticoagulation with repeat echo showing unchanged mass.  Cardiac MRI confirms fibroelastoma -Given right-sided fibroelastoma and considering age and no history of embolic events, do not recommend surgery -Discussed continuing Eliquis, which may have some benefit given fibroelastoma but discussed that there is little data on this.  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  Her preference is to stop the Eliquis and I think this is reasonable.  Switched to aspirin 81 mg daily  Chronic combined systolic and diastolic heart failure: Echocardiogram 04/07/2021 showed EF 35 to 40%, global hypokinesis, and grade 1 diastolic dysfunction, normal RV function, mild MR, unchanged tricuspid valve mass.  Stress MRI on 04/18/2021 showed no evidence of ischemia on stress perfusion, no LGE, LVEF 43%, RVEF 53%.  Echo 05/25/2021 showed EF 40%, normal RV function, mild to moderate left atrial dilatation, mild right atrial dilatation, 1 cm x 0.9 cm tricuspid valve mass. -Continue Toprol-XL 25 mg daily -Continue losartan 25 mg daily.  Discussed switching to [facility] but she would prefer to hold off -Continue spironolactone 12.5 mg daily -Continue Farxiga 10 mg daily -Check BMET, magnesium -Repeat echo prior to next clinic visit to monitor for improvement in systolic function  Breast cancer: Underwent lumpectomy on 05/12/2021.    Hyperlipidemia: LDL 151 06/10/21, started rosuvastatin 10 mg daily.  Recheck lipid panel.  Will check  calcium score to guide how  aggressive to be in lowering cholesterol.  RTC in 4 months   Medication Adjustments/Labs and Tests Ordered: Current medicines are reviewed at length with the patient today.  Concerns regarding medicines are outlined above.  Orders Placed This Encounter  Procedures   Lipid panel   Magnesium   Basic metabolic panel   ECHOCARDIOGRAM COMPLETE      No orders of the defined types were placed in this encounter.     Patient Instructions  Medication Instructions:  Your Physician recommend you continue on your current medication as directed.    *If you need a refill on your cardiac medications before your next appointment, please call your pharmacy*   Lab Work: Your physician recommends that you return for lab work today (lipid, Mg, BMP)  If you have labs (blood work) drawn today and your tests are completely normal, you will receive your results only by: MyChart Message (if you have MyChart) OR A paper copy in the mail If you have any lab test that is abnormal or we need to change your treatment, we will call you to review the results.   Testing/Procedures: Your physician has requested that you have an echocardiogram in October, 2023. Echocardiography is a painless test that uses sound waves to create images of your heart. It provides your doctor with information about the size and shape of your heart and how well your heart's chambers and valves are working. This procedure takes approximately one hour. There are no restrictions for this procedure. [place] [address]    Follow-Up: At [facility], you and your health needs are our priority.  As part of our continuing mission to provide you with exceptional heart care, we have created designated Provider Care Teams.  These Care Teams include your primary Cardiologist (physician) and Advanced Practice Providers (APPs -  Physician Assistants and Nurse Practitioners) who all work together to  provide you with the care you need, when you need it.  We recommend signing up for the patient portal called "MyChart".  Sign up information is provided on this After Visit Summary.  MyChart is used to connect with patients for Virtual Visits (Telemedicine).  Patients are able to view lab/test results, encounter notes, upcoming appointments, etc.  Non-urgent messages can be sent to your provider as well.   To learn more about what you can do with MyChart, go to [email or web address].    Your next appointment:   4 month(s)  The format for your next appointment:   In Person  Provider:   Donato Heinz, MD {             Signed, Donato Heinz, MD  01/04/2022 3:15 PM    [place]

## 2021-12-30 NOTE — Patient Instructions (Signed)
Medication Instructions:  Your Physician recommend you continue on your current medication as directed.    *If you need a refill on your cardiac medications before your next appointment, please call your pharmacy*   Lab Work: Your physician recommends that you return for lab work today (lipid, Mg, BMP)  If you have labs (blood work) drawn today and your tests are completely normal, you will receive your results only by: MyChart Message (if you have MyChart) OR A paper copy in the mail If you have any lab test that is abnormal or we need to change your treatment, we will call you to review the results.   Testing/Procedures: Your physician has requested that you have an echocardiogram in October, 2023. Echocardiography is a painless test that uses sound waves to create images of your heart. It provides your doctor with information about the size and shape of your heart and how well your heart's chambers and valves are working. This procedure takes approximately one hour. There are no restrictions for this procedure. [place] [address]    Follow-Up: At [facility], you and your health needs are our priority.  As part of our continuing mission to provide you with exceptional heart care, we have created designated Provider Care Teams.  These Care Teams include your primary Cardiologist (physician) and Advanced Practice Providers (APPs -  Physician Assistants and Nurse Practitioners) who all work together to provide you with the care you need, when you need it.  We recommend signing up for the patient portal called "MyChart".  Sign up information is provided on this After Visit Summary.  MyChart is used to connect with patients for Virtual Visits (Telemedicine).  Patients are able to view lab/test results, encounter notes, upcoming appointments, etc.  Non-urgent messages can be sent to your provider as well.   To learn more about what you can do with MyChart, go to  [email or web address].    Your next appointment:   4 month(s)  The format for your next appointment:   In Person  Provider:   Donato Heinz, MD {

## 2021-12-31 ENCOUNTER — Other Ambulatory Visit: Payer: Self-pay | Admitting: *Deleted

## 2021-12-31 DIAGNOSIS — M25372 Other instability, left ankle: Secondary | ICD-10-CM | POA: Diagnosis not present

## 2021-12-31 DIAGNOSIS — I5022 Chronic systolic (congestive) heart failure: Secondary | ICD-10-CM

## 2021-12-31 DIAGNOSIS — Z79899 Other long term (current) drug therapy: Secondary | ICD-10-CM

## 2021-12-31 LAB — BASIC METABOLIC PANEL
BUN/Creatinine Ratio: 18 (ref 12–28)
BUN: 20 mg/dL (ref 8–27)
CO2: 24 mmol/L (ref 20–29)
Calcium: 10.2 mg/dL (ref 8.7–10.3)
Chloride: 105 mmol/L (ref 96–106)
Creatinine, Ser: 1.12 mg/dL — ABNORMAL HIGH (ref 0.57–1.00)
Glucose: 93 mg/dL (ref 70–99)
Potassium: 4.8 mmol/L (ref 3.5–5.2)
Sodium: 143 mmol/L (ref 134–144)
eGFR: 49 mL/min/{1.73_m2} — ABNORMAL LOW (ref >59)

## 2021-12-31 LAB — LIPID PANEL
Chol/HDL Ratio: 2.6 ratio (ref 0.0–4.4)
Cholesterol, Total: 143 mg/dL (ref 100–199)
HDL: 56 mg/dL (ref >39)
LDL Chol Calc (NIH): 72 mg/dL (ref 0–99)
Triglycerides: 77 mg/dL (ref 0–149)
VLDL Cholesterol Cal: 15 mg/dL (ref 5–40)

## 2021-12-31 LAB — MAGNESIUM: Magnesium: 2.1 mg/dL (ref 1.6–2.3)

## 2022-01-07 ENCOUNTER — Ambulatory Visit (HOSPITAL_COMMUNITY)
Admission: RE | Admit: 2022-01-07 | Discharge: 2022-01-07 | Disposition: A | Discharge status: Home / Self Care | Payer: Medicare Other | Source: Ambulatory Visit | Attending: Cardiology | Admitting: Cardiology

## 2022-01-07 DIAGNOSIS — I2510 Atherosclerotic heart disease of native coronary artery without angina pectoris: Secondary | ICD-10-CM | POA: Insufficient documentation

## 2022-01-26 ENCOUNTER — Telehealth: Payer: Self-pay | Admitting: Cardiology

## 2022-01-26 DIAGNOSIS — M1811 Unilateral primary osteoarthritis of first carpometacarpal joint, right hand: Secondary | ICD-10-CM | POA: Diagnosis not present

## 2022-01-26 DIAGNOSIS — M79641 Pain in right hand: Secondary | ICD-10-CM | POA: Diagnosis not present

## 2022-01-26 NOTE — Telephone Encounter (Signed)
Dr. Gardiner Rhyme, you recently saw this patient on 12/30/21 who is now pending clearance for right thumb trapeziectomy and supensionplasty. Would you please comment on your assessment of her cardiac risk for surgery and may she hold aspirin prior to procedure?   Please route your response to p cv div preop.  Thank you, Emmaline Life, NP-C    01/26/2022, 4:56 PM [place] [address], [address] Office [phone] Fax [phone]

## 2022-01-26 NOTE — Telephone Encounter (Signed)
   Pre-operative Risk Assessment    Patient Name: Claudia Ortiz  DOB: March 27, 1941 MRN: [id number]      Request for Surgical Clearance    Procedure:   Right Thumb Trapeziectomy and Supensionplasty  Date of Surgery:  02-18-22  Clearance                                  Surgeon:  Dr Leanora Cover Surgeon's Group or Practice Name:   Phone number:  [phone] Fax number:  [phone]   Type of Clearance Requested:   - Medical  - Pharmacy:  Hold Aspirin     Type of Anesthesia:   Choice   Additional requests/questions:    Lorin Glass   01/26/2022, 4:24 PM

## 2022-01-27 ENCOUNTER — Other Ambulatory Visit: Payer: Self-pay | Admitting: Orthopedic Surgery

## 2022-01-27 NOTE — Telephone Encounter (Signed)
Negative stress test last year, no further workup recommended prior to surgery.  OK to hold aspirin

## 2022-01-27 NOTE — Telephone Encounter (Signed)
   Primary Cardiologist: Donato Heinz, MD  Chart reviewed as part of pre-operative protocol coverage. Given past medical history and time since last visit, based on ACC/AHA guidelines, Claudia Ortiz would be at acceptable risk for the planned procedure without further cardiovascular testing.   She may hold her aspirin for 5-7 days prior to surgery and should resume it as soon as hemodynamically stable.  I will route this recommendation to the requesting party via Epic fax function and remove from pre-op pool.  Please call with questions.  Emmaline Life, NP-C    01/27/2022, 4:34 PM [place] [address], [address] Office [phone] Fax [phone]

## 2022-02-01 ENCOUNTER — Encounter: Payer: Self-pay | Admitting: *Deleted

## 2022-02-08 ENCOUNTER — Ambulatory Visit: Payer: Medicare Other | Attending: General Surgery | Admitting: Rehabilitation

## 2022-02-08 ENCOUNTER — Encounter: Payer: Self-pay | Admitting: Rehabilitation

## 2022-02-08 DIAGNOSIS — Z170 Estrogen receptor positive status [ER+]: Secondary | ICD-10-CM | POA: Insufficient documentation

## 2022-02-08 DIAGNOSIS — M25611 Stiffness of right shoulder, not elsewhere classified: Secondary | ICD-10-CM | POA: Insufficient documentation

## 2022-02-08 DIAGNOSIS — C50511 Malignant neoplasm of lower-outer quadrant of right female breast: Secondary | ICD-10-CM | POA: Insufficient documentation

## 2022-02-08 DIAGNOSIS — Z483 Aftercare following surgery for neoplasm: Secondary | ICD-10-CM | POA: Insufficient documentation

## 2022-02-08 DIAGNOSIS — R293 Abnormal posture: Secondary | ICD-10-CM | POA: Insufficient documentation

## 2022-02-08 NOTE — Therapy (Signed)
OUTPATIENT PHYSICAL THERAPY SOZO SCREENING NOTE   Patient Name: Claudia Ortiz MRN: [id number] DOB:[birth year], 81 y.o., female Today's Date: 02/08/2022  PCP: Kelton Pillar, MD REFERRING PROVIDER: Rolm Bookbinder, MD   PT End of Session - 02/08/22 1057     Visit Number 2    Number of Visits 2    Date for PT Re-Evaluation 06/29/21    PT Start Time 1100    PT Stop Time 1110    PT Time Calculation (min) 10 min    Activity Tolerance Patient tolerated treatment well    Behavior During Therapy [facility] for tasks assessed/performed             Past Medical History:  Diagnosis Date   Anxiety    Arthritis 2019   Asthma    As Child   Cancer ([place]) 05/2020   R breast   Colon polyp    Elevated cholesterol    Intraductal papilloma of left breast     precancerous cells in R breast   Tricuspid valve mass    on 11/21/20 echo   Past Surgical History:  Procedure Laterality Date   AXILLARY SENTINEL NODE BIOPSY Right 05/12/2021   Procedure: AXILLARY SENTINEL NODE BIOPSY WITH MAGTRACE;  Surgeon: Rolm Bookbinder, MD;  Location: [place];  Service: General;  Laterality: Right;   BREAST LUMPECTOMY WITH RADIOACTIVE SEED AND SENTINEL LYMPH NODE BIOPSY Right 05/12/2021   Procedure: RIGHT BREAST LUMPECTOMY WITH RADIOACTIVE SEED X3;  Surgeon: Rolm Bookbinder, MD;  Location: [place];  Service: General;  Laterality: Right;   colon polyp     removed   CYST REMOVAL TRUNK     breast    EYE SURGERY Left    cataract removal   FOOT SURGERY     PORTACATH PLACEMENT Left 09/02/2020   Procedure: INSERTION PORT-A-CATH;  Surgeon: Rolm Bookbinder, MD;  Location: [place];  Service: General;  Laterality: Left;   TEE WITHOUT CARDIOVERSION N/A 01/01/2021   Procedure: TRANSESOPHAGEAL ECHOCARDIOGRAM (TEE);  Surgeon: Elouise Munroe, MD;  Location: [place];  Service: Cardiology;  Laterality: N/A;   TONSILLECTOMY     as a child   Patient Active Problem List   Diagnosis Date  Noted   HFrEF (heart failure with reduced ejection fraction) ([place]) 07/03/2021   Tricuspid valve mass    Port-A-Cath in place 09/10/2020   Malignant neoplasm of lower-outer quadrant of right breast of female, estrogen receptor positive ([place]) 07/14/2020   Dyspnea 02/18/2014   Fatigue 02/18/2014   Abnormal ECG 02/18/2014   History of left breast cancer 02/18/2014   Hyperlipidemia 02/18/2014    REFERRING DIAG: right breast cancer at risk for lymphedema  THERAPY DIAG:  Aftercare following surgery for neoplasm  Malignant neoplasm of lower-outer quadrant of right breast of female, estrogen receptor positive ([place])  Abnormal posture  Stiffness of right shoulder, not elsewhere classified  PERTINENT HISTORY: Patient was diagnosed on 06/17/2020 with right grade III invasive ductal carcinoma breast cancer. She underwent neoadjuvant chemotherapy 09/03/2020 - 11/19/2020 followed by a right lumpectomy and sentinel node biopsy (3 negative nodes) on 10/25/202. It is ER positive, PR negative and HER2 positive with a Ki67 of 40%.   PRECAUTIONS: right UE Lymphedema risk  SUBJECTIVE: Here for SOZO screen; doing well. I am in a boot now.   PAIN:  Are you having pain? No  SOZO SCREENING: Patient was assessed today using the SOZO machine to determine the lymphedema index score. This was compared to her baseline score. It was  determined that she is within the recommended range when compared to her baseline and no further action is needed at this time. She will continue SOZO screenings. These are done every 3 months for 2 years post operatively followed by every 6 months for 2 years, and then annually.     Shan Levans, PT  02/08/22 11:08 AM

## 2022-02-09 ENCOUNTER — Ambulatory Visit
Admission: RE | Admit: 2022-02-09 | Discharge: 2022-02-09 | Disposition: A | Discharge status: Home / Self Care | Payer: Medicare Other | Source: Ambulatory Visit | Attending: Adult Health | Admitting: Adult Health

## 2022-02-09 ENCOUNTER — Telehealth: Payer: Self-pay | Admitting: Surgery

## 2022-02-09 DIAGNOSIS — Z780 Asymptomatic menopausal state: Secondary | ICD-10-CM | POA: Diagnosis not present

## 2022-02-09 DIAGNOSIS — E2839 Other primary ovarian failure: Secondary | ICD-10-CM

## 2022-02-09 NOTE — Telephone Encounter (Signed)
Per Wilber Bihari, NP, I called the pt to discuss her bone density results.  The pt's bone density scan was normal, and pt verbalized understanding of these results.  I verified her upcoming appointment date and time on August 10 with Dr. Lindi Adie, and I told her to call our office if she had any questions or concerns.

## 2022-02-24 NOTE — Assessment & Plan Note (Signed)
07/14/2020:Screening mammogram showed a 1.5cm right breast mass. US showed the 1.4cm mass at the 7 o'clock position, a 2.7cm mass at the 9 o'clock position, and benign right axillary lymph nodes. Biopsy showed ALH at the 9 o'clock position, and at the 7 o'clock position, IDC, grade 3, HER-2 positive (3+), ER+ 20% weak, PR- 0%, Ki67 40%.  Treatment Plan: 1. Neoadjuvant chemotherapy withTaxol Herceptinfollowed by Herceptin maintenance for 1 year 2. 05/12/2021: Pathologic complete response  Right lumpectomy: LCIS involving CSL, intraductal papilloma negative for IDC or DCIS Right lateral margin lumpectomy: LCIS, ADH Lymph nodes: 3 negative 3. Followed by adjuvant radiation therapy if patient had lumpectomy 4.Followed by adjuvant antiestrogen therapy ------------------------------------------------------------------------------------------------------------------------------- Current treatment: Herceptin maintenance stopped for low ejection fraction Treatment plan: Adjuvant radiation followed by adjuvant antiestrogen therapy Echocardiogram 05/26/2019: EF 40%  Coronary calcium score: 150

## 2022-02-25 ENCOUNTER — Inpatient Hospital Stay: Payer: Medicare Other | Attending: Hematology and Oncology | Admitting: Hematology and Oncology

## 2022-02-25 ENCOUNTER — Other Ambulatory Visit: Payer: Self-pay

## 2022-02-25 DIAGNOSIS — Z79899 Other long term (current) drug therapy: Secondary | ICD-10-CM | POA: Insufficient documentation

## 2022-02-25 DIAGNOSIS — Z888 Allergy status to other drugs, medicaments and biological substances status: Secondary | ICD-10-CM | POA: Insufficient documentation

## 2022-02-25 DIAGNOSIS — Z170 Estrogen receptor positive status [ER+]: Secondary | ICD-10-CM | POA: Insufficient documentation

## 2022-02-25 DIAGNOSIS — C50511 Malignant neoplasm of lower-outer quadrant of right female breast: Secondary | ICD-10-CM | POA: Diagnosis not present

## 2022-02-25 DIAGNOSIS — Z79811 Long term (current) use of aromatase inhibitors: Secondary | ICD-10-CM | POA: Insufficient documentation

## 2022-02-25 DIAGNOSIS — Z881 Allergy status to other antibiotic agents status: Secondary | ICD-10-CM | POA: Insufficient documentation

## 2022-02-25 DIAGNOSIS — Z880 Allergy status to penicillin: Secondary | ICD-10-CM | POA: Diagnosis not present

## 2022-02-25 NOTE — Progress Notes (Signed)
Patient Care Team: Kelton Pillar, MD as PCP - General (Family Medicine) Donato Heinz, MD as PCP - Cardiology (Cardiology) Rolm Bookbinder, MD as Consulting Physician (General Surgery) Nicholas Lose, MD as Consulting Physician (Hematology and Oncology) Kyung Rudd, MD as Consulting Physician (Radiation Oncology) Donato Heinz, MD as Consulting Physician (Cardiology)  DIAGNOSIS:  Encounter Diagnosis  Name Primary?   Malignant neoplasm of lower-outer quadrant of right breast of female, estrogen receptor positive ([place])     SUMMARY OF ONCOLOGIC HISTORY: Oncology History  Malignant neoplasm of lower-outer quadrant of right breast of female, estrogen receptor positive ([place])  07/14/2020 Initial Diagnosis   Screening mammogram showed a 1.5cm right breast mass. US showed the 1.4cm mass at the 7 o'clock position, a 2.7cm mass at the 9 o'clock position, and benign right axillary lymph nodes. Biopsy showed ALH at the 9 o'clock position, and at the 7 o'clock position, IDC, grade 3, HER-2 positive (3+), ER+ 20% weak, PR- 0%, Ki67 40%.   09/03/2020 - 11/19/2020 Neo-Adjuvant Chemotherapy   Weekly Taxol (dose reduced) and Herceptin x 12   12/10/2020 - 03/25/2021 Chemotherapy   Patient is on Treatment Plan : BREAST Trastuzumab q21d      05/12/2021 Surgery   Right lumpectomy: LCIS involving CSL, intraductal papilloma negative for IDC or DCIS Right lateral margin lumpectomy: LCIS, ADH Lymph nodes: 3 negative    08/2021 -  Anti-estrogen oral therapy   Anastrozole daily     CHIEF COMPLIANT: Follow-up on anastrozole therapy  INTERVAL HISTORY: Claudia Ortiz is a 81 year old with above-mentioned history of right breast cancer who was treated with neoadjuvant chemotherapy with Taxol Herceptin followed by Herceptin maintenance.  Maintenance Herceptin was discontinued because of low ejection fraction.  She is currently on anastrozole therapy.  She appears to be tolerating it  fairly well.  She does have pain in her right base of thumb for which she is planning to undergo surgery because of pain and swelling and osteoarthritis.  There may also need surgery on her left foot as well.   ALLERGIES:  is allergic to iodine, shellfish allergy, atorvastatin, cephalosporins, and penicillins.  MEDICATIONS:  Current Outpatient Medications  Medication Sig Dispense Refill   anastrozole (ARIMIDEX) 1 MG tablet Take 1 tablet (1 mg total) by mouth daily. 90 tablet 3   Apoaequorin (PREVAGEN) 10 MG CAPS Take 10 mg by mouth daily.     aspirin EC 81 MG tablet Take 1 tablet (81 mg total) by mouth daily. Swallow whole. 90 tablet 3   b complex vitamins capsule Take 1 capsule by mouth daily.     Bioflavonoid Products (SUPER C-500 COMPLEX PO) Take 1 tablet by mouth daily.     Biotin 5000 MCG CAPS Take 5,000 mcg by mouth daily.     cholecalciferol (VITAMIN D3) 25 MCG (1000 UNIT) tablet Take 1,000 Units by mouth daily.     dapagliflozin propanediol (FARXIGA) 10 MG TABS tablet Take 1 tablet (10 mg total) by mouth daily before breakfast. 90 tablet 3   Evening Primrose Oil 500 MG CAPS Take 500 mg by mouth daily.     Garlic 8850 MG CAPS Take 1,000 mg by mouth daily.     losartan (COZAAR) 25 MG tablet Take 1 tablet (25 mg total) by mouth daily. 90 tablet 3   metoprolol succinate (TOPROL XL) 25 MG 24 hr tablet Take 1.5 tablets (37.5 mg total) by mouth daily. 135 tablet 3   Multiple Vitamin (MULTIVITAMIN) tablet Take 1 tablet by mouth daily.  PFIZER-BIONT COVID-19 VAC-TRIS SUSP injection      rosuvastatin (CRESTOR) 10 MG tablet Take 1 tablet (10 mg total) by mouth every other day. 30 tablet 6   spironolactone (ALDACTONE) 25 MG tablet Take 0.5 tablets (12.5 mg total) by mouth daily. 45 tablet 3   Vitamin A 7.5 MG (25000 UT) CAPS Take 25,000 Units by mouth daily.     No current facility-administered medications for this visit.    PHYSICAL EXAMINATION: ECOG PERFORMANCE STATUS: 1 - Symptomatic  but completely ambulatory  Vitals:   02/25/22 1033  BP: 116/71  Pulse: 89  Resp: 18  Temp: (!) 97.3 F (36.3 C)  SpO2: 96%   Filed Weights   02/25/22 1033  Weight: 191 lb 6.4 oz (86.8 kg)    BREAST: No palpable masses or nodules in either right or left breasts. No palpable axillary supraclavicular or infraclavicular adenopathy no breast tenderness or nipple discharge. (exam performed in the presence of a chaperone)  LABORATORY DATA:  I have reviewed the data as listed    Latest Ref Rng & Units 12/30/2021    4:53 PM 09/11/2021    3:35 PM 07/28/2021   11:17 AM  CMP  Glucose 70 - 99 mg/dL 93  95  121   BUN 8 - 27 mg/dL $Remove'20  15  17   'bcPwZdf$ Creatinine 0.57 - 1.00 mg/dL 1.12  0.86  0.83   Sodium 134 - 144 mmol/L 143  141  141   Potassium 3.5 - 5.2 mmol/L 4.8  4.6  4.2   Chloride 96 - 106 mmol/L 105  103  105   CO2 20 - 29 mmol/L $RemoveB'24  23  25   'mxDWnaMZ$ Calcium 8.7 - 10.3 mg/dL 10.2  9.9  9.6     Lab Results  Component Value Date   WBC 5.9 05/27/2021   HGB 12.1 05/27/2021   HCT 37.3 05/27/2021   MCV 81.1 05/27/2021   PLT 208 05/27/2021   NEUTROABS 3.2 05/27/2021    ASSESSMENT & PLAN:  Malignant neoplasm of lower-outer quadrant of right breast of female, estrogen receptor positive ([place]) 07/14/2020:Screening mammogram showed a 1.5cm right breast mass. US showed the 1.4cm mass at the 7 o'clock position, a 2.7cm mass at the 9 o'clock position, and benign right axillary lymph nodes. Biopsy showed ALH at the 9 o'clock position, and at the 7 o'clock position, IDC, grade 3, HER-2 positive (3+), ER+ 20% weak, PR- 0%, Ki67 40%.   Treatment Plan: 1. Neoadjuvant chemotherapy with Taxol Herceptin followed by Herceptin maintenance for 1 year completed 03/25/2021.  Stopped for low EF 2. 05/12/2021: Pathologic complete response  Right lumpectomy: LCIS involving CSL, intraductal papilloma negative for IDC or DCIS Right lateral margin lumpectomy: LCIS, ADH Lymph nodes: 3 negative 3. Followed by adjuvant  radiation therapy if patient had lumpectomy 4.  Followed by adjuvant antiestrogen therapy ------------------------------------------------------------------------------------------------------------------------------- Current treatment:  Anastrozole 1 mg daily Anastrozole toxicities: Denies any major problems or concerns.  Pain in the right base of thumb planning to get surgery for that. She will also need surgery on her left ankle.  Breast cancer surveillance: 1.  Breast exam 02/25/2022: Benign 2. mammogram 10/01/2021: The noted that the ribbon shaped clip was still in the breast at the site of her previous breast cancer.  Bone density 02/09/2022: T-score -0.6: Normal Coronary calcium score: 150  She is very concerned about her husband's health as well.  He has gotten weaker.  Return to clinic in 1 year for follow-up  Orders Placed This Encounter  Procedures   MM DIAG BREAST TOMO BILATERAL    Standing Status:   Future    Standing Expiration Date:   02/26/2023    Order Specific Question:   Reason for Exam (SYMPTOM  OR DIAGNOSIS REQUIRED)    Answer:   annual mammograms with History of breast cancer    Order Specific Question:   Preferred imaging location?    Answer:   [facility]    Order Specific Question:   Release to patient    Answer:   Immediate   The patient has a good understanding of the overall plan. she agrees with it. she will call with any problems that may develop before the next visit here. Total time spent: 30 mins including face to face time and time spent for planning, charting and co-ordination of care   Harriette Ohara, MD 02/25/22

## 2022-03-03 DIAGNOSIS — M76822 Posterior tibial tendinitis, left leg: Secondary | ICD-10-CM | POA: Diagnosis not present

## 2022-03-04 ENCOUNTER — Encounter (HOSPITAL_BASED_OUTPATIENT_CLINIC_OR_DEPARTMENT_OTHER): Payer: Self-pay | Admitting: Orthopedic Surgery

## 2022-03-04 ENCOUNTER — Other Ambulatory Visit: Payer: Self-pay

## 2022-03-10 NOTE — Anesthesia Preprocedure Evaluation (Signed)
Anesthesia Evaluation    Reviewed: Allergy & Precautions, Patient's Chart, lab work & pertinent test results, reviewed documented beta blocker date and time   Airway Mallampati: II  TM Distance: >3 FB Neck ROM: Full    Dental no notable dental hx. (+) Teeth Intact, Dental Advisory Given   Pulmonary asthma ,    Pulmonary exam normal breath sounds clear to auscultation       Cardiovascular negative cardio ROS Normal cardiovascular exam Rhythm:Regular Rate:Normal  EKG 04/17/2021: Normal sinus rhythm.  Rate 68. Left axis deviation. Inferior infarct , age undetermined.  No significant change.  MR cardiac stress test 04/17/2021: IMPRESSION: 1. Mobile mass attached to the septal leaflet of the tricuspid valve measuring 19m x 754m Mass is isointense to myocardium on T1 weighted imaging, does not suppress with fat suppression, hyperintense to myocardium on T2 weighted imaging, and demonstrates homogenous hyperenhancement on LGE imaging. This is consistent with a papillary fibroelastoma  2. No evidence of ischemia on stress perfusion imaging  3. No myocardial late gadolinium enhancement to suggest myocardial scar  4. Normal LV size, mild hypertrophy, and mild systolic dysfunction (EF 4355% 5. Normal RV size and systolic function (EF 5337% TTE 04/07/2021: 1. Left ventricular ejection fraction, by estimation, is 35 to 40%. The  left ventricle has moderately decreased function. The left ventricle  demonstrates global hypokinesis. The left ventricular internal cavity size  was mildly dilated. Left ventricular  diastolic parameters are consistent with Grade I diastolic dysfunction  (impaired relaxation).  2. Right ventricular systolic function is normal. The right ventricular  size is normal.  3. Left atrial size was mildly dilated.  4. Right atrial size was mildly dilated.  5. The mitral valve is normal in structure. Mild  mitral valve  regurgitation. No evidence of mitral stenosis.  6. There is a septal leaflet tricuspid valve mobile circular mass (0.9 x  0.8 cm) on the atrial surface.  7. The aortic valve is normal in structure. Aortic valve regurgitation is  not visualized. No aortic stenosis is present.  8. The inferior vena cava is normal in size with greater than 50%  respiratory variability, suggesting right atrial pressure of 3 mmHg.   Comparison(s): No significant change from prior study. Prior images  reviewed side by side. Tricuspid valve mass remains unchanged (TEE  viewed).    Neuro/Psych PSYCHIATRIC DISORDERS Anxiety negative neurological ROS     GI/Hepatic negative GI ROS, Neg liver ROS,   Endo/Other  negative endocrine ROS  Renal/GU negative Renal ROS  negative genitourinary   Musculoskeletal  (+) Arthritis , Osteoarthritis,    Abdominal   Peds  Hematology  (+) Blood dyscrasia (on eliquis), , REFUSES BLOOD PRODUCTS (will accept albumin),   Anesthesia Other Findings   Reproductive/Obstetrics                             Anesthesia Physical  Anesthesia Plan  ASA: 2  Anesthesia Plan: Regional and MAC   Post-op Pain Management: Minimal or no pain anticipated and Regional block*   Induction: Intravenous  PONV Risk Score and Plan: 3 and Treatment may vary due to age or medical condition and Propofol infusion  Airway Management Planned: Natural Airway and Nasal Cannula  Additional Equipment: None  Intra-op Plan:   Post-operative Plan:   Informed Consent: I have reviewed the patients History and Physical, chart, labs and discussed the procedure including the risks, benefits and alternatives for the  proposed anesthesia with the patient or authorized representative who has indicated his/her understanding and acceptance.     Dental advisory given  Plan Discussed with: CRNA and Anesthesiologist  Anesthesia Plan Comments: ()         Anesthesia Quick Evaluation

## 2022-03-11 ENCOUNTER — Ambulatory Visit (HOSPITAL_BASED_OUTPATIENT_CLINIC_OR_DEPARTMENT_OTHER)
Admission: RE | Admit: 2022-03-11 | Discharge: 2022-03-11 | Disposition: A | Discharge status: Home / Self Care | Payer: Medicare Other | Attending: Orthopedic Surgery | Admitting: Orthopedic Surgery

## 2022-03-11 ENCOUNTER — Ambulatory Visit (HOSPITAL_BASED_OUTPATIENT_CLINIC_OR_DEPARTMENT_OTHER): Payer: Medicare Other | Admitting: Anesthesiology

## 2022-03-11 ENCOUNTER — Encounter (HOSPITAL_BASED_OUTPATIENT_CLINIC_OR_DEPARTMENT_OTHER)
Admission: RE | Disposition: A | Discharge status: Home / Self Care | Payer: Self-pay | Source: Home / Self Care | Attending: Orthopedic Surgery

## 2022-03-11 ENCOUNTER — Encounter (HOSPITAL_BASED_OUTPATIENT_CLINIC_OR_DEPARTMENT_OTHER): Payer: Self-pay | Admitting: Orthopedic Surgery

## 2022-03-11 ENCOUNTER — Ambulatory Visit (HOSPITAL_BASED_OUTPATIENT_CLINIC_OR_DEPARTMENT_OTHER): Payer: Medicare Other

## 2022-03-11 DIAGNOSIS — J45909 Unspecified asthma, uncomplicated: Secondary | ICD-10-CM | POA: Diagnosis not present

## 2022-03-11 DIAGNOSIS — F419 Anxiety disorder, unspecified: Secondary | ICD-10-CM | POA: Diagnosis not present

## 2022-03-11 DIAGNOSIS — M1811 Unilateral primary osteoarthritis of first carpometacarpal joint, right hand: Secondary | ICD-10-CM | POA: Diagnosis not present

## 2022-03-11 DIAGNOSIS — Z01818 Encounter for other preprocedural examination: Secondary | ICD-10-CM

## 2022-03-11 HISTORY — PX: CARPOMETACARPEL SUSPENSION PLASTY: SHX5005

## 2022-03-11 HISTORY — PX: TENDON TRANSFER: SHX6109

## 2022-03-11 SURGERY — CARPOMETACARPEL (CMC) SUSPENSION PLASTY
Anesthesia: Monitor Anesthesia Care | Site: Thumb | Laterality: Right

## 2022-03-11 MED ORDER — FENTANYL CITRATE (PF) 100 MCG/2ML IJ SOLN
INTRAMUSCULAR | Status: AC
  Filled 2022-03-11: qty 2

## 2022-03-11 MED ORDER — PROPOFOL 10 MG/ML IV BOLUS
INTRAVENOUS | Status: DC | PRN
  Administered 2022-03-11: 20 mg via INTRAVENOUS
  Administered 2022-03-11: 10 mg via INTRAVENOUS

## 2022-03-11 MED ORDER — PROPOFOL 500 MG/50ML IV EMUL
INTRAVENOUS | Status: AC
  Filled 2022-03-11: qty 50

## 2022-03-11 MED ORDER — ONDANSETRON HCL 4 MG/2ML IJ SOLN
INTRAMUSCULAR | Status: AC
  Filled 2022-03-11: qty 2

## 2022-03-11 MED ORDER — FENTANYL CITRATE (PF) 100 MCG/2ML IJ SOLN
100.0000 ug | Freq: Once | INTRAMUSCULAR | Status: AC
  Administered 2022-03-11: 100 ug via INTRAVENOUS

## 2022-03-11 MED ORDER — LACTATED RINGERS IV SOLN: INTRAVENOUS | Status: DC

## 2022-03-11 MED ORDER — ACETAMINOPHEN 160 MG/5ML PO SOLN: 325.0000 mg | ORAL | Status: DC | PRN

## 2022-03-11 MED ORDER — PROPOFOL 500 MG/50ML IV EMUL
INTRAVENOUS | Status: DC | PRN
  Administered 2022-03-11: 75 ug/kg/min via INTRAVENOUS

## 2022-03-11 MED ORDER — ACETAMINOPHEN 325 MG PO TABS: 325.0000 mg | ORAL_TABLET | ORAL | Status: DC | PRN

## 2022-03-11 MED ORDER — MEPERIDINE HCL 25 MG/ML IJ SOLN: 6.2500 mg | INTRAMUSCULAR | Status: DC | PRN

## 2022-03-11 MED ORDER — OXYCODONE HCL 5 MG/5ML PO SOLN: 5.0000 mg | Freq: Once | ORAL | Status: DC | PRN

## 2022-03-11 MED ORDER — 0.9 % SODIUM CHLORIDE (POUR BTL) OPTIME
TOPICAL | Status: DC | PRN
  Administered 2022-03-11: 60 mL

## 2022-03-11 MED ORDER — FENTANYL CITRATE (PF) 100 MCG/2ML IJ SOLN: 25.0000 ug | INTRAMUSCULAR | Status: DC | PRN

## 2022-03-11 MED ORDER — PROPOFOL 10 MG/ML IV BOLUS
INTRAVENOUS | Status: AC
  Filled 2022-03-11: qty 20

## 2022-03-11 MED ORDER — ONDANSETRON HCL 4 MG/2ML IJ SOLN: 4.0000 mg | Freq: Once | INTRAMUSCULAR | Status: DC | PRN

## 2022-03-11 MED ORDER — BUPIVACAINE-EPINEPHRINE (PF) 0.25% -1:200000 IJ SOLN
INTRAMUSCULAR | Status: AC
  Filled 2022-03-11: qty 30

## 2022-03-11 MED ORDER — VANCOMYCIN HCL IN DEXTROSE 1-5 GM/200ML-% IV SOLN
1000.0000 mg | INTRAVENOUS | Status: AC
  Administered 2022-03-11: 1000 mg via INTRAVENOUS

## 2022-03-11 MED ORDER — OXYCODONE-ACETAMINOPHEN 5-325 MG PO TABS: ORAL_TABLET | ORAL | 0 refills | Status: DC

## 2022-03-11 MED ORDER — VANCOMYCIN HCL IN DEXTROSE 1-5 GM/200ML-% IV SOLN
INTRAVENOUS | Status: AC
  Filled 2022-03-11: qty 200

## 2022-03-11 MED ORDER — ROPIVACAINE HCL 5 MG/ML IJ SOLN
INTRAMUSCULAR | Status: DC | PRN
  Administered 2022-03-11: 20 mL via PERINEURAL

## 2022-03-11 MED ORDER — MIDAZOLAM HCL 2 MG/2ML IJ SOLN
1.0000 mg | Freq: Once | INTRAMUSCULAR | Status: AC
  Administered 2022-03-11: 1 mg via INTRAVENOUS

## 2022-03-11 MED ORDER — MIDAZOLAM HCL 2 MG/2ML IJ SOLN
INTRAMUSCULAR | Status: AC
  Filled 2022-03-11: qty 2

## 2022-03-11 MED ORDER — OXYCODONE HCL 5 MG PO TABS: 5.0000 mg | ORAL_TABLET | Freq: Once | ORAL | Status: DC | PRN

## 2022-03-11 SURGICAL SUPPLY — 93 items
APL PRP STRL LF DISP 70% ISPRP (MISCELLANEOUS) ×2
APL SKNCLS STERI-STRIP NONHPOA (GAUZE/BANDAGES/DRESSINGS) ×2
BAG DECANTER FOR FLEXI CONT (MISCELLANEOUS) IMPLANT
BALL CTTN LRG ABS STRL LF (GAUZE/BANDAGES/DRESSINGS)
BENZOIN TINCTURE PRP APPL 2/3 (GAUZE/BANDAGES/DRESSINGS) IMPLANT
BIT DRILL PASSING CMC 1/4 FLEX (BIT) IMPLANT
BLADE MINI RND TIP GREEN BEAV (BLADE) ×2 IMPLANT
BLADE SURG 15 STRL LF DISP TIS (BLADE) ×4 IMPLANT
BLADE SURG 15 STRL SS (BLADE) ×4
BNDG CMPR 9X4 STRL LF SNTH (GAUZE/BANDAGES/DRESSINGS) ×2
BNDG ELASTIC 2X5.8 VLCR STR LF (GAUZE/BANDAGES/DRESSINGS) IMPLANT
BNDG ELASTIC 3X5.8 VLCR STR LF (GAUZE/BANDAGES/DRESSINGS) ×2 IMPLANT
BNDG ESMARK 4X9 LF (GAUZE/BANDAGES/DRESSINGS) ×2 IMPLANT
BNDG GAUZE DERMACEA FLUFF (GAUZE/BANDAGES/DRESSINGS) ×2
BNDG GAUZE DERMACEA FLUFF 4 (GAUZE/BANDAGES/DRESSINGS) ×2 IMPLANT
BNDG GZE DERMACEA 4 6PLY (GAUZE/BANDAGES/DRESSINGS) ×2
BUTTON ALL-SUT W/BACKSTOP (Orthopedic Implant) IMPLANT
CHLORAPREP W/TINT 26 (MISCELLANEOUS) ×2 IMPLANT
CORD BIPOLAR FORCEPS 12FT (ELECTRODE) ×2 IMPLANT
COTTONBALL LRG STERILE PKG (GAUZE/BANDAGES/DRESSINGS) IMPLANT
COVER BACK TABLE 60X90IN (DRAPES) ×2 IMPLANT
COVER MAYO STAND STRL (DRAPES) ×2 IMPLANT
CUFF TOURN SGL QUICK 18X4 (TOURNIQUET CUFF) ×2 IMPLANT
DRAPE EXTREMITY T 121X128X90 (DISPOSABLE) ×2 IMPLANT
DRAPE OEC MINIVIEW 54X84 (DRAPES) ×2 IMPLANT
DRAPE SURG 17X23 STRL (DRAPES) ×2 IMPLANT
DRILL PASSING CMC 1/4 FLEX (BIT) ×2
DRSG PAD ABDOMINAL 8X10 ST (GAUZE/BANDAGES/DRESSINGS) IMPLANT
GAUZE 4X4 16PLY ~~LOC~~+RFID DBL (SPONGE) IMPLANT
GAUZE SPONGE 4X4 12PLY STRL (GAUZE/BANDAGES/DRESSINGS) ×2 IMPLANT
GAUZE XEROFORM 1X8 LF (GAUZE/BANDAGES/DRESSINGS) ×2 IMPLANT
GLOVE BIO SURGEON STRL SZ7.5 (GLOVE) ×2 IMPLANT
GLOVE BIOGEL PI IND STRL 8 (GLOVE) ×2 IMPLANT
GLOVE BIOGEL PI IND STRL 8.5 (GLOVE) IMPLANT
GLOVE BIOGEL PI INDICATOR 8 (GLOVE) ×2
GLOVE BIOGEL PI INDICATOR 8.5 (GLOVE) ×2
GLOVE SURG ORTHO 8.0 STRL STRW (GLOVE) IMPLANT
GOWN STRL REUS W/ TWL LRG LVL3 (GOWN DISPOSABLE) ×2 IMPLANT
GOWN STRL REUS W/TWL LRG LVL3 (GOWN DISPOSABLE) ×2
GOWN STRL REUS W/TWL XL LVL3 (GOWN DISPOSABLE) ×2 IMPLANT
LOOP VESSEL MAXI BLUE (MISCELLANEOUS) IMPLANT
NDL HYPO 25X1 1.5 SAFETY (NEEDLE) IMPLANT
NDL KEITH (NEEDLE) IMPLANT
NDL SAFETY ECLIP 18X1.5 (MISCELLANEOUS) IMPLANT
NEEDLE HYPO 25X1 1.5 SAFETY (NEEDLE) IMPLANT
NEEDLE KEITH (NEEDLE) IMPLANT
NS IRRIG 1000ML POUR BTL (IV SOLUTION) ×2 IMPLANT
PACK BASIN DAY SURGERY FS (CUSTOM PROCEDURE TRAY) ×2 IMPLANT
PAD CAST 3X4 CTTN HI CHSV (CAST SUPPLIES) ×2 IMPLANT
PAD CAST 4YDX4 CTTN HI CHSV (CAST SUPPLIES) IMPLANT
PADDING CAST ABS COTTON 3X4 (CAST SUPPLIES) IMPLANT
PADDING CAST ABS COTTON 4X4 ST (CAST SUPPLIES) ×2 IMPLANT
PADDING CAST COTTON 3X4 STRL (CAST SUPPLIES) ×2
PADDING CAST COTTON 4X4 STRL (CAST SUPPLIES)
SLEEVE SCD COMPRESS KNEE MED (STOCKING) IMPLANT
SLING ARM FOAM STRAP LRG (SOFTGOODS) IMPLANT
SPIKE FLUID TRANSFER (MISCELLANEOUS) IMPLANT
SPLINT FAST PLASTER 5X30 (CAST SUPPLIES)
SPLINT PLASTER CAST FAST 5X30 (CAST SUPPLIES) IMPLANT
SPLINT PLASTER CAST XFAST 3X15 (CAST SUPPLIES) IMPLANT
SPLINT PLASTER CAST XFAST 4X15 (CAST SUPPLIES) IMPLANT
SPLINT PLASTER XTRA FAST SET 4 (CAST SUPPLIES)
SPLINT PLASTER XTRA FASTSET 3X (CAST SUPPLIES)
STOCKINETTE 4X48 STRL (DRAPES) ×2 IMPLANT
STRIP CLOSURE SKIN 1/2X4 (GAUZE/BANDAGES/DRESSINGS) IMPLANT
SUT CHROMIC 5 0 P 3 (SUTURE) IMPLANT
SUT ETHIBOND 3-0 V-5 (SUTURE) IMPLANT
SUT ETHILON 3 0 PS 1 (SUTURE) IMPLANT
SUT ETHILON 4 0 PS 2 18 (SUTURE) ×2 IMPLANT
SUT FIBERWIRE 2-0 18 17.9 3/8 (SUTURE) ×2
SUT FIBERWIRE 3-0 18 TAPR NDL (SUTURE)
SUT FIBERWIRE 4-0 18 DIAM BLUE (SUTURE)
SUT MERSILENE 2.0 SH NDLE (SUTURE) IMPLANT
SUT MERSILENE 4 0 P 3 (SUTURE) IMPLANT
SUT MNCRL AB 4-0 PS2 18 (SUTURE) IMPLANT
SUT PROLENE 2 0 SH DA (SUTURE) IMPLANT
SUT PROLENE 6 0 P 1 18 (SUTURE) IMPLANT
SUT SILK 2 0 PERMA HAND 18 BK (SUTURE) IMPLANT
SUT SILK 4 0 PS 2 (SUTURE) IMPLANT
SUT VIC AB 3-0 PS1 18 (SUTURE)
SUT VIC AB 3-0 PS1 18XBRD (SUTURE) IMPLANT
SUT VIC AB 4-0 P-3 18XBRD (SUTURE) IMPLANT
SUT VIC AB 4-0 P3 18 (SUTURE)
SUT VICRYL 0 SH 27 (SUTURE) IMPLANT
SUT VICRYL 4-0 PS2 18IN ABS (SUTURE) ×2 IMPLANT
SUTURE FIBERWR 2-0 18 17.9 3/8 (SUTURE) ×2 IMPLANT
SUTURE FIBERWR 3-0 18 TAPR NDL (SUTURE) IMPLANT
SUTURE FIBERWR 4-0 18 DIA BLUE (SUTURE) IMPLANT
SYR BULB EAR ULCER 3OZ GRN STR (SYRINGE) ×2 IMPLANT
SYR CONTROL 10ML LL (SYRINGE) IMPLANT
TOWEL GREEN STERILE FF (TOWEL DISPOSABLE) ×4 IMPLANT
TUBE FEEDING ENTERAL 5FR 16IN (TUBING) IMPLANT
UNDERPAD 30X36 HEAVY ABSORB (UNDERPADS AND DIAPERS) ×2 IMPLANT

## 2022-03-11 NOTE — Op Note (Signed)
NAME: Claudia Ortiz RECORD NO: [id number] DATE OF BIRTH: 04/23/1941 FACILITY: [facility] LOCATION: [facility] PHYSICIAN: Tennis Must, MD   OPERATIVE REPORT   DATE OF PROCEDURE: 03/11/22    PREOPERATIVE DIAGNOSIS: Right thumb CMC osteoarthritis   POSTOPERATIVE DIAGNOSIS: Right thumb CMC osteoarthritis   PROCEDURE: 1.  Right thumb trapeziectomy 2.  Right thumb suspensioplasty   SURGEON:  Leanora Cover, M.D.   ASSISTANT: Daryll Brod, MD   ANESTHESIA:  Regional with sedation   INTRAVENOUS FLUIDS:  Per anesthesia flow sheet.   ESTIMATED BLOOD LOSS:  Minimal.   COMPLICATIONS:  None.   SPECIMENS:  none   TOURNIQUET TIME:    Total Tourniquet Time Documented: Upper Arm (Right) - 56 minutes Total: Upper Arm (Right) - 56 minutes    DISPOSITION:  Stable to PACU.   INDICATIONS: 81 year old female with right thumb CMC osteoarthritis.  She is tried nonoperative measures without lasting relief.  She wishes to have trapeziectomy and suspensioplasty.  Risks, benefits and alternatives of surgery were discussed including the risks of blood loss, infection, damage to nerves, vessels, tendons, ligaments, bone for surgery, need for additional surgery, complications with wound healing, continued pain, stiffness.  She voiced understanding of these risks and elected to proceed.  OPERATIVE COURSE:  After being identified preoperatively by myself,  the patient and I agreed on the procedure and site of the procedure.  The surgical site was marked.  Surgical consent had been signed. Preoperative IV antibiotic prophylaxis was given. She was transferred to the operating room and placed on the operating table in supine position with the right upper extremity on an arm board.  Sedation was induced by the anesthesiologist. A regional block had been performed by anesthesia in preoperative holding.    Right upper extremity was prepped and draped in normal sterile orthopedic fashion.  A  surgical pause was performed between the surgeons, anesthesia, and operating room staff and all were in agreement as to the patient, procedure, and site of procedure.  Tourniquet at the proximal aspect of the extremity was inflated to 250 mmHg after exsanguination of the arm with an Esmarch bandage.  Incision was made over the [facility] joint of the thumb dorsally.  This was carried into subcutaneous tissues by spreading technique.  Bipolar electrocautery was used to obtain hemostasis.  Cutaneous branches of the superficial branch of the radial nerve were identified and protected throughout the case.  The interval between the APL and EPB tendons was made.  The dorsal branch of radial artery was identified freed up and protected throughout the case.  The capsule was sharply incised.  It was elevated from the trapezium both volarly and dorsally.  The knife was used to release ligamentous attachments.  The freer was used to loosen up attachments as well.  The rondure was used to remove the trapezium.  The bone was completely removed.  The FCR tendon was identified.  Was placed under traction.  The volar half of the tendon was harvested creating a distally based tendon stump.  The guidepin for the microlink suture was then used.  It was placed through the base of the thumb metacarpal just anterior to the APL tendon.  It was then placed through the index metacarpal.  C-arm was used in AP lateral oblique projections to ensure appropriate position of the guidepin which was the case.  The microlink suture was passed.  The anchor was placed dorsally after making a dorsal incision on the hand.  A  hemostat was placed between the base of the thumb metacarpal and index metacarpal to prevent overtightening.  The C-arm was used in AP lateral oblique projections to ensure appropriate suspension of the thumb metacarpal which was the case.  The FCR tendon was then passed around the dorsal most slip of APL tendon.  It was sutured to the  tendon and then back onto itself using a 3-0 Ethibond suture.  The wound was copiously irrigated with sterile saline.  The dorsal hand wound was closed with a 4-0 nylon in a horizontal mattress fashion.  A 4-0 Vicryl suture was used to repair the capsule over the [facility] joint and then inverted interrupted 4-0 Vicryl sutures were placed in subcutaneous tissues and skin was closed with a running subcuticular 4-0 Monocryl suture.  This was augmented with benzoin and Steri-Strips.  The wounds were dressed with sterile 4 x 4's and wrapped with a Kerlix bandage.  A thumb spica splint was placed and wrapped with Kerlix and Ace bandage.  The tourniquet was deflated at 56 minutes.  Fingertips were pink with brisk capillary refill after deflation of tourniquet.  The operative  drapes were broken down.  The patient was awoken from anesthesia safely.  She was transferred back to the stretcher and taken to PACU in stable condition.  I will see her back in the office in 1 week for postoperative followup.  I will give her a prescription for Percocet 5/325 1-2 tabs PO q6 hours prn pain, dispense # 20.   Leanora Cover, MD Electronically signed, 03/11/22

## 2022-03-11 NOTE — Op Note (Signed)
I assisted Surgeon(s) and Role:    * Leanora Cover, MD - Primary    Daryll Brod, MD - Assisting on the Procedure(s): RIGHT THUMB TRAPEZIECTOMY AND SUSPENSIONPLASTY TENDON TRANSFER on 03/11/2022.  I provided assistance on this case as follows: Set up, approach, dissection identification radial artery with protection identification isolation and removal of the trapezium, harvesting of the flexor carpi radialis tendon for transfer, placement of the microlink suture, transfer of the flexor carpi radialis tendon, fixation of the microlink suture, closure of the wound and application dressing and splints.  Electronically signed by: Daryll Brod, MD Date: 03/11/2022 Time: 9:52 AM

## 2022-03-11 NOTE — H&P (Signed)
Claudia Ortiz is an 81 y.o. female.   Chief Complaint: cmc arthritis HPI: 81 yo female with right thumb cmc arthritis.  She has tried non operative measures without lasting relief.  She wishes to have right thumb trapeziectomy and suspensionplasty.  Allergies:  Allergies  Allergen Reactions   Iodine Anaphylaxis   Shellfish Allergy Anaphylaxis    Shrimp   Atorvastatin     Muscle aches    Cephalosporins     unkn    Penicillins     Unkn    Past Medical History:  Diagnosis Date   Anxiety    Arthritis 2019   Asthma    As Child   Cancer ([place]) 05/2020   R breast   Colon polyp    Elevated cholesterol    Intraductal papilloma of left breast     precancerous cells in R breast   Tricuspid valve mass    on 11/21/20 echo    Past Surgical History:  Procedure Laterality Date   AXILLARY SENTINEL NODE BIOPSY Right 05/12/2021   Procedure: AXILLARY SENTINEL NODE BIOPSY WITH MAGTRACE;  Surgeon: Rolm Bookbinder, MD;  Location: [place];  Service: General;  Laterality: Right;   BREAST LUMPECTOMY WITH RADIOACTIVE SEED AND SENTINEL LYMPH NODE BIOPSY Right 05/12/2021   Procedure: RIGHT BREAST LUMPECTOMY WITH RADIOACTIVE SEED X3;  Surgeon: Rolm Bookbinder, MD;  Location: [place];  Service: General;  Laterality: Right;   colon polyp     removed   CYST REMOVAL TRUNK     breast    EYE SURGERY Left    cataract removal   FOOT SURGERY     PORTACATH PLACEMENT Left 09/02/2020   Procedure: INSERTION PORT-A-CATH;  Surgeon: Rolm Bookbinder, MD;  Location: [place];  Service: General;  Laterality: Left;   TEE WITHOUT CARDIOVERSION N/A 01/01/2021   Procedure: TRANSESOPHAGEAL ECHOCARDIOGRAM (TEE);  Surgeon: Elouise Munroe, MD;  Location: [place];  Service: Cardiology;  Laterality: N/A;   TONSILLECTOMY     as a child    Family History: Family History  Problem Relation Age of Onset   Diabetes Mother    Heart attack Mother    Stroke Father    Heart disease Father     Heart attack Brother     Social History:   reports that she has never smoked. She has never used smokeless tobacco. She reports that she does not drink alcohol and does not use drugs.  Medications: Medications Prior to Admission  Medication Sig Dispense Refill   anastrozole (ARIMIDEX) 1 MG tablet Take 1 tablet (1 mg total) by mouth daily. 90 tablet 3   Apoaequorin (PREVAGEN) 10 MG CAPS Take 10 mg by mouth daily.     aspirin EC 81 MG tablet Take 1 tablet (81 mg total) by mouth daily. Swallow whole. 90 tablet 3   b complex vitamins capsule Take 1 capsule by mouth daily.     Bioflavonoid Products (SUPER C-500 COMPLEX PO) Take 1 tablet by mouth daily.     Biotin 5000 MCG CAPS Take 5,000 mcg by mouth daily.     cholecalciferol (VITAMIN D3) 25 MCG (1000 UNIT) tablet Take 1,000 Units by mouth daily.     dapagliflozin propanediol (FARXIGA) 10 MG TABS tablet Take 1 tablet (10 mg total) by mouth daily before breakfast. 90 tablet 3   Evening Primrose Oil 500 MG CAPS Take 500 mg by mouth daily.     Garlic 3790 MG CAPS Take 1,000 mg by mouth daily.  losartan (COZAAR) 25 MG tablet Take 1 tablet (25 mg total) by mouth daily. 90 tablet 3   metoprolol succinate (TOPROL XL) 25 MG 24 hr tablet Take 1.5 tablets (37.5 mg total) by mouth daily. 135 tablet 3   Multiple Vitamin (MULTIVITAMIN) tablet Take 1 tablet by mouth daily.     spironolactone (ALDACTONE) 25 MG tablet Take 0.5 tablets (12.5 mg total) by mouth daily. 45 tablet 3   Vitamin A 7.5 MG (25000 UT) CAPS Take 25,000 Units by mouth daily.     PFIZER-BIONT COVID-19 VAC-TRIS SUSP injection      rosuvastatin (CRESTOR) 10 MG tablet Take 1 tablet (10 mg total) by mouth every other day. 30 tablet 6    No results found for this or any previous visit (from the past 48 hour(s)).  DG MINI C-ARM IMAGE ONLY  Result Date: 03/11/2022 There is no interpretation for this exam.  This order is for images obtained during a surgical procedure.  Please See  "Surgeries" Tab for more information regarding the procedure.      Blood pressure 138/70, pulse 61, temperature 100.1 F (37.8 C), temperature source Oral, resp. rate 13, height '5\' 5"'$  (1.651 m), weight 86 kg, SpO2 96 %.  General appearance: alert, cooperative, and appears stated age Head: Normocephalic, without obvious abnormality, atraumatic Neck: supple, symmetrical, trachea midline Extremities: Intact sensation and capillary refill all digits.  +epl/fpl/io.  No wounds.  Pulses: 2+ and symmetric Skin: Skin color, texture, turgor normal. No rashes or lesions Neurologic: Grossly normal Incision/Wound: none  Assessment/Plan Right thumb cmc arthritis.  Non operative and operative treatment options have been discussed with the patient and patient wishes to proceed with operative treatment. Risks, benefits, and alternatives of surgery have been discussed and the patient agrees with the plan of care.   Claudia Ortiz 03/11/2022, 8:28 AM

## 2022-03-11 NOTE — Progress Notes (Signed)
Assisted Dr. Oddono with right, pectoralis, ultrasound guided block. Side rails up, monitors on throughout procedure. See vital signs in flow sheet. Tolerated Procedure well. 

## 2022-03-11 NOTE — Anesthesia Procedure Notes (Signed)
Procedure Name: MAC Date/Time: 03/11/2022 8:35 AM  Performed by: Glory Buff, CRNAPre-anesthesia Checklist: Patient identified, Emergency Drugs available, Suction available and Patient being monitored Patient Re-evaluated:Patient Re-evaluated prior to induction Oxygen Delivery Method: Simple face mask

## 2022-03-11 NOTE — Anesthesia Postprocedure Evaluation (Signed)
Anesthesia Post Note  Patient: Claudia Ortiz  Procedure(s) Performed: RIGHT THUMB TRAPEZIECTOMY AND SUSPENSIONPLASTY (Right: Thumb) TENDON TRANSFER (Right: Hand)     Patient location during evaluation: PACU Anesthesia Type: Regional and MAC Level of consciousness: awake and alert Pain management: pain level controlled Vital Signs Assessment: post-procedure vital signs reviewed and stable Respiratory status: spontaneous breathing, nonlabored ventilation, respiratory function stable and patient connected to nasal cannula oxygen Cardiovascular status: stable and blood pressure returned to baseline Postop Assessment: no apparent nausea or vomiting Anesthetic complications: no   No notable events documented.  Last Vitals:  Vitals:   03/11/22 1015 03/11/22 1027  BP: 120/66 128/73  Pulse: (!) 53 (!) 53  Resp: 11 16  Temp:    SpO2: 96% 96%    Last Pain:  Vitals:   03/11/22 1027  TempSrc:   PainSc: 0-No pain                 Itha Kroeker

## 2022-03-11 NOTE — Anesthesia Procedure Notes (Signed)
Anesthesia Regional Block: Axillary brachial plexus block   Pre-Anesthetic Checklist: , timeout performed,  Correct Patient, Correct Site, Correct Laterality,  Correct Procedure, Correct Position, site marked,  Risks and benefits discussed,  Surgical consent,  Pre-op evaluation,  At surgeon's request and post-op pain management  Laterality: Right  Prep: chloraprep       Needles:  Injection technique: Single-shot  Needle Type: Echogenic Stimulator Needle     Needle Length: 5cm  Needle Gauge: 22     Additional Needles:   Procedures:, nerve stimulator,,, ultrasound used (permanent image in chart),,     Nerve Stimulator or Paresthesia:  Response: hand, 0.45 mA  Additional Responses:   Narrative:  Start time: 03/11/2022 8:00 AM End time: 03/11/2022 8:12 AM Injection made incrementally with aspirations every 5 mL.  Performed by: Personally  Anesthesiologist: Janeece Riggers, MD  Additional Notes: Functioning IV was confirmed and monitors were applied.  A 46m 22ga Arrow echogenic stimulator needle was used. Sterile prep and drape,hand hygiene and sterile gloves were used. Ultrasound guidance: relevant anatomy identified, needle position confirmed, local anesthetic spread visualized around nerve(s)., vascular puncture avoided.  Image printed for medical record. Negative aspiration and negative test dose prior to incremental administration of local anesthetic. The patient tolerated the procedure well.

## 2022-03-11 NOTE — Discharge Instructions (Addendum)
[facility] Instructions Hand Surgery  Wound Care: Keep your hand elevated above the level of your heart.  Do not allow it to dangle by your side.  Keep the dressing dry and do not remove it unless your doctor advises you to do so.  He will usually change it at the time of your post-op visit.  Moving your fingers is advised to stimulate circulation but will depend on the site of your surgery.  If you have a splint applied, your doctor will advise you regarding movement.  Activity: Do not drive or operate machinery today.  Rest today and then you may return to your normal activity and work as indicated by your physician.  Diet:  Drink liquids today or eat a light diet.  You may resume a regular diet tomorrow.    General expectations: Pain for two to three days. Fingers may become slightly swollen.  Call your doctor if any of the following occur: Severe pain not relieved by pain medication. Elevated temperature. Dressing soaked with blood. Inability to move fingers. White or bluish color to fingers.   Post Anesthesia Home Care Instructions  Activity: Get plenty of rest for the remainder of the day. A responsible individual must stay with you for 24 hours following the procedure.  For the next 24 hours, DO NOT: -Drive a car -Operate machinery -Drink alcoholic beverages -Take any medication unless instructed by your physician -Make any legal decisions or sign important papers.  Meals: Start with liquid foods such as gelatin or soup. Progress to regular foods as tolerated. Avoid greasy, spicy, heavy foods. If nausea and/or vomiting occur, drink only clear liquids until the nausea and/or vomiting subsides. Call your physician if vomiting continues.  Special Instructions/Symptoms: Your throat may feel dry or sore from the anesthesia or the breathing tube placed in your throat during surgery. If this causes discomfort, gargle with warm salt water. The discomfort should disappear within  24 hours.     Regional Anesthesia Blocks  1. Numbness or the inability to move the "blocked" extremity may last from 3-48 hours after placement. The length of time depends on the medication injected and your individual response to the medication. If the numbness is not going away after 48 hours, call your surgeon.  2. The extremity that is blocked will need to be protected until the numbness is gone and the  Strength has returned. Because you cannot feel it, you will need to take extra care to avoid injury. Because it may be weak, you may have difficulty moving it or using it. You may not know what position it is in without looking at it while the block is in effect.  3. For blocks in the legs and feet, returning to weight bearing and walking needs to be done carefully. You will need to wait until the numbness is entirely gone and the strength has returned. You should be able to move your leg and foot normally before you try and bear weight or walk. You will need someone to be with you when you first try to ensure you do not fall and possibly risk injury.  4. Bruising and tenderness at the needle site are common side effects and will resolve in a few days.  5. Persistent numbness or new problems with movement should be communicated to the surgeon or the [facility] ([phone])/ [facility] ([phone]).  

## 2022-03-11 NOTE — Transfer of Care (Signed)
Immediate Anesthesia Transfer of Care Note  Patient: Claudia Ortiz  Procedure(s) Performed: RIGHT THUMB TRAPEZIECTOMY AND SUSPENSIONPLASTY (Right: Thumb) TENDON TRANSFER (Right: Hand)  Patient Location: PACU  Anesthesia Type:MAC and Regional  Level of Consciousness: drowsy, patient cooperative and responds to stimulation  Airway & Oxygen Therapy: Patient Spontanous Breathing and Patient connected to face mask oxygen  Post-op Assessment: Report given to RN and Post -op Vital signs reviewed and stable  Post vital signs: Reviewed and stable  Last Vitals:  Vitals Value Taken Time  BP    Temp    Pulse 58 03/11/22 0955  Resp 15 03/11/22 0955  SpO2 97 % 03/11/22 0955  Vitals shown include unvalidated device data.  Last Pain:  Vitals:   03/11/22 0736  TempSrc: Oral  PainSc: 0-No pain      Patients Stated Pain Goal: 4 (98/92/11 9417)  Complications: No notable events documented.

## 2022-03-12 ENCOUNTER — Encounter (HOSPITAL_BASED_OUTPATIENT_CLINIC_OR_DEPARTMENT_OTHER): Payer: Self-pay | Admitting: Orthopedic Surgery

## 2022-03-19 DIAGNOSIS — M1811 Unilateral primary osteoarthritis of first carpometacarpal joint, right hand: Secondary | ICD-10-CM | POA: Diagnosis not present

## 2022-03-19 DIAGNOSIS — R52 Pain, unspecified: Secondary | ICD-10-CM | POA: Diagnosis not present

## 2022-04-05 ENCOUNTER — Telehealth: Payer: Self-pay | Admitting: *Deleted

## 2022-04-05 NOTE — Telephone Encounter (Signed)
Received request from [employer] for new rx for Faxiga.   New Rx faxed

## 2022-04-17 DIAGNOSIS — Z23 Encounter for immunization: Secondary | ICD-10-CM | POA: Diagnosis not present

## 2022-04-21 ENCOUNTER — Other Ambulatory Visit (HOSPITAL_COMMUNITY): Payer: Medicare Other

## 2022-04-21 ENCOUNTER — Ambulatory Visit (HOSPITAL_COMMUNITY): Payer: Medicare Other | Attending: Cardiology

## 2022-04-21 DIAGNOSIS — I5042 Chronic combined systolic (congestive) and diastolic (congestive) heart failure: Secondary | ICD-10-CM | POA: Insufficient documentation

## 2022-04-22 LAB — ECHOCARDIOGRAM COMPLETE: S' Lateral: 2.9 cm

## 2022-04-23 DIAGNOSIS — M1811 Unilateral primary osteoarthritis of first carpometacarpal joint, right hand: Secondary | ICD-10-CM | POA: Diagnosis not present

## 2022-04-30 NOTE — Telephone Encounter (Signed)
Received fax from [employer] Patient approved for patient assistance for Farxiga until 07/19/2023

## 2022-05-03 ENCOUNTER — Other Ambulatory Visit: Payer: Self-pay | Admitting: Cardiology

## 2022-05-10 ENCOUNTER — Ambulatory Visit: Payer: Medicare Other | Attending: General Surgery

## 2022-05-10 VITALS — Wt 188.2 lb

## 2022-05-10 DIAGNOSIS — Z483 Aftercare following surgery for neoplasm: Secondary | ICD-10-CM | POA: Insufficient documentation

## 2022-05-10 NOTE — Therapy (Signed)
OUTPATIENT PHYSICAL THERAPY SOZO SCREENING NOTE   Patient Name: Claudia Ortiz MRN: [id number] DOB:1941/07/04, 81 y.o., female Today's Date: 05/10/2022  PCP: Kelton Pillar, MD REFERRING PROVIDER: Rolm Bookbinder, MD   PT End of Session - 05/10/22 1052     Visit Number 1   # unchanged due to screen only   PT Start Time 1047    PT Stop Time 1058    PT Time Calculation (min) 11 min    Activity Tolerance Patient tolerated treatment well    Behavior During Therapy [facility] for tasks assessed/performed             Past Medical History:  Diagnosis Date   Anxiety    Arthritis 2019   Asthma    As Child   Cancer ([place]) 05/2020   R breast   Colon polyp    Elevated cholesterol    Intraductal papilloma of left breast     precancerous cells in R breast   Tricuspid valve mass    on 11/21/20 echo   Past Surgical History:  Procedure Laterality Date   AXILLARY SENTINEL NODE BIOPSY Right 05/12/2021   Procedure: AXILLARY SENTINEL NODE BIOPSY WITH MAGTRACE;  Surgeon: Rolm Bookbinder, MD;  Location: [place];  Service: General;  Laterality: Right;   BREAST LUMPECTOMY WITH RADIOACTIVE SEED AND SENTINEL LYMPH NODE BIOPSY Right 05/12/2021   Procedure: RIGHT BREAST LUMPECTOMY WITH RADIOACTIVE SEED X3;  Surgeon: Rolm Bookbinder, MD;  Location: [place];  Service: General;  Laterality: Right;   [place] Right 03/11/2022   Procedure: RIGHT THUMB TRAPEZIECTOMY AND SUSPENSIONPLASTY;  Surgeon: Leanora Cover, MD;  Location: [place];  Service: Orthopedics;  Laterality: Right;   colon polyp     removed   CYST REMOVAL TRUNK     breast    EYE SURGERY Left    cataract removal   FOOT SURGERY     PORTACATH PLACEMENT Left 09/02/2020   Procedure: INSERTION PORT-A-CATH;  Surgeon: Rolm Bookbinder, MD;  Location: [place];  Service: General;  Laterality: Left;   TEE WITHOUT CARDIOVERSION N/A 01/01/2021   Procedure: TRANSESOPHAGEAL  ECHOCARDIOGRAM (TEE);  Surgeon: Elouise Munroe, MD;  Location: [place];  Service: Cardiology;  Laterality: N/A;   TENDON TRANSFER Right 03/11/2022   Procedure: TENDON TRANSFER;  Surgeon: Leanora Cover, MD;  Location: [place];  Service: Orthopedics;  Laterality: Right;   TONSILLECTOMY     as a child   Patient Active Problem List   Diagnosis Date Noted   HFrEF (heart failure with reduced ejection fraction) ([place]) 07/03/2021   Tricuspid valve mass    Port-A-Cath in place 09/10/2020   Malignant neoplasm of lower-outer quadrant of right breast of female, estrogen receptor positive ([place]) 07/14/2020   Dyspnea 02/18/2014   Fatigue 02/18/2014   Abnormal ECG 02/18/2014   History of left breast cancer 02/18/2014   Hyperlipidemia 02/18/2014    REFERRING DIAG: right breast cancer at risk for lymphedema  THERAPY DIAG: Aftercare following surgery for neoplasm  PERTINENT HISTORY: Patient was diagnosed on 06/17/2020 with right grade III invasive ductal carcinoma breast cancer. She underwent neoadjuvant chemotherapy 09/03/2020 - 11/19/2020 followed by a right lumpectomy and sentinel node biopsy (3 negative nodes) on 10/25/202. It is ER positive, PR negative and HER2 positive with a Ki67 of 40%.   PRECAUTIONS: right UE Lymphedema risk  SUBJECTIVE: Here for SOZO screen; doing well. "I've had hand surgery since I was here."   PAIN:  Are you having pain?  No  SOZO SCREENING: Patient was assessed today using the SOZO machine to determine the lymphedema index score. This was compared to her baseline score. It was determined that she is within the recommended range when compared to her baseline and no further action is needed at this time. She will continue SOZO screenings. These are done every 3 months for 2 years post operatively followed by every 6 months for 2 years, and then annually.   L-DEX FLOWSHEETS - 05/10/22 1000       L-DEX LYMPHEDEMA SCREENING   Measurement Type  Unilateral    L-DEX MEASUREMENT EXTREMITY Upper Extremity    POSITION  Standing    DOMINANT SIDE Right    At Risk Side Right    BASELINE SCORE (UNILATERAL) 1.8    L-DEX SCORE (UNILATERAL) -0.3    VALUE CHANGE (UNILAT) -2.1               Collie Siad, PTA 05/10/22 10:58 AM

## 2022-05-11 ENCOUNTER — Ambulatory Visit: Payer: Medicare Other | Attending: Cardiology | Admitting: Cardiology

## 2022-05-11 VITALS — BP 118/74 | HR 74 | Ht 65.0 in | Wt 191.0 lb

## 2022-05-11 DIAGNOSIS — D151 Benign neoplasm of heart: Secondary | ICD-10-CM

## 2022-05-11 DIAGNOSIS — E785 Hyperlipidemia, unspecified: Secondary | ICD-10-CM

## 2022-05-11 DIAGNOSIS — I5042 Chronic combined systolic (congestive) and diastolic (congestive) heart failure: Secondary | ICD-10-CM | POA: Diagnosis not present

## 2022-05-11 NOTE — Patient Instructions (Signed)
Medication Instructions:  Your physician recommends that you continue on your current medications as directed. Please refer to the Current Medication list given to you today.  *If you need a refill on your cardiac medications before your next appointment, please call your pharmacy*  Lab Work: BMET, Mag today  If you have labs (blood work) drawn today and your tests are completely normal, you will receive your results only by: MyChart Message (if you have MyChart) OR A paper copy in the mail If you have any lab test that is abnormal or we need to change your treatment, we will call you to review the results.  Follow-Up: At [facility], you and your health needs are our priority.  As part of our continuing mission to provide you with exceptional heart care, we have created designated Provider Care Teams.  These Care Teams include your primary Cardiologist (physician) and Advanced Practice Providers (APPs -  Physician Assistants and Nurse Practitioners) who all work together to provide you with the care you need, when you need it.  We recommend signing up for the patient portal called "MyChart".  Sign up information is provided on this After Visit Summary.  MyChart is used to connect with patients for Virtual Visits (Telemedicine).  Patients are able to view lab/test results, encounter notes, upcoming appointments, etc.  Non-urgent messages can be sent to your provider as well.   To learn more about what you can do with MyChart, go to [email or web address].    Your next appointment:   6 month(s)  The format for your next appointment:   In Person  Provider:   Christopher L Schumann, MD       

## 2022-05-11 NOTE — Progress Notes (Signed)
Cardiology Office Note:    Date:  05/11/2022   ID:  Claudia Ortiz, DOB 10-Aug-1940, MRN [id number]  PCP:  Kelton Pillar, MD   [facility] Providers Cardiologist:  Donato Heinz, MD     Referring MD: Kelton Pillar, MD   CC: CHF  History of Present Illness:    Claudia Ortiz is a 81 y.o. female with a hx of anxiety, elevated cholesterol, colon polyp, intraductal papilloma of left breast, and tricuspid valve mass who presents for follow-up.  She was refered by Dr. Laurann Montana for pre-op clearance for lumpectomy, initially seen on 12/23/2020.  Echocardiogram on 11/21/2020 showed EF 50 to 55%, normal RV function, echodensity on the tricuspid valve measuring 1 cm, mild TR. Transesophageal echocardiogram on 01/01/2021 showed 1.1 x 0.8 cm mass adherent to the atrial aspect of the posterior tricuspid valve leaflet, likely representing thrombus.  Also with small mobile echodensity on the pulmonary artery aspect of the pulmonary valve, likely representing thrombus.  EF 50%, normal RV function, small pericardial effusion, small PFO.  Lower extremity duplex on 01/02/2021 was negative.  Echocardiogram 04/07/2021 showed EF 35 to 40%, global hypokinesis, and grade 1 diastolic dysfunction, normal RV function, mild MR, unchanged tricuspid valve mass.  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  Calcium score 150 on 01/08/2022 (55th percentile).  Echocardiogram 04/21/2022 showed EF 50 to 55%, normal RV function, stable tricuspid valve mass, no significant valvular disease.  Since last clinic visit, she reports she is doing well.  Denies any chest pain, dyspnea,  or palpitations.  Reports some lightheadedness but denies any syncope.  Some swelling in the ankle.   Past Medical History:  Diagnosis Date   Anxiety    Arthritis 2019   Asthma    As  Child   Cancer ([place]) 05/2020   R breast   Colon polyp    Elevated cholesterol    Intraductal papilloma of left breast     precancerous cells in R breast   Tricuspid valve mass    on 11/21/20 echo    Past Surgical History:  Procedure Laterality Date   AXILLARY SENTINEL NODE BIOPSY Right 05/12/2021   Procedure: AXILLARY SENTINEL NODE BIOPSY WITH MAGTRACE;  Surgeon: Rolm Bookbinder, MD;  Location: [place];  Service: General;  Laterality: Right;   BREAST LUMPECTOMY WITH RADIOACTIVE SEED AND SENTINEL LYMPH NODE BIOPSY Right 05/12/2021   Procedure: RIGHT BREAST LUMPECTOMY WITH RADIOACTIVE SEED X3;  Surgeon: Rolm Bookbinder, MD;  Location: [place];  Service: General;  Laterality: Right;   [place] Right 03/11/2022   Procedure: RIGHT THUMB TRAPEZIECTOMY AND SUSPENSIONPLASTY;  Surgeon: Leanora Cover, MD;  Location: [place];  Service: Orthopedics;  Laterality: Right;   colon polyp     removed   CYST REMOVAL TRUNK     breast    EYE SURGERY Left    cataract removal   FOOT SURGERY     PORTACATH PLACEMENT Left 09/02/2020   Procedure: INSERTION PORT-A-CATH;  Surgeon: Rolm Bookbinder, MD;  Location: [place];  Service: General;  Laterality: Left;   TEE WITHOUT CARDIOVERSION N/A 01/01/2021   Procedure: TRANSESOPHAGEAL ECHOCARDIOGRAM (TEE);  Surgeon: Elouise Munroe, MD;  Location: [place];  Service: Cardiology;  Laterality: N/A;   TENDON TRANSFER Right 03/11/2022   Procedure: TENDON TRANSFER;  Surgeon: Leanora Cover, MD;  Location: [place]  [place];  Service: Orthopedics;  Laterality: Right;   TONSILLECTOMY     as a child    Current Medications: Current Meds  Medication Sig   anastrozole (ARIMIDEX) 1 MG tablet Take 1 tablet (1 mg total) by mouth daily.   Apoaequorin (PREVAGEN) 10 MG CAPS Take 10 mg by mouth daily.   aspirin EC 81 MG tablet Take 1 tablet (81 mg total) by mouth daily. Swallow whole.   b complex  vitamins capsule Take 1 capsule by mouth daily.   Bioflavonoid Products (SUPER C-500 COMPLEX PO) Take 1 tablet by mouth daily.   Biotin 5000 MCG CAPS Take 5,000 mcg by mouth daily.   cholecalciferol (VITAMIN D3) 25 MCG (1000 UNIT) tablet Take 1,000 Units by mouth daily.   dapagliflozin propanediol (FARXIGA) 10 MG TABS tablet Take 1 tablet (10 mg total) by mouth daily before breakfast.   Evening Primrose Oil 500 MG CAPS Take 500 mg by mouth daily.   Garlic 0093 MG CAPS Take 1,000 mg by mouth daily.   losartan (COZAAR) 25 MG tablet Take 1 tablet (25 mg total) by mouth daily.   metoprolol succinate (TOPROL-XL) 25 MG 24 hr tablet Take 1 tablet by mouth once daily   Multiple Vitamin (MULTIVITAMIN) tablet Take 1 tablet by mouth daily.   PFIZER-BIONT COVID-19 VAC-TRIS SUSP injection    spironolactone (ALDACTONE) 25 MG tablet Take 0.5 tablets (12.5 mg total) by mouth daily.   Vitamin A 7.5 MG (25000 UT) CAPS Take 25,000 Units by mouth daily.     Allergies:   Iodine, Shellfish allergy, Atorvastatin, Cephalosporins, and Penicillins   Social History   Socioeconomic History   Marital status: Married    Spouse name: Not on file   Number of children: Not on file   Years of education: Not on file   Highest education level: Not on file  Occupational History   Not on file  Tobacco Use   Smoking status: Never   Smokeless tobacco: Never  Vaping Use   Vaping Use: Never used  Substance and Sexual Activity   Alcohol use: No   Drug use: No   Sexual activity: Yes  Other Topics Concern   Not on file  Social History Narrative   Not on file   Social Determinants of Health   Financial Resource Strain: Not on file  Food Insecurity: Not on file  Transportation Needs: Not on file  Physical Activity: Not on file  Stress: Not on file  Social Connections: Not on file     Family History: The patient's family history includes Diabetes in her mother; Heart attack in her brother and mother; Heart  disease in her father; Stroke in her father.  ROS:   Please see the history of present illness.     All other systems reviewed and are negative.  EKGs/Labs/Other Studies Reviewed:    The following studies were reviewed today:  [place] LE Venous 01/02/2021: RIGHT:  - No evidence of deep vein thrombosis in the lower extremity. No indirect  evidence of obstruction proximal to the inguinal ligament.  - No cystic structure found in the popliteal fossa.     LEFT:  - No evidence of deep vein thrombosis in the lower extremity. No indirect  evidence of obstruction proximal to the inguinal ligament.  - No cystic structure found in the popliteal fossa.  - Small amount of superficial edema seen at left medial ankle.   TEE 01/01/2021:  1. There is a 1.1 x 0.8 cm  mass that appears adherent to the atrial  aspect of the posterior tricuspid valve leaflet. In the setting of  malignancy, this mass likely represents thrombus. . The tricuspid valve is  abnormal.   2. There is a small mobile echodensity on the PA aspect of the pulmonary  valve. This mass likely represents thrombus given findings on tricuspid  valve. . The pulmonic valve was abnormal.   3. Left ventricular ejection fraction, by estimation, is 50%. The left  ventricle has low normal function.   4. Right ventricular systolic function is normal. The right ventricular  size is normal.   5. No left atrial/left atrial appendage thrombus was detected. The LAA  emptying velocity was 56 cm/s.   6. A small pericardial effusion is present. The pericardial effusion is  in the transverse sinus. There is a small amount of epicardial fat noted  in the transverse sinus freely mobile in pericardial fluid.   7. The mitral valve is normal in structure. Trivial mitral valve  regurgitation.   8. The aortic valve is grossly normal. There is mild calcification of the  aortic valve. Aortic valve regurgitation is not visualized. No aortic  stenosis is present.    9. Evidence of atrial level shunting detected by color flow Doppler.  There is a small patent foramen ovale with predominantly left to right  shunting across the atrial septum. Possible small fenestration in atrial  septum.   Comparison(s): Tricuspid valve mass was present on the study from 11/21/20.  On review of TTE from 07/25/20, this mass may have been present at that time  as well, given similar location of echo density, however more subtle and  image quality did not optimize.   Conclusion(s)/Recommendation(s): Critical findings reported to Dr.  Gardiner Rhyme and acknowledged at 01/01/21 3:53 pm.   Echo 11/21/2020: 1. Basal inferior hypokinesis. . Left ventricular ejection fraction, by  estimation, is 50 to 55%. The left ventricle has low normal function.   2. Right ventricular systolic function is normal. The right ventricular  size is normal. There is normal pulmonary artery systolic pressure.   3. Trivial mitral valve regurgitation.   4. 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 It measures 11 x 10 mm. Review of echo from Jan 2022 (the only previous echo) the echodensity appears to be present there as well though windows are not as good. Would consider TEE to further evaluate.   5. The aortic valve is normal in structure. Aortic valve regurgitation is  not visualized.   6. The inferior vena cava is normal in size with greater than 50%  respiratory variability, suggesting right atrial pressure of 3 mmHg.  Echo 07/25/2020: 1. Left ventricular ejection fraction, by estimation, is 55 to 60%. The  left ventricle has normal function. The left ventricle has no regional  wall motion abnormalities. Left ventricular diastolic parameters are  consistent with Grade I diastolic  dysfunction (impaired relaxation).   2. Right ventricular systolic function is normal. The right ventricular  size is normal.   3. The mitral valve is normal in structure. Trivial  mitral valve  regurgitation. No evidence of mitral stenosis.   4. The aortic valve is tricuspid. Aortic valve regurgitation is not  visualized. No aortic stenosis is present.   Conclusion(s)/Recommendation(s): Normal biventricular function without evidence of hemodynamically significant valvular heart disease.  EKG:   05/11/22: Normal sinus rhythm, Q waves in leads III, aVF, poor R wave progression, rate 74 04/13/21:  NSR, rate 82, poor R wave progression, Q waves III, aVF 01/05/2021: EKG is not ordered today. 12/23/2020: NSR, rate 89, Low voltage, Nonspecific T wave flattening  Recent Labs: 05/27/2021: ALT 18; Hemoglobin 12.1; Platelet Count 208 12/30/2021: BUN 20; Creatinine, Ser 1.12; Magnesium 2.1; Potassium 4.8; Sodium 143  Recent Lipid Panel    Component Value Date/Time   CHOL 143 12/30/2021 1653   TRIG 77 12/30/2021 1653   HDL 56 12/30/2021 1653   CHOLHDL 2.6 12/30/2021 1653   LDLCALC 72 12/30/2021 1653      Physical Exam:    VS:  BP 118/74 (BP Location: Left Arm, Patient Position: Sitting, Cuff Size: Large)   Pulse 74   Ht '5\' 5"'$  (1.651 m)   Wt 191 lb (86.6 kg)   SpO2 96%   BMI 31.78 kg/m     Wt Readings from Last 3 Encounters:  05/11/22 191 lb (86.6 kg)  05/10/22 188 lb 4 oz (85.4 kg)  03/11/22 189 lb 9.5 oz (86 kg)     GEN: Well nourished, well developed in no acute distress HEENT: Normal NECK: No JVD; No carotid bruits LYMPHATICS: No lymphadenopathy CARDIAC: RRR, no murmurs, rubs, gallops RESPIRATORY:  Clear to auscultation without rales, wheezing or rhonchi  ABDOMEN: Soft, non-tender, non-distended MUSCULOSKELETAL:  Trace LE edema; No deformity  SKIN: Warm and dry NEUROLOGIC:  Alert and oriented x 3 PSYCHIATRIC:  Normal affect   ASSESSMENT:    1. Chronic combined systolic and diastolic heart failure ([place])   2. Papillary fibroelastoma   3. Hyperlipidemia, unspecified hyperlipidemia type      PLAN:    Tricuspid valve fibroelastoma: Echocardiogram on  11/21/2020 showed echodensity on the tricuspid valve measuring 1 cm, mild TR.  Transesophageal echocardiogram on 01/01/2021 showed 1.1 x 0.8 cm mass adherent to the atrial aspect of the posterior tricuspid valve leaflet, likely representing thrombus.  Also with small mobile echodensity on the pulmonary artery aspect of the pulmonary valve, likely representing thrombus.  EF 50%, normal RV function, small pericardial effusion, small PFO.  Lower extremity duplex on 01/02/2021 was negative for DVT  Echocardiogram 04/07/2021 showed EF 35 to 40%, global hypokinesis, and grade 1 diastolic dysfunction, normal RV function, mild MR, unchanged tricuspid valve mass.  Stress MRI on 04/18/2021 showed mobile mass attached to septal leaflet of tricuspid valve consistent with papillary fibroelatoma -Blood cultures negative when mass initially seen.  Initially suspected thrombus, completed 3 months of anticoagulation with repeat echo showing unchanged mass.  Cardiac MRI confirms fibroelastoma -Given right-sided fibroelastoma and considering age and no history of embolic events, do not recommend surgery -Discussed continuing Eliquis, which may have some benefit given fibroelastoma but discussed that there is little data on this.  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  Her preference was to stop the Eliquis and I think this is reasonable.  Switched to aspirin 81 mg daily  Chronic combined systolic and diastolic heart failure: Echocardiogram 04/07/2021 showed EF 35 to 40%, global hypokinesis, and grade 1 diastolic dysfunction, normal RV function, mild MR, unchanged tricuspid valve mass.  Stress MRI on 04/18/2021 showed no evidence of ischemia on stress perfusion, no LGE, LVEF 43%, RVEF 53%.  Echo 05/25/2021 showed EF 40%, normal RV function, mild to moderate left atrial dilatation, mild right atrial dilatation, 1 cm x 0.9 cm  tricuspid valve mass.  Echocardiogram 04/21/2022 showed EF 50 to 55%, normal RV function, stable tricuspid  valve mass, no significant valvular disease. -Continue Toprol-XL 25 mg daily -Continue losartan 25 mg daily.  Discussed switching to [facility] but she would prefer to hold off -Continue spironolactone 12.5 mg daily -Continue Farxiga 10 mg daily -Check BMET, magnesium  Breast cancer: Underwent lumpectomy on 05/12/2021.    Hyperlipidemia: LDL 72 on 12/30/2021, on rosuvastatin 10 mg daily.   Calcium score 150 on 01/08/2022 (55th percentile)  RTC in 6 months   Medication Adjustments/Labs and Tests Ordered: Current medicines are reviewed at length with the patient today.  Concerns regarding medicines are outlined above.  Orders Placed This Encounter  Procedures   Basic metabolic panel   Magnesium   EKG 12-Lead      No orders of the defined types were placed in this encounter.     Patient Instructions  Medication Instructions:  Your physician recommends that you continue on your current medications as directed. Please refer to the Current Medication list given to you today.  *If you need a refill on your cardiac medications before your next appointment, please call your pharmacy*   Lab Work: BMET, [place] today  If you have labs (blood work) drawn today and your tests are completely normal, you will receive your results only by: [place] (if you have MyChart) OR A paper copy in the mail If you have any lab test that is abnormal or we need to change your treatment, we will call you to review the results.  Follow-Up: At [facility], you and your health needs are our priority.  As part of our continuing mission to provide you with exceptional heart care, we have created designated Provider Care Teams.  These Care Teams include your primary Cardiologist (physician) and Advanced Practice Providers (APPs -  Physician Assistants and Nurse Practitioners) who all work  together to provide you with the care you need, when you need it.  We recommend signing up for the patient portal called "MyChart".  Sign up information is provided on this After Visit Summary.  MyChart is used to connect with patients for Virtual Visits (Telemedicine).  Patients are able to view lab/test results, encounter notes, upcoming appointments, etc.  Non-urgent messages can be sent to your provider as well.   To learn more about what you can do with MyChart, go to [email or web address].    Your next appointment:   6 month(s)  The format for your next appointment:   In Person  Provider:   Donato Heinz, MD              Signed, Donato Heinz, MD  05/11/2022 3:24 PM    [place]

## 2022-05-12 LAB — BASIC METABOLIC PANEL
BUN/Creatinine Ratio: 17 (ref 12–28)
BUN: 16 mg/dL (ref 8–27)
CO2: 22 mmol/L (ref 20–29)
Calcium: 10.2 mg/dL (ref 8.7–10.3)
Chloride: 103 mmol/L (ref 96–106)
Creatinine, Ser: 0.93 mg/dL (ref 0.57–1.00)
Glucose: 90 mg/dL (ref 70–99)
Potassium: 4.5 mmol/L (ref 3.5–5.2)
Sodium: 142 mmol/L (ref 134–144)
eGFR: 62 mL/min/{1.73_m2} (ref >59)

## 2022-05-12 LAB — MAGNESIUM: Magnesium: 2.1 mg/dL (ref 1.6–2.3)

## 2022-05-19 ENCOUNTER — Encounter: Payer: Self-pay | Admitting: *Deleted

## 2022-05-31 ENCOUNTER — Other Ambulatory Visit: Payer: Self-pay | Admitting: Cardiology

## 2022-06-09 DIAGNOSIS — M76822 Posterior tibial tendinitis, left leg: Secondary | ICD-10-CM | POA: Diagnosis not present

## 2022-06-17 DIAGNOSIS — R262 Difficulty in walking, not elsewhere classified: Secondary | ICD-10-CM | POA: Diagnosis not present

## 2022-06-23 ENCOUNTER — Encounter: Payer: Self-pay | Admitting: *Deleted

## 2022-06-23 NOTE — Progress Notes (Unsigned)
Received preoperative risk assessment from [facility] for upcoming left foot flat foot reconstruction surgery.  Per MD pt medically cleared on an Oncology standpoint, pt to stop Anastrozole 1 week prior to surgery and resume 2 weeks post surgery.  Form successfully faxed back to [phone].

## 2022-06-24 ENCOUNTER — Telehealth: Payer: Self-pay

## 2022-06-24 NOTE — Telephone Encounter (Signed)
..     Pre-operative Risk Assessment    Patient Name: Claudia Ortiz  DOB: [birth year] MRN: [id number]      Request for Surgical Clearance    Procedure:  left foot flat reconstruction, calcaneal slide osteotomy, posterior tibial tendon debridement calcaneonavicular ligament reconstruction, flexor digitorum longus transfer, medial column plantarflexion osteotomy, tendo achilles lengthening  Date of Surgery:  Clearance 07/15/22                                 Surgeon:  dr Radene Journey Surgeon's Group or Practice Name:  [facility] Phone number:  [phone] Fax number:  [phone]   Type of Clearance Requested:   - Medical  - Pharmacy:  Hold Aspirin     Type of Anesthesia:  General    Additional requests/questions:    SignedMerry Lofty   06/24/2022, 2:18 PM

## 2022-06-25 NOTE — Telephone Encounter (Signed)
   Patient Name: Claudia Ortiz  DOB: 03/19/1941 MRN: [id number]  Primary Cardiologist: Donato Heinz, MD  Chart reviewed as part of pre-operative protocol coverage. Given past medical history and time since last visit, based on ACC/AHA guidelines, and per Dr. Gardiner Rhyme, primary cardiologist, [name] is at acceptable risk for the planned procedure without further cardiovascular testing.   Per office protocol, she may hold Aspirin for 5-7 days prior to procedure. Please resume Aspirin as soon as possible postprocedure, at the discretion of the surgeon.   I will route this recommendation to the requesting party via Epic fax function and remove from pre-op pool.  Please call with questions.  Lenna Sciara, NP 06/25/2022, 8:04 AM

## 2022-06-25 NOTE — Telephone Encounter (Signed)
No further workup recommended prior to her surgery.  Okay to hold aspirin

## 2022-07-05 ENCOUNTER — Other Ambulatory Visit: Payer: Self-pay | Admitting: Cardiology

## 2022-07-06 ENCOUNTER — Telehealth: Payer: Self-pay | Admitting: Cardiology

## 2022-07-06 NOTE — Telephone Encounter (Signed)
Pt wants to know if she should hold her farxiga in addition to the other medications she needs to hold prior to her surgery. She understands this wasn't requested by the doctor's office but she would like to know for her own knowledge.

## 2022-07-06 NOTE — Telephone Encounter (Signed)
Patient  wanted to be certain she can take farxiga before surgery. Explained that she has already been cleared by cardiology and to follow the directions she was given related to aspirin. She wanted me to ask Dr. Gardiner Rhyme. Dr. Gardiner Rhyme stated patient can take farxiga and will not have to hold it for surgery. Called patient and informed her. She verbalized understanding.

## 2022-07-15 DIAGNOSIS — S93492A Sprain of other ligament of left ankle, initial encounter: Secondary | ICD-10-CM | POA: Diagnosis not present

## 2022-07-15 DIAGNOSIS — G8918 Other acute postprocedural pain: Secondary | ICD-10-CM | POA: Diagnosis not present

## 2022-07-15 DIAGNOSIS — S93612A Sprain of tarsal ligament of left foot, initial encounter: Secondary | ICD-10-CM | POA: Diagnosis not present

## 2022-07-15 DIAGNOSIS — M2142 Flat foot [pes planus] (acquired), left foot: Secondary | ICD-10-CM | POA: Diagnosis not present

## 2022-07-15 DIAGNOSIS — M21072 Valgus deformity, not elsewhere classified, left ankle: Secondary | ICD-10-CM | POA: Diagnosis not present

## 2022-07-15 DIAGNOSIS — M67972 Unspecified disorder of synovium and tendon, left ankle and foot: Secondary | ICD-10-CM | POA: Diagnosis not present

## 2022-07-15 DIAGNOSIS — M67874 Other specified disorders of tendon, left ankle and foot: Secondary | ICD-10-CM | POA: Diagnosis not present

## 2022-07-28 ENCOUNTER — Other Ambulatory Visit: Payer: Self-pay | Admitting: Cardiology

## 2022-07-29 ENCOUNTER — Other Ambulatory Visit: Payer: Self-pay | Admitting: *Deleted

## 2022-07-29 MED ORDER — ROSUVASTATIN CALCIUM 10 MG PO TABS: 10.0000 mg | ORAL_TABLET | ORAL | 3 refills | Status: DC

## 2022-08-29 ENCOUNTER — Other Ambulatory Visit: Payer: Self-pay | Admitting: Cardiology

## 2022-08-30 ENCOUNTER — Ambulatory Visit: Payer: Medicare Other

## 2022-09-21 DIAGNOSIS — M25672 Stiffness of left ankle, not elsewhere classified: Secondary | ICD-10-CM | POA: Diagnosis not present

## 2022-09-21 DIAGNOSIS — R531 Weakness: Secondary | ICD-10-CM | POA: Diagnosis not present

## 2022-09-23 DIAGNOSIS — M25672 Stiffness of left ankle, not elsewhere classified: Secondary | ICD-10-CM | POA: Diagnosis not present

## 2022-09-23 DIAGNOSIS — R531 Weakness: Secondary | ICD-10-CM | POA: Diagnosis not present

## 2022-09-27 ENCOUNTER — Ambulatory Visit: Payer: Medicare Other | Attending: General Surgery

## 2022-09-27 DIAGNOSIS — Z483 Aftercare following surgery for neoplasm: Secondary | ICD-10-CM

## 2022-09-27 NOTE — Therapy (Signed)
OUTPATIENT PHYSICAL THERAPY [facility] SCREENING NOTE   Patient Name: Claudia Ortiz MRN: [id number] DOB:1940/12/20, 82 y.o., female Today's Date: 09/27/2022  PCP: Kelton Pillar, MD REFERRING PROVIDER: Rolm Bookbinder, MD   PT End of Session - 09/27/22 1044     Visit Number 1   #  unchanged due to screen only   PT Start Time 1010    PT Stop Time 1014    PT Time Calculation (min) 4 min    Activity Tolerance Patient tolerated treatment well    Behavior During Therapy [facility] for tasks assessed/performed             Past Medical History:  Diagnosis Date   Anxiety    Arthritis 2019   Asthma    As Child   Cancer ([place]) 05/2020   R breast   Colon polyp    Elevated cholesterol    Intraductal papilloma of left breast     precancerous cells in R breast   Tricuspid valve mass    on 11/21/20 echo   Past Surgical History:  Procedure Laterality Date   AXILLARY SENTINEL NODE BIOPSY Right 05/12/2021   Procedure: AXILLARY SENTINEL NODE BIOPSY WITH MAGTRACE;  Surgeon: Rolm Bookbinder, MD;  Location: [place];  Service: General;  Laterality: Right;   BREAST LUMPECTOMY WITH RADIOACTIVE SEED AND SENTINEL LYMPH NODE BIOPSY Right 05/12/2021   Procedure: RIGHT BREAST LUMPECTOMY WITH RADIOACTIVE SEED X3;  Surgeon: Rolm Bookbinder, MD;  Location: [place];  Service: General;  Laterality: Right;   [place] Right 03/11/2022   Procedure: RIGHT THUMB TRAPEZIECTOMY AND SUSPENSIONPLASTY;  Surgeon: Leanora Cover, MD;  Location: [place];  Service: Orthopedics;  Laterality: Right;   colon polyp     removed   CYST REMOVAL TRUNK     breast    EYE SURGERY Left    cataract removal   FOOT SURGERY     PORTACATH PLACEMENT Left 09/02/2020   Procedure: INSERTION PORT-A-CATH;  Surgeon: Rolm Bookbinder, MD;  Location: [place];  Service: General;  Laterality: Left;   TEE WITHOUT CARDIOVERSION N/A 01/01/2021   Procedure: TRANSESOPHAGEAL  ECHOCARDIOGRAM (TEE);  Surgeon: Elouise Munroe, MD;  Location: [place];  Service: Cardiology;  Laterality: N/A;   TENDON TRANSFER Right 03/11/2022   Procedure: TENDON TRANSFER;  Surgeon: Leanora Cover, MD;  Location: [place];  Service: Orthopedics;  Laterality: Right;   TONSILLECTOMY     as a child   Patient Active Problem List   Diagnosis Date Noted   HFrEF (heart failure with reduced ejection fraction) ([place]) 07/03/2021   Tricuspid valve mass    Port-A-Cath in place 09/10/2020   Malignant neoplasm of lower-outer quadrant of right breast of female, estrogen receptor positive ([place]) 07/14/2020   Dyspnea 02/18/2014   Fatigue 02/18/2014   Abnormal ECG 02/18/2014   History of left breast cancer 02/18/2014   Hyperlipidemia 02/18/2014    REFERRING DIAG: right breast cancer at risk for lymphedema  THERAPY DIAG: Aftercare following surgery for neoplasm  PERTINENT HISTORY: Patient was diagnosed on 06/17/2020 with right grade III invasive ductal carcinoma breast cancer. She underwent neoadjuvant chemotherapy 09/03/2020 - 11/19/2020 followed by a right lumpectomy and sentinel node biopsy (3 negative nodes) on 10/25/202. It is ER positive, PR negative and HER2 positive with a Ki67 of 40%.   PRECAUTIONS: right UE Lymphedema risk  SUBJECTIVE: Here for [facility] screen; doing well. "I've had ankle surgery since I was here last."   PAIN:  Are you  having pain? No  [facility] SCREENING:  We will be unable to cont SOZO screenings for Ms. Saulters at this time due to recent ankle reconstruction leaving her with metal in her foot/ankle. This will affect the scores so we will D/C her from [place] and she was instructed in how to self monitor her affected limb for any changes by being aware of initial symptoms being a feeling not a seeing. She could feel heaviness or achiness which could lead to then seeing swelling in her distal limb. Pt was able to verbalize good understanding and knows she can  call us with any questions or concern.      Collie Siad, PTA 09/27/22 10:48 AM

## 2022-09-28 ENCOUNTER — Other Ambulatory Visit: Payer: Self-pay | Admitting: Cardiology

## 2022-09-28 DIAGNOSIS — M25672 Stiffness of left ankle, not elsewhere classified: Secondary | ICD-10-CM | POA: Diagnosis not present

## 2022-09-28 DIAGNOSIS — R531 Weakness: Secondary | ICD-10-CM | POA: Diagnosis not present

## 2022-09-30 DIAGNOSIS — M25672 Stiffness of left ankle, not elsewhere classified: Secondary | ICD-10-CM | POA: Diagnosis not present

## 2022-09-30 DIAGNOSIS — R531 Weakness: Secondary | ICD-10-CM | POA: Diagnosis not present

## 2022-10-01 DIAGNOSIS — M25672 Stiffness of left ankle, not elsewhere classified: Secondary | ICD-10-CM | POA: Diagnosis not present

## 2022-10-05 IMAGING — CT CT CARDIAC CORONARY ARTERY CALCIUM SCORE
1 of 2 series · 8 of 20 positions shown, 10 images · non-contrast
Comparison: None Available.
COMPARISON: None Available.

Addendum:
EXAM:
OVER-READ INTERPRETATION  CT CHEST

The following report is a limited chest CT over-read performed by
01/07/2022. The calcium score interpretation by the cardiologist is
attached.
CLINICAL DATA: Cardiovascular Disease Risk stratification
Coronary Calcium Score
TECHNIQUE: A gated, non-contrast computed tomography scan of the heart was
performed using 3mm slice thickness. Axial images were analyzed on a
dedicated workstation. Calcium scoring of the coronary arteries was
performed using the Agatston method.

[Series 3: 2 soft full fov · axial · 0.74mm/px · z∈[+1523,+1640]mm · 8 of 51 slices shown, 10 images]
[im 6/51  vessel]
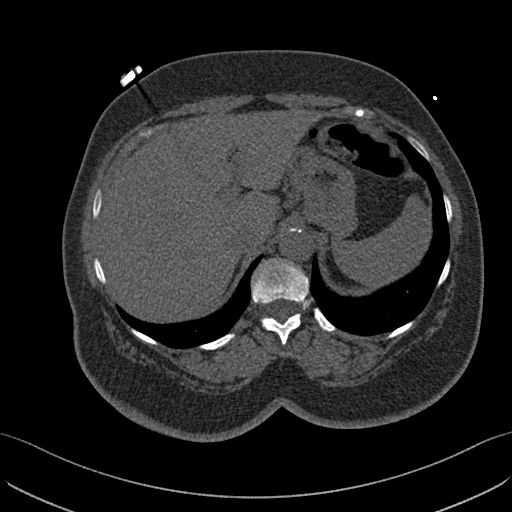
[im 6/51  lung]
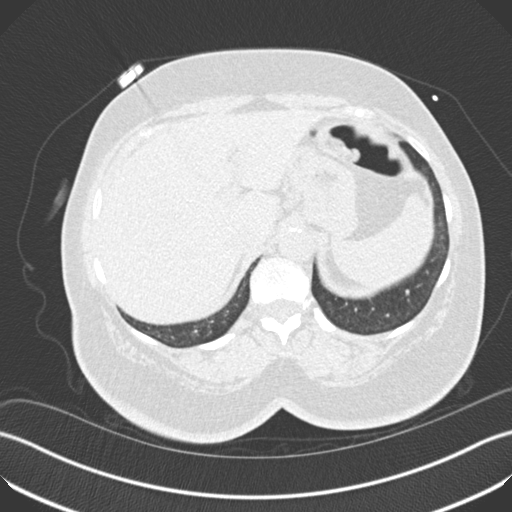
[im 12/51  vessel]
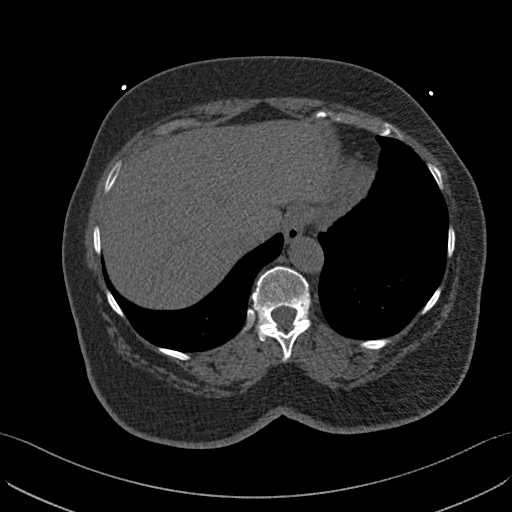
[im 17/51  vessel]
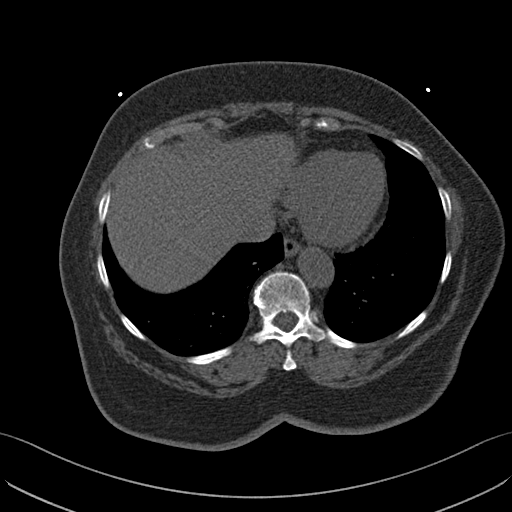
[im 23/51  vessel]
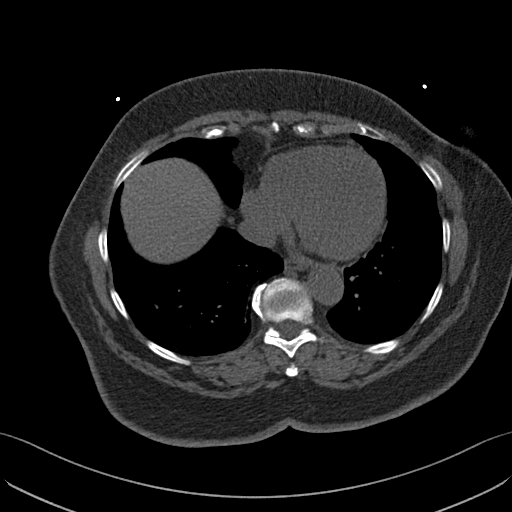
[im 28/51  vessel]
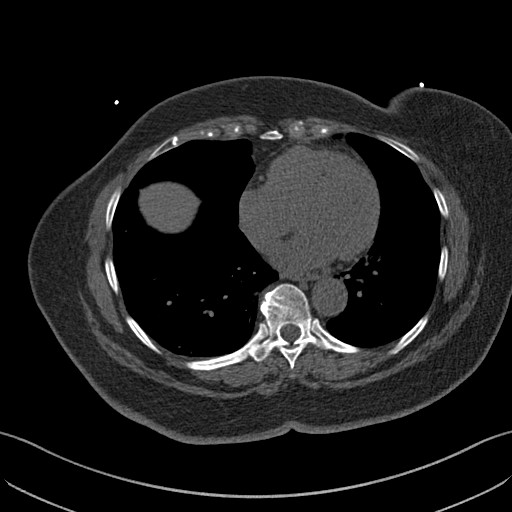
[im 28/51  lung]
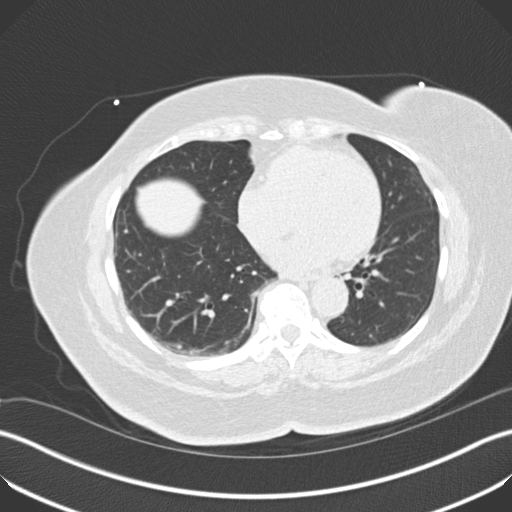
[im 34/51  vessel]
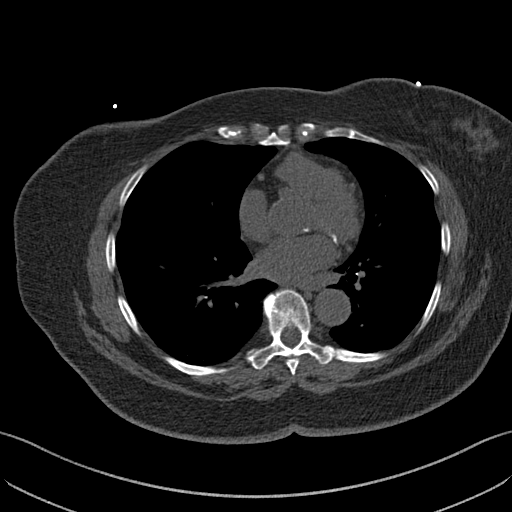
[im 39/51  vessel]
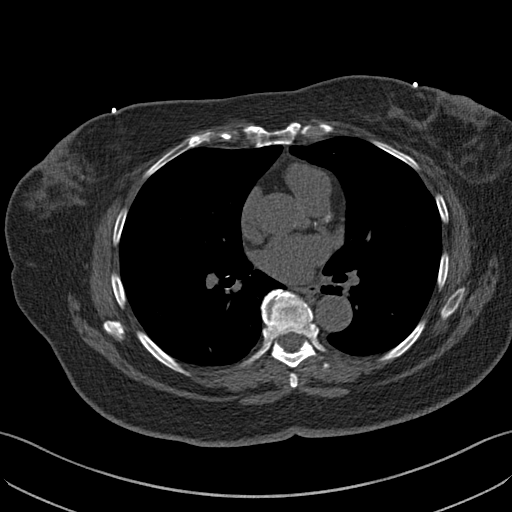
[im 45/51  vessel]
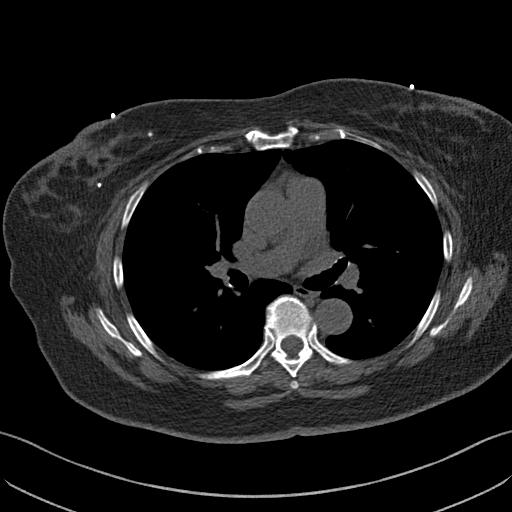

[8 of 20 positions shown; findings below may reference images not displayed]

FINDINGS: Limited view of the lung parenchyma demonstrates no suspicious
nodularity. Airways are normal.

Limited view of the mediastinum demonstrates no adenopathy.
Esophagus normal.

Limited view of the upper abdomen unremarkable.

Limited view of the skeleton and chest wall is unremarkable.
IMPRESSION: No significant extracardiac findings.
FINDINGS: Coronary arteries: Normal origins.

Coronary Calcium Score:

Left main: 16

Left anterior descending artery: 95

Left circumflex artery: 35

Right coronary artery: 4

Total: 150

Percentile: 55

Pericardium: Normal.

Aorta: Normal caliber of ascending aorta. Aortic atherosclerosis
noted.

Mitral annular calcification.

Non-cardiac: See separate report from [REDACTED].
IMPRESSION: Coronary calcium score of 150. This was 55th percentile for age-,
race-, and sex-matched controls. Aortic atherosclerosis.



If CAC=0, it is reasonable to withhold statin therapy and reassess
in 5 to 10 years, as long as higher risk conditions are absent
(diabetes mellitus, family history of premature CHD in first degree
relatives (males <55 years; females <65 years), cigarette smoking,
or LDL >=190 mg/dL).

If CAC is 1 to 99, it is reasonable to initiate statin therapy for
patients >=55 years of age.

If CAC is >=100 or >=75th percentile, it is reasonable to initiate
statin therapy at any age.

Cardiology referral should be considered for patients with CAC
scores >=400 or >=75th percentile.

*4163 AHA/ACC/AACVPR/AAPA/ABC/JINTU/TAPICERSTWO MEBLOWE/ZIWADESIGN/Gabrielaitis/BJORN/NISHIKAWA/SIMA
Guideline on the Management of Blood Cholesterol: A Report of the
American College of Cardiology/American Heart Association Task Force
on Clinical Practice Guidelines. J Am Coll Cardiol.
0023;73(24):2099-2179.

*** End of Addendum ***
EXAM:
OVER-READ INTERPRETATION  CT CHEST

The following report is a limited chest CT over-read performed by
01/07/2022. The calcium score interpretation by the cardiologist is
attached.
FINDINGS: Limited view of the lung parenchyma demonstrates no suspicious
nodularity. Airways are normal.

Limited view of the mediastinum demonstrates no adenopathy.
Esophagus normal.

Limited view of the upper abdomen unremarkable.

Limited view of the skeleton and chest wall is unremarkable.
IMPRESSION: No significant extracardiac findings.

## 2022-10-06 ENCOUNTER — Ambulatory Visit
Admission: RE | Admit: 2022-10-06 | Discharge: 2022-10-06 | Disposition: A | Discharge status: Home / Self Care | Payer: Medicare Other | Source: Ambulatory Visit | Attending: Hematology and Oncology | Admitting: Hematology and Oncology

## 2022-10-06 DIAGNOSIS — R922 Inconclusive mammogram: Secondary | ICD-10-CM | POA: Diagnosis not present

## 2022-10-06 DIAGNOSIS — C50511 Malignant neoplasm of lower-outer quadrant of right female breast: Secondary | ICD-10-CM

## 2022-10-06 DIAGNOSIS — Z853 Personal history of malignant neoplasm of breast: Secondary | ICD-10-CM | POA: Diagnosis not present

## 2022-10-06 HISTORY — DX: Personal history of antineoplastic chemotherapy: Z92.21

## 2022-10-11 ENCOUNTER — Ambulatory Visit: Payer: Medicare Other

## 2022-10-12 ENCOUNTER — Telehealth: Payer: Self-pay | Admitting: *Deleted

## 2022-10-12 NOTE — Telephone Encounter (Signed)
Received call from pt stating she has found an error with recent mammogram report.  Mammogram states pt underwent surgery in 2002 when lumpectomy occurred in 2022.  RN reached out to [place] with GI to get information updated.

## 2022-10-22 ENCOUNTER — Other Ambulatory Visit: Payer: Self-pay | Admitting: Cardiology

## 2022-10-28 DIAGNOSIS — C50919 Malignant neoplasm of unspecified site of unspecified female breast: Secondary | ICD-10-CM | POA: Diagnosis not present

## 2022-10-28 DIAGNOSIS — I078 Other rheumatic tricuspid valve diseases: Secondary | ICD-10-CM | POA: Diagnosis not present

## 2022-10-28 DIAGNOSIS — I5042 Chronic combined systolic (congestive) and diastolic (congestive) heart failure: Secondary | ICD-10-CM | POA: Diagnosis not present

## 2022-10-28 DIAGNOSIS — R42 Dizziness and giddiness: Secondary | ICD-10-CM | POA: Diagnosis not present

## 2022-10-30 ENCOUNTER — Other Ambulatory Visit: Payer: Self-pay | Admitting: Adult Health

## 2022-10-30 DIAGNOSIS — Z170 Estrogen receptor positive status [ER+]: Secondary | ICD-10-CM

## 2022-11-02 DIAGNOSIS — H8113 Benign paroxysmal vertigo, bilateral: Secondary | ICD-10-CM | POA: Diagnosis not present

## 2022-11-04 ENCOUNTER — Telehealth: Payer: Self-pay | Admitting: Hematology and Oncology

## 2022-11-04 NOTE — Telephone Encounter (Signed)
Scheduled appointment per staff message. Patient is aware of the made appointment. 

## 2022-11-10 DIAGNOSIS — H8113 Benign paroxysmal vertigo, bilateral: Secondary | ICD-10-CM | POA: Diagnosis not present

## 2022-11-16 ENCOUNTER — Telehealth: Payer: Self-pay | Admitting: Cardiology

## 2022-11-16 NOTE — Telephone Encounter (Signed)
Received a call from [facility] and was informed the patient was in the chart and needed to have a root canal. She states the patient is in a lot of pain.  Dentist got on the phone, did not state her name, but asked repeatedly does the patient need an antibiotic. I informed the dentist that I need more information about the procedure and simple clearance information (her name, phone and fax number name of procedure and if any anesthesia will be used) to be able to pass it on the the provider. I explained that I am not the provider. I advised her that I can pass the information on the the provider, but I will need the keep details. She then said well let me speak with the provider. I told her the provider could give her a call. She then said are you going to let me speak with the provider or not. I stated I can pass the message along. She said " I just need to know if the patient needs an antibiotic or not." She said I'm a specialist not the patients dentist and I was informed by the dentist that the patient had to be on antibiotics. She said the patient is in a lot of pain and the procedure is going to be done today. She states the appointment was scheduled last week. I then asked if why they did not contact your office sooner. She then stated again she is just the specialist. She states she will just give the patient an antibiotic and hung up.     I had to google the phone number.

## 2022-11-16 NOTE — Telephone Encounter (Signed)
   Patient Name: Claudia Ortiz  DOB: 12-23-1940 MRN: [id number]  Primary Cardiologist: Little Ishikawa, MD  Chart reviewed as part of pre-operative protocol coverage.    SBE prophylaxis is not required for the patient from a cardiac standpoint.  Patient was given antibiotic by performing provider and recommendations will be placed in chart for future reference.  Please call with questions.  Napoleon Form, Leodis Rains, NP 11/16/2022, 10:51 AM

## 2022-11-16 NOTE — Telephone Encounter (Signed)
Ally from [facility] was calling with questions about the patient. Transferred the call to the Pre-op team.

## 2022-11-24 ENCOUNTER — Other Ambulatory Visit: Payer: Self-pay | Admitting: Cardiology

## 2022-12-07 ENCOUNTER — Telehealth: Payer: Self-pay | Admitting: *Deleted

## 2022-12-07 NOTE — Telephone Encounter (Signed)
Received fax from [facility] requesting new rx for Farxiga.   RX faxed.

## 2022-12-28 ENCOUNTER — Telehealth: Payer: Self-pay | Admitting: *Deleted

## 2022-12-28 NOTE — Telephone Encounter (Signed)
Received call from pt with complaint of right discomfort.  Pt denies recent injury, trauma or swelling.  Pt requesting office visit for breast exam and further evaluation.  [facility] visit scheduled, pt verbalized understanding of appt details.

## 2023-01-04 ENCOUNTER — Inpatient Hospital Stay: Payer: Medicare Other | Attending: Adult Health | Admitting: Adult Health

## 2023-01-04 ENCOUNTER — Encounter: Payer: Self-pay | Admitting: Adult Health

## 2023-01-04 VITALS — BP 112/66 | HR 77 | Temp 97.8°F | Ht 65.35 in | Wt 192.6 lb

## 2023-01-04 DIAGNOSIS — Z823 Family history of stroke: Secondary | ICD-10-CM | POA: Diagnosis not present

## 2023-01-04 DIAGNOSIS — C50511 Malignant neoplasm of lower-outer quadrant of right female breast: Secondary | ICD-10-CM

## 2023-01-04 DIAGNOSIS — Z79899 Other long term (current) drug therapy: Secondary | ICD-10-CM | POA: Diagnosis not present

## 2023-01-04 DIAGNOSIS — Z8249 Family history of ischemic heart disease and other diseases of the circulatory system: Secondary | ICD-10-CM | POA: Diagnosis not present

## 2023-01-04 DIAGNOSIS — Z79811 Long term (current) use of aromatase inhibitors: Secondary | ICD-10-CM | POA: Diagnosis not present

## 2023-01-04 DIAGNOSIS — Z833 Family history of diabetes mellitus: Secondary | ICD-10-CM | POA: Diagnosis not present

## 2023-01-04 DIAGNOSIS — Z8719 Personal history of other diseases of the digestive system: Secondary | ICD-10-CM | POA: Diagnosis not present

## 2023-01-04 DIAGNOSIS — Z170 Estrogen receptor positive status [ER+]: Secondary | ICD-10-CM | POA: Diagnosis not present

## 2023-01-04 NOTE — Patient Instructions (Signed)
Breast Tenderness Breast tenderness is a common problem for women of all ages, but may also occur in men. Breast tenderness has many possible causes, including hormone changes, infections, taking certain medicines, and caffeine intake. In women, the pain usually comes and goes with the menstrual cycle, but it can also be constant. Breast tenderness may range from mild discomfort to severe pain. You may have tests, such as a mammogram or an ultrasound, to check for any unusual findings. Having breast tenderness usually does not mean that you have breast cancer. Follow these instructions at home: Managing pain and discomfort  If directed, put ice on the painful area. To do this: Put ice in a plastic bag. Place a towel between your skin and the bag. Leave the ice on for 20 minutes, 2-3 times a day. If your skin turns bright red, remove the ice right away to prevent skin damage. The risk of skin damage is higher if you cannot feel pain, heat, or cold. Wear a supportive bra or chest support: During exercise. While sleeping, if your breasts are very tender. Medicines Take over-the-counter and prescription medicines only as told by your health care provider. If the cause of your pain is an infection, you may be prescribed an antibiotic medicine. If you were prescribed antibiotics, take them as told by your health care provider. Do not stop using the antibiotic even if you start to feel better. Eating and drinking Decrease the amount of caffeine in your diet. Instead, drink more water and choose caffeine-free drinks. Your health care provider may recommend that you lessen the amount of fat in your diet. You can do this by: Limiting fried foods. Cooking foods using methods such as baking, boiling, grilling, and broiling. General instructions  Keep a log of the days and times when your breasts are most tender. Ask your health care provider how to do breast exams at home. This will help you notice if  you have an unusual growth or lump. Keep all follow-up visits. Contact a health care provider if: Any part of your breast is hard, red, and hot to the touch. This may be a sign of infection. You are a woman and have a new or painful lump in your breast that remains after your menstrual period ends. You are not breastfeeding and you have fluid, especially blood or pus, coming out of your nipples. You have a fever. Your pain does not improve or it gets worse. Your pain is interfering with your daily activities. Summary Breast tenderness may range from mild discomfort to severe pain. Breast tenderness has many possible causes, including hormone changes, infections, taking certain medicines, and caffeine intake. It can be treated with ice, wearing a supportive bra or chest support, and medicines. Make changes to your diet as told by your health care provider. This information is not intended to replace advice given to you by your health care provider. Make sure you discuss any questions you have with your health care provider. Document Revised: 09/16/2021 Document Reviewed: 09/16/2021 Elsevier Patient Education  2024 Elsevier Inc.  

## 2023-01-04 NOTE — Assessment & Plan Note (Signed)
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  Claudia Ortiz is here today for follow-up of urgent evaluation of right breast tenderness.  I did a thorough exam today and find no abnormality in either of her breasts.    I gave her a handout on breast tenderness and we discussed decreasing fat content in her foods along with decreasing caffeine.  I recommended that she work on these dietary modifications and if she develops any new nodule mass or can pinpoint an area of breast discomfort to let me know and we can order mammogram and ultrasound.  I also asked that she let us know if this discomfort does not improve despite the dietary modifications.  She may benefit from PT referral if that is the case.  She will follow-up with Dr. Pamelia Hoit in August 2024.  She knows to call for questions or concerns in the interim.

## 2023-01-04 NOTE — Progress Notes (Signed)
Cancer Center Cancer Follow up:    Claudia Small, MD (Inactive) [address] [place] [address]   DIAGNOSIS:  Cancer Staging  Malignant neoplasm of lower-outer quadrant of right breast of female, estrogen receptor positive (HCC) Staging form: Breast, AJCC 8th Edition - Clinical stage from 07/23/2020: Stage IA (cT1c, cN0, cM0, G3, ER+, PR-, HER2+) - Unsigned Stage prefix: Initial diagnosis Method of lymph node assessment: Clinical   SUMMARY OF ONCOLOGIC HISTORY: Oncology History  Malignant neoplasm of lower-outer quadrant of right breast of female, estrogen receptor positive (HCC)  07/14/2020 Initial Diagnosis   Screening mammogram showed a 1.5cm right breast mass. US showed the 1.4cm mass at the 7 o'clock position, a 2.7cm mass at the 9 o'clock position, and benign right axillary lymph nodes. Biopsy showed ALH at the 9 o'clock position, and at the 7 o'clock position, IDC, grade 3, HER-2 positive (3+), ER+ 20% weak, PR- 0%, Ki67 40%.   09/03/2020 - 11/19/2020 Neo-Adjuvant Chemotherapy   Weekly Taxol (dose reduced) and Herceptin x 12   12/10/2020 - 03/25/2021 Chemotherapy   Patient is on Treatment Plan : BREAST Trastuzumab q21d      05/12/2021 Surgery   Right lumpectomy: LCIS involving CSL, intraductal papilloma negative for IDC or DCIS Right lateral margin lumpectomy: LCIS, ADH Lymph nodes: 3 negative    08/2021 -  Anti-estrogen oral therapy   Anastrozole daily     CURRENT THERAPY: Anastrozole  INTERVAL HISTORY: Claudia Ortiz 82 y.o. female returns for urgent evaluation of right breast discomfort.  She has noticed this at an increasing frequency over the past couple of weeks and is uncertain what it could be related to.  She denies any focal area of tenderness, warmth, redness, fever or chills, or swelling.  She denies any nodularity or swollen lymph nodes.  Her most recent mammogram occurred on October 06, 2022 demonstrating no evidence of  malignancy and breast density category C.   Patient Active Problem List   Diagnosis Date Noted   HFrEF (heart failure with reduced ejection fraction) (HCC) 07/03/2021   Tricuspid valve mass    Port-A-Cath in place 09/10/2020   Malignant neoplasm of lower-outer quadrant of right breast of female, estrogen receptor positive (HCC) 07/14/2020   Dyspnea 02/18/2014   Fatigue 02/18/2014   Abnormal ECG 02/18/2014   History of left breast cancer 02/18/2014   Hyperlipidemia 02/18/2014    is allergic to iodine, shellfish allergy, atorvastatin, cephalosporins, and penicillins.  MEDICAL HISTORY: Past Medical History:  Diagnosis Date   Anxiety    Arthritis 2019   Asthma    As Child   Cancer (HCC) 05/2020   R breast   Colon polyp    Elevated cholesterol    Intraductal papilloma of left breast     precancerous cells in R breast   Personal history of chemotherapy    Tricuspid valve mass    on 11/21/20 echo    SURGICAL HISTORY: Past Surgical History:  Procedure Laterality Date   AXILLARY SENTINEL NODE BIOPSY Right 05/12/2021   Procedure: AXILLARY SENTINEL NODE BIOPSY WITH MAGTRACE;  Surgeon: Emelia Loron, MD;  Location: [facility] OR;  Service: General;  Laterality: Right;   BREAST LUMPECTOMY Right 05/12/2021   BREAST LUMPECTOMY WITH RADIOACTIVE SEED AND SENTINEL LYMPH NODE BIOPSY Right 05/12/2021   Procedure: RIGHT BREAST LUMPECTOMY WITH RADIOACTIVE SEED X3;  Surgeon: Emelia Loron, MD;  Location: [facility] OR;  Service: General;  Laterality: Right;   CARPOMETACARPEL SUSPENSION PLASTY Right 03/11/2022  Procedure: RIGHT THUMB TRAPEZIECTOMY AND SUSPENSIONPLASTY;  Surgeon: Betha Loa, MD;  Location: [facility];  Service: Orthopedics;  Laterality: Right;   colon polyp     removed   CYST REMOVAL TRUNK     breast    EYE SURGERY Left    cataract removal   FOOT SURGERY     PORTACATH PLACEMENT Left 09/02/2020   Procedure: INSERTION PORT-A-CATH;  Surgeon: Emelia Loron,  MD;  Location: [facility];  Service: General;  Laterality: Left;   TEE WITHOUT CARDIOVERSION N/A 01/01/2021   Procedure: TRANSESOPHAGEAL ECHOCARDIOGRAM (TEE);  Surgeon: Parke Poisson, MD;  Location: [facility] ENDOSCOPY;  Service: Cardiology;  Laterality: N/A;   TENDON TRANSFER Right 03/11/2022   Procedure: TENDON TRANSFER;  Surgeon: Betha Loa, MD;  Location: [facility];  Service: Orthopedics;  Laterality: Right;   TONSILLECTOMY     as a child    SOCIAL HISTORY: Social History   Socioeconomic History   Marital status: Married    Spouse name: Not on file   Number of children: Not on file   Years of education: Not on file   Highest education level: Not on file  Occupational History   Not on file  Tobacco Use   Smoking status: Never   Smokeless tobacco: Never  Vaping Use   Vaping Use: Never used  Substance and Sexual Activity   Alcohol use: No   Drug use: No   Sexual activity: Yes  Other Topics Concern   Not on file  Social History Narrative   Not on file   Social Determinants of Health   Financial Resource Strain: Not on file  Food Insecurity: Not on file  Transportation Needs: Not on file  Physical Activity: Not on file  Stress: Not on file  Social Connections: Not on file  Intimate Partner Violence: Not on file    FAMILY HISTORY: Family History  Problem Relation Age of Onset   Diabetes Mother    Heart attack Mother    Stroke Father    Heart disease Father    Heart attack Brother     Review of Systems  Constitutional:  Negative for appetite change, chills, fatigue, fever and unexpected weight change.  HENT:   Negative for hearing loss, lump/mass and trouble swallowing.   Eyes:  Negative for eye problems and icterus.  Respiratory:  Negative for chest tightness, cough and shortness of breath.   Cardiovascular:  Negative for chest pain, leg swelling and palpitations.  Gastrointestinal:  Negative for abdominal distention, abdominal  pain, constipation, diarrhea, nausea and vomiting.  Endocrine: Negative for hot flashes.  Genitourinary:  Negative for difficulty urinating.   Musculoskeletal:  Negative for arthralgias.  Skin:  Negative for itching and rash.  Neurological:  Negative for dizziness, extremity weakness, headaches and numbness.  Hematological:  Negative for adenopathy. Does not bruise/bleed easily.  Psychiatric/Behavioral:  Negative for depression. The patient is not nervous/anxious.       PHYSICAL EXAMINATION   Onc Performance Status - 01/04/23 1049       ECOG Perf Status   ECOG Perf Status Fully active, able to carry on all pre-disease performance without restriction      KPS SCALE   KPS % SCORE Able to carry on normal activity, minor s/s of disease             Vitals:   01/04/23 1034  BP: 112/66  Pulse: 77  Temp: 97.8 F (36.6 C)  SpO2: 97%  Physical Exam Constitutional:      General: She is not in acute distress.    Appearance: Normal appearance. She is not ill-appearing.  HENT:     Head: Normocephalic and atraumatic.  Eyes:     General: No scleral icterus. Chest:     Comments: Both axillae are benign.  Left breast is benign right breast status postlumpectomy no nodule mass, swelling, focal tenderness, or sign of breast cancer recurrence. Skin:    General: Skin is warm and dry.     Findings: No rash.  Neurological:     General: No focal deficit present.     Mental Status: She is alert.  Psychiatric:        Mood and Affect: Mood normal.        Behavior: Behavior normal.    LABORATORY DATA:  CBC    Component Value Date/Time   WBC 5.9 05/27/2021 1422   WBC 5.7 05/05/2021 1530   RBC 4.60 05/27/2021 1422   HGB 12.1 05/27/2021 1422   HGB 12.0 12/29/2020 0853   HGB 12.6 07/03/2009 1412   HCT 37.3 05/27/2021 1422   HCT 35.3 12/29/2020 0853   HCT 39.5 07/03/2009 1412   PLT 208 05/27/2021 1422   PLT 195 12/29/2020 0853   MCV 81.1 05/27/2021 1422   MCV 82 12/29/2020  0853   MCV 84 07/03/2009 1412   MCH 26.3 05/27/2021 1422   MCHC 32.4 05/27/2021 1422   RDW 14.6 05/27/2021 1422   RDW 12.9 12/29/2020 0853   RDW 12.7 07/03/2009 1412   LYMPHSABS 2.0 05/27/2021 1422   LYMPHSABS 2.6 07/03/2009 1412   MONOABS 0.4 05/27/2021 1422   EOSABS 0.2 05/27/2021 1422   EOSABS 0.2 07/03/2009 1412   BASOSABS 0.1 05/27/2021 1422   BASOSABS 0.0 07/03/2009 1412    CMP     Component Value Date/Time   NA 142 05/11/2022 1544   NA 144 07/03/2009 1412   K 4.5 05/11/2022 1544   K 3.7 07/03/2009 1412   CL 103 05/11/2022 1544   CL 104 07/03/2009 1412   CO2 22 05/11/2022 1544   CO2 31 07/03/2009 1412   GLUCOSE 90 05/11/2022 1544   GLUCOSE 105 (H) 05/27/2021 1422   GLUCOSE 119 (H) 07/03/2009 1412   BUN 16 05/11/2022 1544   BUN 16 07/03/2009 1412   CREATININE 0.93 05/11/2022 1544   CREATININE 0.94 05/27/2021 1422   CREATININE 0.7 07/03/2009 1412   CALCIUM 10.2 05/11/2022 1544   CALCIUM 9.5 07/03/2009 1412   PROT 6.7 05/27/2021 1422   PROT 7.0 07/03/2009 1412   ALBUMIN 3.6 05/27/2021 1422   ALBUMIN 3.5 07/03/2009 1412   AST 23 05/27/2021 1422   ALT 18 05/27/2021 1422   ALT 42 07/03/2009 1412   ALKPHOS 106 05/27/2021 1422   ALKPHOS 92 (H) 07/03/2009 1412   BILITOT 0.4 05/27/2021 1422   GFRNONAA >60 05/27/2021 1422   GFRAA  05/02/2008 1405    >60        The eGFR has been calculated using the MDRD equation. 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  Claudia Ortiz is  here today for follow-up of urgent evaluation of  right breast tenderness.  I did a thorough exam today and find no abnormality in either of her breasts.    I gave her a handout on breast tenderness and we discussed decreasing fat content in her foods along with decreasing caffeine.  I recommended that she work on these dietary modifications and if she develops any new nodule mass or can pinpoint an area of breast discomfort to let me know and we can order mammogram and ultrasound.  I also asked that she let us know if this discomfort does not improve despite the dietary modifications.  She may benefit from PT referral if that is the case.  She will follow-up with Dr. Pamelia Hoit in August 2024.  She knows to call for questions or concerns in the interim.   All questions were answered. The patient knows to call the clinic with any problems, questions or concerns. We can certainly see the patient much sooner if necessary.  Total encounter time:20 minutes*in face-to-face visit time, chart review, lab review, care coordination, order entry, and documentation of the encounter time.    Lillard Anes, NP 01/04/23 11:19 AM Medical Oncology and Hematology [facility] [address] [place], [place] [address] Tel. [phone]    Fax. [phone]  *Total Encounter Time as defined by the Centers for Medicare and Medicaid Services includes, in addition to the face-to-face time of a patient visit (documented in the note above) non-face-to-face time: obtaining and reviewing outside history, ordering and reviewing medications, tests or procedures, care coordination (communications with other health care professionals or caregivers) and documentation in the medical record.

## 2023-01-25 ENCOUNTER — Other Ambulatory Visit: Payer: Self-pay | Admitting: Hematology and Oncology

## 2023-01-25 DIAGNOSIS — Z170 Estrogen receptor positive status [ER+]: Secondary | ICD-10-CM

## 2023-02-14 DIAGNOSIS — C50511 Malignant neoplasm of lower-outer quadrant of right female breast: Secondary | ICD-10-CM | POA: Diagnosis not present

## 2023-02-14 DIAGNOSIS — Z170 Estrogen receptor positive status [ER+]: Secondary | ICD-10-CM | POA: Diagnosis not present

## 2023-02-17 ENCOUNTER — Encounter: Payer: Self-pay | Admitting: Rehabilitation

## 2023-02-17 ENCOUNTER — Other Ambulatory Visit: Payer: Self-pay

## 2023-02-17 ENCOUNTER — Ambulatory Visit: Payer: Medicare Other | Attending: General Surgery | Admitting: Rehabilitation

## 2023-02-17 DIAGNOSIS — R293 Abnormal posture: Secondary | ICD-10-CM | POA: Diagnosis not present

## 2023-02-17 DIAGNOSIS — Z483 Aftercare following surgery for neoplasm: Secondary | ICD-10-CM | POA: Insufficient documentation

## 2023-02-17 DIAGNOSIS — M25611 Stiffness of right shoulder, not elsewhere classified: Secondary | ICD-10-CM | POA: Insufficient documentation

## 2023-02-17 DIAGNOSIS — C50511 Malignant neoplasm of lower-outer quadrant of right female breast: Secondary | ICD-10-CM | POA: Insufficient documentation

## 2023-02-17 DIAGNOSIS — Z170 Estrogen receptor positive status [ER+]: Secondary | ICD-10-CM | POA: Insufficient documentation

## 2023-02-17 NOTE — Therapy (Signed)
OUTPATIENT PHYSICAL THERAPY  UPPER EXTREMITY ONCOLOGY EVALUATION  Patient Name: Claudia Ortiz MRN: [id number] DOB:12/12/1940, 82 y.o., female Today's Date: 02/17/2023  END OF SESSION:  PT End of Session - 02/17/23 1202     Visit Number 1    Number of Visits 7    Date for PT Re-Evaluation 04/14/23    Authorization Type No auth needed    PT Start Time 1100    PT Stop Time 1150    PT Time Calculation (min) 50 min    Activity Tolerance Patient tolerated treatment well    Behavior During Therapy [facility] for tasks assessed/performed             Past Medical History:  Diagnosis Date   Anxiety    Arthritis 2019   Asthma    As Child   Cancer (HCC) 05/2020   R breast   Colon polyp    Elevated cholesterol    Intraductal papilloma of left breast     precancerous cells in R breast   Personal history of chemotherapy    Tricuspid valve mass    on 11/21/20 echo   Past Surgical History:  Procedure Laterality Date   AXILLARY SENTINEL NODE BIOPSY Right 05/12/2021   Procedure: AXILLARY SENTINEL NODE BIOPSY WITH MAGTRACE;  Surgeon: Emelia Loron, MD;  Location: [facility] OR;  Service: General;  Laterality: Right;   BREAST LUMPECTOMY Right 05/12/2021   BREAST LUMPECTOMY WITH RADIOACTIVE SEED AND SENTINEL LYMPH NODE BIOPSY Right 05/12/2021   Procedure: RIGHT BREAST LUMPECTOMY WITH RADIOACTIVE SEED X3;  Surgeon: Emelia Loron, MD;  Location: [facility] OR;  Service: General;  Laterality: Right;   CARPOMETACARPEL SUSPENSION PLASTY Right 03/11/2022   Procedure: RIGHT THUMB TRAPEZIECTOMY AND SUSPENSIONPLASTY;  Surgeon: Betha Loa, MD;  Location: [facility];  Service: Orthopedics;  Laterality: Right;   colon polyp     removed   CYST REMOVAL TRUNK     breast    EYE SURGERY Left    cataract removal   FOOT SURGERY     PORTACATH PLACEMENT Left 09/02/2020   Procedure: INSERTION PORT-A-CATH;  Surgeon: Emelia Loron, MD;  Location: [facility];  Service: General;   Laterality: Left;   TEE WITHOUT CARDIOVERSION N/A 01/01/2021   Procedure: TRANSESOPHAGEAL ECHOCARDIOGRAM (TEE);  Surgeon: Parke Poisson, MD;  Location: [facility] ENDOSCOPY;  Service: Cardiology;  Laterality: N/A;   TENDON TRANSFER Right 03/11/2022   Procedure: TENDON TRANSFER;  Surgeon: Betha Loa, MD;  Location: [facility];  Service: Orthopedics;  Laterality: Right;   TONSILLECTOMY     as a child   Patient Active Problem List   Diagnosis Date Noted   HFrEF (heart failure with reduced ejection fraction) (HCC) 07/03/2021   Tricuspid valve mass    Port-A-Cath in place 09/10/2020   Malignant neoplasm of lower-outer quadrant of right breast of female, estrogen receptor positive (HCC) 07/14/2020   Dyspnea 02/18/2014   Fatigue 02/18/2014   Abnormal ECG 02/18/2014   History of left breast cancer 02/18/2014   Hyperlipidemia 02/18/2014    PCP: [name], MD  REFERRING PROVIDER: Dr. Emelia Loron   REFERRING DIAG: Rt shoulder pain  THERAPY DIAG:  Aftercare following surgery for neoplasm  Malignant neoplasm of lower-outer quadrant of right breast of female, estrogen receptor positive (HCC)  Stiffness of right shoulder, not elsewhere classified  Abnormal posture  ONSET DATE: 01/17/23  Rationale for Evaluation and Treatment: Rehabilitation  SUBJECTIVE:  SUBJECTIVE STATEMENT: I started getting this tenderness in the Rt shoulder - the top and into the upper arm.  The neck may also be sore and feeling tight   PERTINENT HISTORY: Completed chemo, Rt lumpectomy with SLNB in 2022.  No radiation. Hx of Lt breast cancer in 2015.    PAIN:  Are you having pain? Yes NPRS scale: 3/10 Pain location: anterior shoulder  Pain orientation: Right  PAIN TYPE: aching Pain description: constant   Aggravating factors: reaching across the body; not avoiding any activities  Relieving factors: Tylenol  PRECAUTIONS: lymphedema risk  RED FLAGS: None   WEIGHT BEARING RESTRICTIONS: No  FALLS:  Has patient fallen in last 6 months? No  LIVING ENVIRONMENT: Lives with: lives with their spouse Lives in: House/apartment  OCCUPATION: Retired  LEISURE: not really    HAND DOMINANCE: right   PRIOR LEVEL OF FUNCTION: Independent  PATIENT GOALS: Decrease shoulder pain    OBJECTIVE:  COGNITION: Overall cognitive status: Within functional limits for tasks assessed   PALPATION: +1-2 ttp biceps tendon  OBSERVATIONS / OTHER ASSESSMENTS: rounded shoulders, forward head posture,   POSTURE: per above  UPPER EXTREMITY AROM/PROM:  A/PROM 2022 - baseline RIGHT   eval   Shoulder extension 50 50  Shoulder flexion 151 141   Shoulder abduction 168 130 - pain  Shoulder internal rotation    Shoulder external rotation 80 90    (Blank rows = not tested)  A/PROM LEFT   eval  Shoulder extension 50  Shoulder flexion 141  Shoulder abduction 145  Shoulder internal rotation   Shoulder external rotation 90    (Blank rows = not tested)  CERVICAL AROM: All within normal limits: Just tight   UPPER EXTREMITY STRENGTH: 4+/5 bil all except for pn with resisted IR on the Rt*  LYMPHEDEMA ASSESSMENTS:   LANDMARK RIGHT  Baseline 2022 EVAL  At axilla     15 cm proximal to olecranon process  32.0  10 cm proximal to olecranon process 30.6 31.5  Olecranon process 24.7 26.5  15 cm proximal to ulnar styloid process  24.5  10 cm proximal to ulnar styloid process 22 20.0  Just proximal to ulnar styloid process 14.9 16.4  Across hand at thumb web space 19.4 20.5  At base of 2nd digit 6.6 6.9  (Blank rows = not tested)  LANDMARK Baseline 2022 LEFT  eval  At axilla     15 cm proximal to olecranon process  32.5  10 cm proximal to olecranon process 31.2 32  Olecranon process 25.7 28  15  cm  proximal to ulnar styloid process  23.8  10 cm proximal to ulnar styloid process 21.6 20.2  Just proximal to ulnar styloid process 15.5 17.0  Across hand at thumb web space 19.7 20.5  At base of 2nd digit 6.6 7.0  (Blank rows = not tested)  QUICK DASH SURVEY: 45% limited   TODAY'S TREATMENT:  DATE: 02/17/23  Eval performed Education and handouts on retractions and UT stretch  PATIENT EDUCATION:  Education details: POC, initial exercises  Person educated: Patient Education method: Explanation, Demonstration, and Handouts Education comprehension: verbalized understanding  HOME EXERCISE PROGRAM: Access Code: [email or web address] URL: [email or web address] Date: 02/17/2023 Prepared by: Gwenevere Abbot  Exercises - Seated Scapular Retraction  - 1-3 x daily - 7 x weekly - 1 sets - 10 reps - 3-4 second hold - Seated Cervical Sidebending Stretch  - 1-3 x daily - 7 x weekly - 1 sets - 3 reps - 20-30 seconds hold  ASSESSMENT:  CLINICAL IMPRESSION: Patient is a 82 y.o. female who was seen today for physical therapy evaluation and treatment for her new onset of Rt shoulder pain.  She had a lumpectomy in 2022 but no radiation.  She has tenderness to the biceps tendon insertion and Rt UT and pain reproduced with resisted IR at the medial shoulder.  She also has pain with Active and passive abduction with Subacromial pain syndrome/impingement type presentation.  She will benefit from PT to decrease shoulder pain and would like to attend 1x per week. Circumferential measurements are equal bilaterally.   OBJECTIVE IMPAIRMENTS: decreased knowledge of condition, decreased ROM, decreased strength, and pain.   ACTIVITY LIMITATIONS: none  PARTICIPATION LIMITATIONS: cleaning, community activity, and yard work  PERSONAL FACTORS: Age, Fitness, and 1 comorbidity:  lumpectomy with SLNB  are also affecting patient's functional outcome.   REHAB POTENTIAL: Good  CLINICAL DECISION MAKING: Stable/uncomplicated  EVALUATION COMPLEXITY: Low  GOALS: Goals reviewed with patient? Yes  SHORT TERM GOALS = LTGs: Target date: 04/14/23  Pt will improve Rt shoulder pain to 0/10 at rest Baseline: 3/10 Goal status: INITIAL  2.  Pt will improve Rt shoulder abduction to at least 140 to equal the opposite side  Baseline: 130 with pain Goal status: INITIAL  3.  Pt will be ind with final HEP Baseline:  Goal status: INITIAL   PLAN:  PT FREQUENCY: 1-2x/week  PT DURATION: 6 weeks  PLANNED INTERVENTIONS: Therapeutic exercises, Therapeutic activity, Neuromuscular re-education, Balance training, Gait training, Patient/Family education, Self Care, Joint mobilization, Dry Needling, Taping, Ionotophoresis 4mg /ml Dexamethasone, and Re-evaluation, Manual Therapy  PLAN FOR NEXT SESSION: STM Rt UT and shoulder, cross friction biceps, postural TE to decrease impingement, ionto and DN as needed.   Idamae Lusher, PT 02/17/2023, 12:09 PM

## 2023-02-24 ENCOUNTER — Ambulatory Visit: Payer: Medicare Other

## 2023-02-28 DIAGNOSIS — H1045 Other chronic allergic conjunctivitis: Secondary | ICD-10-CM | POA: Diagnosis not present

## 2023-03-01 ENCOUNTER — Inpatient Hospital Stay: Payer: Medicare Other | Attending: Adult Health | Admitting: Hematology and Oncology

## 2023-03-01 ENCOUNTER — Other Ambulatory Visit: Payer: Self-pay

## 2023-03-01 VITALS — BP 118/61 | HR 73 | Temp 97.7°F | Resp 17 | Wt 190.9 lb

## 2023-03-01 DIAGNOSIS — Z888 Allergy status to other drugs, medicaments and biological substances status: Secondary | ICD-10-CM | POA: Insufficient documentation

## 2023-03-01 DIAGNOSIS — Z79811 Long term (current) use of aromatase inhibitors: Secondary | ICD-10-CM | POA: Insufficient documentation

## 2023-03-01 DIAGNOSIS — Z170 Estrogen receptor positive status [ER+]: Secondary | ICD-10-CM | POA: Diagnosis not present

## 2023-03-01 DIAGNOSIS — M2550 Pain in unspecified joint: Secondary | ICD-10-CM | POA: Insufficient documentation

## 2023-03-01 DIAGNOSIS — C50511 Malignant neoplasm of lower-outer quadrant of right female breast: Secondary | ICD-10-CM | POA: Insufficient documentation

## 2023-03-01 DIAGNOSIS — Z880 Allergy status to penicillin: Secondary | ICD-10-CM | POA: Insufficient documentation

## 2023-03-01 DIAGNOSIS — Z881 Allergy status to other antibiotic agents status: Secondary | ICD-10-CM | POA: Diagnosis not present

## 2023-03-01 DIAGNOSIS — Z79899 Other long term (current) drug therapy: Secondary | ICD-10-CM | POA: Diagnosis not present

## 2023-03-01 NOTE — Assessment & Plan Note (Addendum)
07/14/2020:Screening mammogram showed a 1.5cm right breast mass. US showed the 1.4cm mass at the 7 o'clock position, a 2.7cm mass at the 9 o'clock position, and benign right axillary lymph nodes. Biopsy showed ALH at the 9 o'clock position, and at the 7 o'clock position, IDC, grade 3, HER-2 positive (3+), ER+ 20% weak, PR- 0%, Ki67 40%.   Treatment Plan: 1. Neoadjuvant chemotherapy with Taxol Herceptin followed by Herceptin maintenance for 1 year completed 03/25/2021.  Stopped for low EF 2. 05/12/2021: Pathologic complete response  Right lumpectomy: LCIS involving CSL, intraductal papilloma negative for IDC or DCIS Right lateral margin lumpectomy: LCIS, ADH Lymph nodes: 3 negative 3. Followed by adjuvant radiation therapy if patient had lumpectomy 4.  Followed by adjuvant antiestrogen therapy ------------------------------------------------------------------------------------------------------------------------------- Current treatment:  Anastrozole 1 mg daily Anastrozole toxicities: Muscle aches and pains especially in her right shoulder.  I discussed with her about stopping anastrozole for 3 weeks and then a telephone call after that to discuss switching treatment to either letrozole or exemestane or tamoxifen.  She will read more about these medications and will make a decision on that.   Breast cancer surveillance: 1.  Breast exam 03/01/2023: Benign 2. mammogram 10/07/2022: Benign breast density category C   Bone density 02/09/2022: T-score -0.6: Normal Coronary calcium score: 150   She is very concerned about her husband's health as well.  He has gotten weaker.   Telephone visit in 3 weeks to discuss the treatment plan.

## 2023-03-01 NOTE — Progress Notes (Signed)
Patient Care Team: [name], MD (Inactive) as PCP - General (Family Medicine) Little Ishikawa, MD as PCP - Cardiology (Cardiology) Emelia Loron, MD as Consulting Physician (General Surgery) Serena Croissant, MD as Consulting Physician (Hematology and Oncology) Dorothy Puffer, MD as Consulting Physician (Radiation Oncology) Little Ishikawa, MD as Consulting Physician (Cardiology)  DIAGNOSIS:  Encounter Diagnosis  Name Primary?   Malignant neoplasm of lower-outer quadrant of right breast of female, estrogen receptor positive (HCC) Yes    SUMMARY OF ONCOLOGIC HISTORY: Oncology History  Malignant neoplasm of lower-outer quadrant of right breast of female, estrogen receptor positive (HCC)  07/14/2020 Initial Diagnosis   Screening mammogram showed a 1.5cm right breast mass. US showed the 1.4cm mass at the 7 o'clock position, a 2.7cm mass at the 9 o'clock position, and benign right axillary lymph nodes. Biopsy showed ALH at the 9 o'clock position, and at the 7 o'clock position, IDC, grade 3, HER-2 positive (3+), ER+ 20% weak, PR- 0%, Ki67 40%.   09/03/2020 - 11/19/2020 Neo-Adjuvant Chemotherapy   Weekly Taxol (dose reduced) and Herceptin x 12   12/10/2020 - 03/25/2021 Chemotherapy   Patient is on Treatment Plan : BREAST Trastuzumab q21d      05/12/2021 Surgery   Right lumpectomy: LCIS involving CSL, intraductal papilloma negative for IDC or DCIS Right lateral margin lumpectomy: LCIS, ADH Lymph nodes: 3 negative    08/2021 -  Anti-estrogen oral therapy   Anastrozole daily     CHIEF COMPLIANT: Anastrozole follow-up  INTERVAL HISTORY: HEARTLY HAUPTMAN is a 82 year old with above-mentioned history of right breast cancer who was currently on anastrozole. Patient reports that her health has been good. She does have a lot of joint pain and muscle tight pain.  She had surgery on her right foot and she is still healing and recovering from that.  ALLERGIES:  is allergic  to iodine, shellfish allergy, atorvastatin, cephalosporins, and penicillins.  MEDICATIONS:  Current Outpatient Medications  Medication Sig Dispense Refill   anastrozole (ARIMIDEX) 1 MG tablet Take 1 tablet by mouth once daily 90 tablet 0   Apoaequorin (PREVAGEN) 10 MG CAPS Take 10 mg by mouth daily.     aspirin EC 81 MG tablet Take 1 tablet (81 mg total) by mouth daily. Swallow whole. 90 tablet 3   b complex vitamins capsule Take 1 capsule by mouth daily.     Bioflavonoid Products (SUPER C-500 COMPLEX PO) Take 1 tablet by mouth daily.     cholecalciferol (VITAMIN D3) 25 MCG (1000 UNIT) tablet Take 1,000 Units by mouth daily.     dapagliflozin propanediol (FARXIGA) 10 MG TABS tablet Take 1 tablet (10 mg total) by mouth daily before breakfast. 90 tablet 3   Evening Primrose Oil 500 MG CAPS Take 500 mg by mouth daily.     Garlic 1000 MG CAPS Take 1,000 mg by mouth daily.     losartan (COZAAR) 25 MG tablet Take 1 tablet (25 mg total) by mouth daily. 90 tablet 1   metoprolol succinate (TOPROL-XL) 25 MG 24 hr tablet Take 1 tablet by mouth once daily 90 tablet 1   Multiple Vitamin (MULTIVITAMIN) tablet Take 1 tablet by mouth daily.     rosuvastatin (CRESTOR) 10 MG tablet Take 1 tablet (10 mg total) by mouth every other day. 45 tablet 3   spironolactone (ALDACTONE) 25 MG tablet Take 1/2 (one-half) tablet by mouth once daily 45 tablet 1   Vitamin A 7.5 MG (25000 UT) CAPS Take 25,000 Units by mouth daily.  No current facility-administered medications for this visit.    PHYSICAL EXAMINATION: ECOG PERFORMANCE STATUS: 1 - Symptomatic but completely ambulatory  Vitals:   03/01/23 1413  BP: 118/61  Pulse: 73  Resp: 17  Temp: 97.7 F (36.5 C)  SpO2: 100%   Filed Weights   03/01/23 1413  Weight: 190 lb 14.4 oz (86.6 kg)      LABORATORY DATA:  I have reviewed the data as listed    Latest Ref Rng & Units 05/11/2022    3:44 PM 12/30/2021    4:53 PM 09/11/2021    3:35 PM  CMP  Glucose  70 - 99 mg/dL 90  93  95   BUN 8 - 27 mg/dL 16  20  15    Creatinine 0.57 - 1.00 mg/dL 2.84  1.32  4.40   Sodium 134 - 144 mmol/L 142  143  141   Potassium 3.5 - 5.2 mmol/L 4.5  4.8  4.6   Chloride 96 - 106 mmol/L 103  105  103   CO2 20 - 29 mmol/L 22  24  23    Calcium 8.7 - 10.3 mg/dL 10.2  72.5  9.9     Lab Results  Component Value Date   WBC 5.9 05/27/2021   HGB 12.1 05/27/2021   HCT 37.3 05/27/2021   MCV 81.1 05/27/2021   PLT 208 05/27/2021   NEUTROABS 3.2 05/27/2021    ASSESSMENT & PLAN:  Malignant neoplasm of lower-outer quadrant of right breast of female, estrogen receptor positive (HCC) 07/14/2020:Screening mammogram showed a 1.5cm right breast mass. US showed the 1.4cm mass at the 7 o'clock position, a 2.7cm mass at the 9 o'clock position, and benign right axillary lymph nodes. Biopsy showed ALH at the 9 o'clock position, and at the 7 o'clock position, IDC, grade 3, HER-2 positive (3+), ER+ 20% weak, PR- 0%, Ki67 40%.   Treatment Plan: 1. Neoadjuvant chemotherapy with Taxol Herceptin followed by Herceptin maintenance for 1 year completed 03/25/2021.  Stopped for low EF 2. 05/12/2021: Pathologic complete response  Right lumpectomy: LCIS involving CSL, intraductal papilloma negative for IDC or DCIS Right lateral margin lumpectomy: LCIS, ADH Lymph nodes: 3 negative 3. Followed by adjuvant radiation therapy if patient had lumpectomy 4.  Followed by adjuvant antiestrogen therapy ------------------------------------------------------------------------------------------------------------------------------- Current treatment:  Anastrozole 1 mg daily Anastrozole toxicities: Muscle aches and pains especially in her right shoulder.  I discussed with her about stopping anastrozole for 3 weeks and then a telephone call after that to discuss switching treatment to either letrozole or exemestane or tamoxifen.  She will read more about these medications and will make a decision on that.    Breast cancer surveillance: 1.  Breast exam 03/01/2023: Benign 2. mammogram 10/07/2022: Benign breast density category C   Bone density 02/09/2022: T-score -0.6: Normal Coronary calcium score: 150   She is very concerned about her husband's health as well.  He has gotten weaker.   Telephone visit in 3 weeks to discuss the treatment plan.   Orders Placed This Encounter  Procedures   MM DIAG BREAST TOMO BILATERAL    Standing Status:   Future    Standing Expiration Date:   02/29/2024    Order Specific Question:   Reason for Exam (SYMPTOM  OR DIAGNOSIS REQUIRED)    Answer:   surveillance annual mammogram    Order Specific Question:   Preferred imaging location?    Answer:   [facility]   The patient has a good understanding of the overall  plan. she agrees with it. she will call with any problems that may develop before the next visit here. Total time spent: 30 mins including face to face time and time spent for planning, charting and co-ordination of care   Tamsen Meek, MD 03/01/23    I Janan Ridge am acting as a Neurosurgeon for [employer]  I have reviewed the above documentation for accuracy and completeness, and I agree with the above.

## 2023-03-02 ENCOUNTER — Telehealth: Payer: Self-pay | Admitting: Hematology and Oncology

## 2023-03-02 NOTE — Telephone Encounter (Signed)
Scheduled appointments per 8/13 los. Left voicemail with appointment details.

## 2023-03-03 ENCOUNTER — Ambulatory Visit: Payer: Medicare Other

## 2023-03-03 DIAGNOSIS — Z483 Aftercare following surgery for neoplasm: Secondary | ICD-10-CM | POA: Diagnosis not present

## 2023-03-03 DIAGNOSIS — M25611 Stiffness of right shoulder, not elsewhere classified: Secondary | ICD-10-CM | POA: Diagnosis not present

## 2023-03-03 DIAGNOSIS — R293 Abnormal posture: Secondary | ICD-10-CM

## 2023-03-03 DIAGNOSIS — Z170 Estrogen receptor positive status [ER+]: Secondary | ICD-10-CM

## 2023-03-03 DIAGNOSIS — C50511 Malignant neoplasm of lower-outer quadrant of right female breast: Secondary | ICD-10-CM | POA: Diagnosis not present

## 2023-03-03 NOTE — Therapy (Signed)
OUTPATIENT PHYSICAL THERAPY  UPPER EXTREMITY ONCOLOGY TREATMENT  Patient Name: Claudia Ortiz MRN: [id number] DOB:11/23/1940, 82 y.o., female Today's Date: 03/03/2023  END OF SESSION:  PT End of Session - 03/03/23 1210     Visit Number 2    Number of Visits 7    Date for PT Re-Evaluation 04/14/23    Authorization Type No auth needed    PT Start Time 1206    PT Stop Time 1255    PT Time Calculation (min) 49 min    Activity Tolerance Patient tolerated treatment well    Behavior During Therapy [facility] for tasks assessed/performed             Past Medical History:  Diagnosis Date   Anxiety    Arthritis 2019   Asthma    As Child   Cancer (HCC) 05/2020   R breast   Colon polyp    Elevated cholesterol    Intraductal papilloma of left breast     precancerous cells in R breast   Personal history of chemotherapy    Tricuspid valve mass    on 11/21/20 echo   Past Surgical History:  Procedure Laterality Date   AXILLARY SENTINEL NODE BIOPSY Right 05/12/2021   Procedure: AXILLARY SENTINEL NODE BIOPSY WITH MAGTRACE;  Surgeon: Emelia Loron, MD;  Location: [facility] OR;  Service: General;  Laterality: Right;   BREAST LUMPECTOMY Right 05/12/2021   BREAST LUMPECTOMY WITH RADIOACTIVE SEED AND SENTINEL LYMPH NODE BIOPSY Right 05/12/2021   Procedure: RIGHT BREAST LUMPECTOMY WITH RADIOACTIVE SEED X3;  Surgeon: Emelia Loron, MD;  Location: [facility] OR;  Service: General;  Laterality: Right;   CARPOMETACARPEL SUSPENSION PLASTY Right 03/11/2022   Procedure: RIGHT THUMB TRAPEZIECTOMY AND SUSPENSIONPLASTY;  Surgeon: Betha Loa, MD;  Location: [facility];  Service: Orthopedics;  Laterality: Right;   colon polyp     removed   CYST REMOVAL TRUNK     breast    EYE SURGERY Left    cataract removal   FOOT SURGERY     PORTACATH PLACEMENT Left 09/02/2020   Procedure: INSERTION PORT-A-CATH;  Surgeon: Emelia Loron, MD;  Location: [facility];  Service: General;   Laterality: Left;   TEE WITHOUT CARDIOVERSION N/A 01/01/2021   Procedure: TRANSESOPHAGEAL ECHOCARDIOGRAM (TEE);  Surgeon: Parke Poisson, MD;  Location: [facility] ENDOSCOPY;  Service: Cardiology;  Laterality: N/A;   TENDON TRANSFER Right 03/11/2022   Procedure: TENDON TRANSFER;  Surgeon: Betha Loa, MD;  Location: [facility];  Service: Orthopedics;  Laterality: Right;   TONSILLECTOMY     as a child   Patient Active Problem List   Diagnosis Date Noted   HFrEF (heart failure with reduced ejection fraction) (HCC) 07/03/2021   Tricuspid valve mass    Port-A-Cath in place 09/10/2020   Malignant neoplasm of lower-outer quadrant of right breast of female, estrogen receptor positive (HCC) 07/14/2020   Dyspnea 02/18/2014   Fatigue 02/18/2014   Abnormal ECG 02/18/2014   History of left breast cancer 02/18/2014   Hyperlipidemia 02/18/2014    PCP: [name], MD  REFERRING PROVIDER: Dr. Emelia Loron   REFERRING DIAG: Rt shoulder pain  THERAPY DIAG:  Aftercare following surgery for neoplasm  Malignant neoplasm of lower-outer quadrant of right breast of female, estrogen receptor positive (HCC)  Stiffness of right shoulder, not elsewhere classified  Abnormal posture  ONSET DATE: 01/17/23  Rationale for Evaluation and Treatment: Rehabilitation  SUBJECTIVE:  SUBJECTIVE STATEMENT: I feel so much better since I was here. I've been doing the exercises Diannia Ruder gave me and I don't think I need to come any more.   PERTINENT HISTORY: Completed chemo, Rt lumpectomy with SLNB in 2022.  No radiation. Hx of Lt breast cancer in 2015.    PAIN:  Are you having pain? Yes NPRS scale: 0-1/10 Pain location: anterior shoulder  Pain orientation: Right  PAIN TYPE: just very mild pain Pain description:  intermittent Aggravating factors: not much now Relieving factors: HEP stretches   PRECAUTIONS: lymphedema risk  RED FLAGS: None   WEIGHT BEARING RESTRICTIONS: No  FALLS:  Has patient fallen in last 6 months? No  LIVING ENVIRONMENT: Lives with: lives with their spouse Lives in: House/apartment  OCCUPATION: Retired  LEISURE: not really    HAND DOMINANCE: right   PRIOR LEVEL OF FUNCTION: Independent  PATIENT GOALS: Decrease shoulder pain    OBJECTIVE:  COGNITION: Overall cognitive status: Within functional limits for tasks assessed   PALPATION: +1-2 ttp biceps tendon  OBSERVATIONS / OTHER ASSESSMENTS: rounded shoulders, forward head posture,   POSTURE: per above  UPPER EXTREMITY AROM/PROM:  A/PROM 2022 - baseline RIGHT   eval  RIGHT 03/03/23  Shoulder extension 50 50   Shoulder flexion 151 141  166  Shoulder abduction 168 130 - pain 159 - twinge  Shoulder internal rotation     Shoulder external rotation 80 90     (Blank rows = not tested)  A/PROM LEFT   eval  Shoulder extension 50  Shoulder flexion 141  Shoulder abduction 145  Shoulder internal rotation   Shoulder external rotation 90    (Blank rows = not tested)  CERVICAL AROM: All within normal limits: Just tight   UPPER EXTREMITY STRENGTH: 4+/5 bil all except for pn with resisted IR on the Rt*  LYMPHEDEMA ASSESSMENTS:   LANDMARK RIGHT  Baseline 2022 EVAL  At axilla     15 cm proximal to olecranon process  32.0  10 cm proximal to olecranon process 30.6 31.5  Olecranon process 24.7 26.5  15 cm proximal to ulnar styloid process  24.5  10 cm proximal to ulnar styloid process 22 20.0  Just proximal to ulnar styloid process 14.9 16.4  Across hand at thumb web space 19.4 20.5  At base of 2nd digit 6.6 6.9  (Blank rows = not tested)  LANDMARK Baseline 2022 LEFT  eval  At axilla     15 cm proximal to olecranon process  32.5  10 cm proximal to olecranon process 31.2 32  Olecranon process  25.7 28  15  cm proximal to ulnar styloid process  23.8  10 cm proximal to ulnar styloid process 21.6 20.2  Just proximal to ulnar styloid process 15.5 17.0  Across hand at thumb web space 19.7 20.5  At base of 2nd digit 6.6 7.0  (Blank rows = not tested)  QUICK DASH SURVEY: 45% limited   TODAY'S TREATMENT:  DATE:  03/03/23: Self Care Spent time discussing pts current function and remeasured A/ROM. Also briefly reviewed lymphedema risk reduction from ABC class and how to safely progress resistance with exercises with "low and slow".  Therapeutic Exercises Progressed HEP to  include the following: Rockwood with Red theraband for Rt UE having pt return therapist demo for each Standing with back against wall/core engaged and head/shoulders against wall for bil UE 3 way raises x 10 each. Educated pt to not do both rockwood and 3 way raises in same day for now until her lymph nodes have had time to adjust to new exercises and give her time to assess her limb after performing them. She was able to verbalize good understanding.   02/17/23  Eval performed Education and handouts on retractions and UT stretch Rt UE Rockwood and bil UE 3 way raises  PATIENT EDUCATION:  Education details: Rt UE Rockwood and 3 way raises  Person educated: Patient Education method: Explanation, Demonstration, and Handouts Education comprehension: verbalized understanding  HOME EXERCISE PROGRAM: Access Code: [email or web address] URL: [email or web address] Date: 02/17/2023 Prepared by: Gwenevere Abbot  Exercises - Seated Scapular Retraction  - 1-3 x daily - 7 x weekly - 1 sets - 10 reps - 3-4 second hold - Seated Cervical Sidebending Stretch  - 1-3 x daily - 7 x weekly - 1 sets - 3 reps - 20-30 seconds hold  ASSESSMENT:  CLINICAL IMPRESSION:  Pt reports doing much better since  here for evaluation. Her A/ROM has returned and she has no pain with motions now. Progressed her HEP, see above, to now incorporate strength for Rt UE. Pt had no pain with these exercises and was instructed how to progress them "low and slow". Pt wants to be on hold x 2 weeks and will call us to let us know if ready for D/C at that time or need to return.   OBJECTIVE IMPAIRMENTS: decreased knowledge of condition, decreased ROM, decreased strength, and pain.   ACTIVITY LIMITATIONS: none  PARTICIPATION LIMITATIONS: cleaning, community activity, and yard work  PERSONAL FACTORS: Age, Fitness, and 1 comorbidity: lumpectomy with SLNB  are also affecting patient's functional outcome.   REHAB POTENTIAL: Good  CLINICAL DECISION MAKING: Stable/uncomplicated  EVALUATION COMPLEXITY: Low  GOALS: Goals reviewed with patient? Yes  SHORT TERM GOALS = LTGs: Target date: 04/14/23  Pt will improve Rt shoulder pain to 0/10 at rest Baseline: 3/10 Goal status: INITIAL  2.  Pt will improve Rt shoulder abduction to at least 140 to equal the opposite side  Baseline: 130 with pain Goal status: INITIAL  3.  Pt will be ind with final HEP Baseline:  Goal status: INITIAL   PLAN:  PT FREQUENCY: 1-2x/week  PT DURATION: 6 weeks  PLANNED INTERVENTIONS: Therapeutic exercises, Therapeutic activity, Neuromuscular re-education, Balance training, Gait training, Patient/Family education, Self Care, Joint mobilization, Dry Needling, Taping, Ionotophoresis 4mg /ml Dexamethasone, and Re-evaluation, Manual Therapy  PLAN FOR NEXT SESSION: Pt on hold x 2 weeks and may be ready for D/C at that time. STM Rt UT and shoulder, cross friction biceps, postural TE to decrease impingement, ionto and DN as needed.   Hermenia Bers, PTA 03/03/2023, 1:17 PM   Strengthening: Resisted Flexion   Cancer Rehab [phone]    Hold tubing with right arm at side. Pull forward and up. Move shoulder through pain-free range of  motion. Repeat __5-10__ times per set. Do _1-2___ sessions per day.  Strengthening: Resisted Internal Rotation    Hold tubing in right hand, elbow  at side and forearm out. Rotate forearm in across body. Repeat __5-10__ times per set. Do _1-2___ sessions per day.  Strengthening: Resisted Extension    Hold tubing in right hand, arm forward. Pull arm back, elbow straight. Repeat _5-10___ times per set. Do _1-2___ sessions per day.  Strengthening: Resisted External Rotation    Hold tubing in right hand, elbow at side and forearm across body. Rotate forearm out. Repeat _5-10___ times per set. Do __1-2__ sessions per day.  3 Way Raises:      Starting Position:  Leaning against wall, walk feet a few inches away from the wall and make tummy tight (tuck hips underneath you) Press back/shoulders/head against wall as much as possible. Keep thumbs up to ceiling, elbows straight and shoulders relaxed/down throughout.  1. Lift arms in front to shoulder height 2. Lift arms a little wider into a "V" to shoulder height 3. Lift arms out to sides in a "T" to shoulder height  Perform 10 times in each direction. Hold 1-2 lbs to start with and work up to 2-3 sets of 10/day. Perform 3-4 times/week. Increase weight as able, decreasing sets of 10 each time you increase weights, then slowly working your way back up to 2-3 sets each time.

## 2023-03-10 ENCOUNTER — Encounter: Payer: Medicare Other | Admitting: Rehabilitation

## 2023-03-17 ENCOUNTER — Encounter: Payer: Medicare Other | Admitting: Rehabilitation

## 2023-03-23 ENCOUNTER — Other Ambulatory Visit: Payer: Self-pay | Admitting: Cardiology

## 2023-03-24 ENCOUNTER — Encounter: Payer: Medicare Other | Admitting: Rehabilitation

## 2023-03-25 ENCOUNTER — Inpatient Hospital Stay: Payer: Medicare Other | Attending: Adult Health | Admitting: Hematology and Oncology

## 2023-03-25 DIAGNOSIS — Z170 Estrogen receptor positive status [ER+]: Secondary | ICD-10-CM

## 2023-03-25 DIAGNOSIS — C50511 Malignant neoplasm of lower-outer quadrant of right female breast: Secondary | ICD-10-CM | POA: Diagnosis not present

## 2023-03-25 NOTE — Progress Notes (Signed)
HEMATOLOGY-ONCOLOGY TELEPHONE VISIT PROGRESS NOTE  I connected with our patient on 03/25/23 at  9:30 AM EDT by telephone and verified that I am speaking with the correct person using two identifiers.  I discussed the limitations, risks, security and privacy concerns of performing an evaluation and management service by telephone and the availability of in person appointments.  I also discussed with the patient that there may be a patient responsible charge related to this service. The patient expressed understanding and agreed to proceed.   History of Present Illness: Follow-up by telephone to discuss whether her symptoms improved after stopping anastrozole.  She reports that her symptoms did not get any better after stopping anastrozole.  Oncology History  Malignant neoplasm of lower-outer quadrant of right breast of female, estrogen receptor positive (HCC)  07/14/2020 Initial Diagnosis   Screening mammogram showed a 1.5cm right breast mass. US showed the 1.4cm mass at the 7 o'clock position, a 2.7cm mass at the 9 o'clock position, and benign right axillary lymph nodes. Biopsy showed ALH at the 9 o'clock position, and at the 7 o'clock position, IDC, grade 3, HER-2 positive (3+), ER+ 20% weak, PR- 0%, Ki67 40%.   09/03/2020 - 11/19/2020 Neo-Adjuvant Chemotherapy   Weekly Taxol (dose reduced) and Herceptin x 12   12/10/2020 - 03/25/2021 Chemotherapy   Patient is on Treatment Plan : BREAST Trastuzumab q21d      05/12/2021 Surgery   Right lumpectomy: LCIS involving CSL, intraductal papilloma negative for IDC or DCIS Right lateral margin lumpectomy: LCIS, ADH Lymph nodes: 3 negative    08/2021 -  Anti-estrogen oral therapy   Anastrozole daily     REVIEW OF SYSTEMS:   Constitutional: Denies fevers, chills or abnormal weight loss All other systems were reviewed with the patient and are negative. Observations/Objective:     Assessment Plan:  Malignant neoplasm of lower-outer quadrant of right  breast of female, estrogen receptor positive (HCC) 07/14/2020:Screening mammogram showed a 1.5cm right breast mass. US showed the 1.4cm mass at the 7 o'clock position, a 2.7cm mass at the 9 o'clock position, and benign right axillary lymph nodes. Biopsy showed ALH at the 9 o'clock position, and at the 7 o'clock position, IDC, grade 3, HER-2 positive (3+), ER+ 20% weak, PR- 0%, Ki67 40%.   Treatment Plan: 1. Neoadjuvant chemotherapy with Taxol Herceptin followed by Herceptin maintenance for 1 year completed 03/25/2021.  Stopped for low EF 2. 05/12/2021: Pathologic complete response  Right lumpectomy: LCIS involving CSL, intraductal papilloma negative for IDC or DCIS Right lateral margin lumpectomy: LCIS, ADH Lymph nodes: 3 negative 3. Followed by adjuvant radiation therapy  4.  Followed by adjuvant antiestrogen therapy with anastrozole started 08/27/2021 discontinued 03/01/2023 (muscle aches and pains especially right shoulder) -------------------------------------------------------------------------------------------------------------- Current treatment: Since the pain did not get better, I recommended that she should remain on anastrozole therapy. She probably has osteoarthritis in the shoulder. Return to clinic next year for follow-up.   I discussed the assessment and treatment plan with the patient. The patient was provided an opportunity to ask questions and all were answered. The patient agreed with the plan and demonstrated an understanding of the instructions. The patient was advised to call back or seek an in-person evaluation if the symptoms worsen or if the condition fails to improve as anticipated.   I provided 12 minutes of non-face-to-face time during this encounter.  This includes time for charting and coordination of care   Tamsen Meek, MD

## 2023-03-25 NOTE — Assessment & Plan Note (Signed)
07/14/2020:Screening mammogram showed a 1.5cm right breast mass. US showed the 1.4cm mass at the 7 o'clock position, a 2.7cm mass at the 9 o'clock position, and benign right axillary lymph nodes. Biopsy showed ALH at the 9 o'clock position, and at the 7 o'clock position, IDC, grade 3, HER-2 positive (3+), ER+ 20% weak, PR- 0%, Ki67 40%.   Treatment Plan: 1. Neoadjuvant chemotherapy with Taxol Herceptin followed by Herceptin maintenance for 1 year completed 03/25/2021.  Stopped for low EF 2. 05/12/2021: Pathologic complete response  Right lumpectomy: LCIS involving CSL, intraductal papilloma negative for IDC or DCIS Right lateral margin lumpectomy: LCIS, ADH Lymph nodes: 3 negative 3. Followed by adjuvant radiation therapy if patient had lumpectomy 4.  Followed by adjuvant antiestrogen therapy with anastrozole started 08/27/2021 discontinued 03/01/2023 (muscle aches and pains especially right shoulder) -------------------------------------------------------------------------------------------------------------- Current treatment: I recommend switching over to exemestane or letrozole.  Return to clinic for a telephone visit in 3 months

## 2023-03-31 ENCOUNTER — Encounter: Payer: Medicare Other | Admitting: Rehabilitation

## 2023-04-02 DIAGNOSIS — Z23 Encounter for immunization: Secondary | ICD-10-CM | POA: Diagnosis not present

## 2023-04-21 ENCOUNTER — Other Ambulatory Visit: Payer: Self-pay | Admitting: Cardiology

## 2023-05-06 DIAGNOSIS — I078 Other rheumatic tricuspid valve diseases: Secondary | ICD-10-CM | POA: Diagnosis not present

## 2023-05-06 DIAGNOSIS — I5042 Chronic combined systolic (congestive) and diastolic (congestive) heart failure: Secondary | ICD-10-CM | POA: Diagnosis not present

## 2023-05-06 DIAGNOSIS — I700 Atherosclerosis of aorta: Secondary | ICD-10-CM | POA: Diagnosis not present

## 2023-05-06 DIAGNOSIS — E7800 Pure hypercholesterolemia, unspecified: Secondary | ICD-10-CM | POA: Diagnosis not present

## 2023-05-06 DIAGNOSIS — C50911 Malignant neoplasm of unspecified site of right female breast: Secondary | ICD-10-CM | POA: Diagnosis not present

## 2023-05-06 DIAGNOSIS — C50919 Malignant neoplasm of unspecified site of unspecified female breast: Secondary | ICD-10-CM | POA: Diagnosis not present

## 2023-05-06 DIAGNOSIS — R7303 Prediabetes: Secondary | ICD-10-CM | POA: Diagnosis not present

## 2023-05-06 DIAGNOSIS — Z0000 Encounter for general adult medical examination without abnormal findings: Secondary | ICD-10-CM | POA: Diagnosis not present

## 2023-05-19 ENCOUNTER — Ambulatory Visit: Payer: Medicare Other | Attending: Cardiology | Admitting: Cardiology

## 2023-05-19 ENCOUNTER — Other Ambulatory Visit: Payer: Self-pay | Admitting: Cardiology

## 2023-05-19 ENCOUNTER — Encounter: Payer: Self-pay | Admitting: Cardiology

## 2023-05-19 VITALS — BP 106/84 | HR 71 | Ht 65.25 in | Wt 189.4 lb

## 2023-05-19 DIAGNOSIS — I078 Other rheumatic tricuspid valve diseases: Secondary | ICD-10-CM

## 2023-05-19 DIAGNOSIS — I5032 Chronic diastolic (congestive) heart failure: Secondary | ICD-10-CM | POA: Diagnosis not present

## 2023-05-19 DIAGNOSIS — E785 Hyperlipidemia, unspecified: Secondary | ICD-10-CM | POA: Diagnosis not present

## 2023-05-19 NOTE — Patient Instructions (Signed)
Medication Instructions:  No changes *If you need a refill on your cardiac medications before your next appointment, please call your pharmacy*   Lab Work: BMP, Mag- today If you have labs (blood work) drawn today and your tests are completely normal, you will receive your results only by: MyChart Message (if you have MyChart) OR A paper copy in the mail If you have any lab test that is abnormal or we need to change your treatment, we will call you to review the results.   Testing/Procedures: Your physician has requested that you have an echocardiogram in 6 months before follow up appointment with Dr Jerene Pitch. Echocardiography is a painless test that uses sound waves to create images of your heart. It provides your doctor with information about the size and shape of your heart and how well your heart's chambers and valves are working. This procedure takes approximately one hour. There are no restrictions for this procedure. Please do NOT wear cologne, perfume, aftershave, or lotions (deodorant is allowed). Please arrive 15 minutes prior to your appointment time.    Follow-Up: At [facility], you and your health needs are our priority.  As part of our continuing mission to provide you with exceptional heart care, we have created designated Provider Care Teams.  These Care Teams include your primary Cardiologist (physician) and Advanced Practice Providers (APPs -  Physician Assistants and Nurse Practitioners) who all work together to provide you with the care you need, when you need it.  We recommend signing up for the patient portal called "MyChart".  Sign up information is provided on this After Visit Summary.  MyChart is used to connect with patients for Virtual Visits (Telemedicine).  Patients are able to view lab/test results, encounter notes, upcoming appointments, etc.  Non-urgent messages can be sent to your provider as well.   To learn more about what you can do with MyChart,  go to [email or web address].    Your next appointment:   6 month(s)  Provider:   Little Ishikawa, MD

## 2023-05-20 ENCOUNTER — Other Ambulatory Visit: Payer: Self-pay | Admitting: Cardiology

## 2023-05-20 LAB — BASIC METABOLIC PANEL
BUN/Creatinine Ratio: 16 (ref 12–28)
BUN: 16 mg/dL (ref 8–27)
CO2: 21 mmol/L (ref 20–29)
Calcium: 10 mg/dL (ref 8.7–10.3)
Chloride: 106 mmol/L (ref 96–106)
Creatinine, Ser: 1.01 mg/dL — ABNORMAL HIGH (ref 0.57–1.00)
Glucose: 89 mg/dL (ref 70–99)
Potassium: 4.6 mmol/L (ref 3.5–5.2)
Sodium: 144 mmol/L (ref 134–144)
eGFR: 56 mL/min/{1.73_m2} — ABNORMAL LOW (ref >59)

## 2023-05-20 LAB — MAGNESIUM: Magnesium: 2.2 mg/dL (ref 1.6–2.3)

## 2023-05-23 ENCOUNTER — Other Ambulatory Visit: Payer: Self-pay | Admitting: Cardiology

## 2023-05-23 ENCOUNTER — Telehealth: Payer: Self-pay | Admitting: Cardiology

## 2023-05-23 MED ORDER — METOPROLOL SUCCINATE ER 25 MG PO TB24: 25.0000 mg | ORAL_TABLET | Freq: Every day | ORAL | 3 refills | Status: DC

## 2023-05-23 MED ORDER — LOSARTAN POTASSIUM 25 MG PO TABS: 25.0000 mg | ORAL_TABLET | Freq: Every day | ORAL | 3 refills | Status: DC

## 2023-05-23 NOTE — Telephone Encounter (Signed)
Pt's medications were sent to pt's pharmacy as requested. Confirmation received.  

## 2023-05-23 NOTE — Telephone Encounter (Signed)
*  STAT* If patient is at the pharmacy, call can be transferred to refill team.   1. Which medications need to be refilled? (please list name of each medication and dose if known)  losartan (COZAAR) 25 MG tablet  metoprolol succinate (TOPROL-XL) 25 MG 24 hr tablet  2. Which pharmacy/location (including street and city if local pharmacy) is medication to be sent to? [facility] [address] [place] - [address] Phone: [phone]  Fax: [phone]     3. Do they need a 30 day or 90 day supply?90 Pt has an appt with Dr Bjorn Pippin 05/19/23

## 2023-05-28 ENCOUNTER — Other Ambulatory Visit: Payer: Self-pay | Admitting: Hematology and Oncology

## 2023-05-28 DIAGNOSIS — Z170 Estrogen receptor positive status [ER+]: Secondary | ICD-10-CM

## 2023-06-20 ENCOUNTER — Other Ambulatory Visit: Payer: Self-pay | Admitting: Cardiology

## 2023-06-21 ENCOUNTER — Telehealth: Payer: Self-pay | Admitting: Cardiology

## 2023-06-21 ENCOUNTER — Other Ambulatory Visit: Payer: Self-pay

## 2023-06-21 MED ORDER — SPIRONOLACTONE 25 MG PO TABS: 25.0000 mg | ORAL_TABLET | Freq: Every day | ORAL | 3 refills | Status: DC

## 2023-06-21 NOTE — Telephone Encounter (Signed)
*  STAT* If patient is at the pharmacy, call can be transferred to refill team.   1. Which medications need to be refilled? (please list name of each medication and dose if known)   spironolactone (ALDACTONE) 25 MG tablet    2. Which pharmacy/location (including street and city if local pharmacy) is medication to be sent to?     [facility] [address] [place] - [address]    3. Do they need a 30 day or 90 day supply? 90

## 2023-07-19 ENCOUNTER — Other Ambulatory Visit: Payer: Self-pay | Admitting: Cardiology

## 2023-08-23 ENCOUNTER — Telehealth: Payer: Self-pay | Admitting: Cardiology

## 2023-08-23 DIAGNOSIS — E785 Hyperlipidemia, unspecified: Secondary | ICD-10-CM

## 2023-08-23 DIAGNOSIS — I5022 Chronic systolic (congestive) heart failure: Secondary | ICD-10-CM

## 2023-08-23 NOTE — Telephone Encounter (Signed)
 Called and spoke to patient. Verified name and DOB. Patient report that she is having dry mouth and dry eyes. She states she think it's coming from the Farxiga  after reading the side effects. She also stated she does not drink a lot of water. She asked if medication can be changed to take every other day. Encouraged patient to increase fluid intake. She verbalized understanding and agree. Please advised if patient can take Farxiga  to every other day.

## 2023-08-23 NOTE — Telephone Encounter (Signed)
 Pt c/o medication issue:  1. Name of Medication:  dapagliflozin  propanediol (FARXIGA ) 10 MG TABS tablet   2. How are you currently taking this medication (dosage and times per day)? As prescribed   3. Are you having a reaction (difficulty breathing--STAT)? Yes   4. What is your medication issue? Patient c/o feeling very dehydrated for the past 6 months. She reports she read this could be a side effect of this medication as well as kidney issues. She is wanting to know if she should decrease the medication to every other day due to this. Please advise.

## 2023-08-24 MED ORDER — DAPAGLIFLOZIN PROPANEDIOL 10 MG PO TABS: 10.0000 mg | ORAL_TABLET | ORAL | 3 refills | Status: AC

## 2023-08-24 NOTE — Telephone Encounter (Signed)
 Yes we can switch Farxiga  to every other day.  Would check BMET in 1 week

## 2023-08-24 NOTE — Telephone Encounter (Signed)
 Called and made patient aware that per Dr. Alda Amas take Farxiga  10 mg every other day and BMET in one week. Patient verbalized an understanding.

## 2023-08-27 ENCOUNTER — Other Ambulatory Visit: Payer: Self-pay | Admitting: Hematology and Oncology

## 2023-08-27 DIAGNOSIS — Z170 Estrogen receptor positive status [ER+]: Secondary | ICD-10-CM

## 2023-09-01 ENCOUNTER — Other Ambulatory Visit: Payer: Self-pay

## 2023-09-01 DIAGNOSIS — I5022 Chronic systolic (congestive) heart failure: Secondary | ICD-10-CM

## 2023-09-01 DIAGNOSIS — E785 Hyperlipidemia, unspecified: Secondary | ICD-10-CM

## 2023-09-02 ENCOUNTER — Encounter: Payer: Self-pay | Admitting: *Deleted

## 2023-09-02 LAB — BASIC METABOLIC PANEL
BUN/Creatinine Ratio: 15 (ref 12–28)
BUN: 16 mg/dL (ref 8–27)
CO2: 22 mmol/L (ref 20–29)
Calcium: 9.8 mg/dL (ref 8.7–10.3)
Chloride: 105 mmol/L (ref 96–106)
Creatinine, Ser: 1.04 mg/dL — ABNORMAL HIGH (ref 0.57–1.00)
Glucose: 95 mg/dL (ref 70–99)
Potassium: 4.3 mmol/L (ref 3.5–5.2)
Sodium: 142 mmol/L (ref 134–144)
eGFR: 53 mL/min/{1.73_m2} — ABNORMAL LOW (ref >59)

## 2023-10-06 ENCOUNTER — Other Ambulatory Visit: Payer: Self-pay

## 2023-10-06 MED ORDER — SPIRONOLACTONE 25 MG PO TABS: 12.5000 mg | ORAL_TABLET | Freq: Every day | ORAL | 2 refills | Status: DC

## 2023-10-07 ENCOUNTER — Ambulatory Visit
Admission: RE | Admit: 2023-10-07 | Discharge: 2023-10-07 | Disposition: A | Discharge status: Home / Self Care | Payer: Medicare Other | Source: Ambulatory Visit | Attending: Hematology and Oncology | Admitting: Hematology and Oncology

## 2023-10-07 DIAGNOSIS — Z08 Encounter for follow-up examination after completed treatment for malignant neoplasm: Secondary | ICD-10-CM | POA: Diagnosis not present

## 2023-10-07 DIAGNOSIS — Z170 Estrogen receptor positive status [ER+]: Secondary | ICD-10-CM

## 2023-10-07 DIAGNOSIS — Z853 Personal history of malignant neoplasm of breast: Secondary | ICD-10-CM | POA: Diagnosis not present

## 2023-10-18 NOTE — Progress Notes (Unsigned)
 Cardiology Office Note:    Date:  10/21/2023   ID:  DI JASMER, DOB 1941-05-21, MRN [id number]  PCP:  Ollen Bowl, MD   [facility] Providers Cardiologist:  Little Ishikawa, MD     Referring MD: Ollen Bowl, MD   CC: CHF  History of Present Illness:    Claudia Ortiz is a 83 y.o. female with a hx of anxiety, elevated cholesterol, colon polyp, intraductal papilloma of left breast, and tricuspid valve mass who presents for follow-up.  She was refered by Dr. Valentina Lucks for pre-op clearance for lumpectomy, initially seen on 12/23/2020.  Echocardiogram on 11/21/2020 showed EF 50 to 55%, normal RV function, echodensity on the tricuspid valve measuring 1 cm, mild TR. Transesophageal echocardiogram on 01/01/2021 showed 1.1 x 0.8 cm mass adherent to the atrial aspect of the posterior tricuspid valve leaflet, likely representing thrombus.  Also with small mobile echodensity on the pulmonary artery aspect of the pulmonary valve, likely representing thrombus.  EF 50%, normal RV function, small pericardial effusion, small PFO.  Lower extremity duplex on 01/02/2021 was negative.  Echocardiogram 04/07/2021 showed EF 35 to 40%, global hypokinesis, and grade 1 diastolic dysfunction, normal RV function, mild MR, unchanged tricuspid valve mass.  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  Calcium score 150 on 01/08/2022 (55th percentile).  Echocardiogram 04/21/2022 showed EF 50 to 55%, normal RV function, stable tricuspid valve mass, no significant valvular disease.  Echocardiogram 10/20/2023 showed EF 60 to 65%, normal RV function, stable tricuspid valve mass.  Since last clinic visit, she reports she is doing well.  She denies any chest pain, does report having some pain in right breast.  She denies any exertional chest pain or dyspnea.   Denies any lightheadedness, syncope, lower extremity edema, or palpitations. Has not been exercising regularly.   Past Medical History:  Diagnosis Date   Anxiety    Arthritis 2019   Asthma    As Child   Cancer (HCC) 05/2020   R breast   Colon polyp    Elevated cholesterol    Intraductal papilloma of left breast     precancerous cells in R breast   Personal history of chemotherapy    Tricuspid valve mass    on 11/21/20 echo    Past Surgical History:  Procedure Laterality Date   AXILLARY SENTINEL NODE BIOPSY Right 05/12/2021   Procedure: AXILLARY SENTINEL NODE BIOPSY WITH MAGTRACE;  Surgeon: Emelia Loron, MD;  Location: [facility] OR;  Service: General;  Laterality: Right;   BREAST LUMPECTOMY Right 05/12/2021   BREAST LUMPECTOMY WITH RADIOACTIVE SEED AND SENTINEL LYMPH NODE BIOPSY Right 05/12/2021   Procedure: RIGHT BREAST LUMPECTOMY WITH RADIOACTIVE SEED X3;  Surgeon: Emelia Loron, MD;  Location: [facility] OR;  Service: General;  Laterality: Right;   CARPOMETACARPEL SUSPENSION PLASTY Right 03/11/2022   Procedure: RIGHT THUMB TRAPEZIECTOMY AND SUSPENSIONPLASTY;  Surgeon: Betha Loa, MD;  Location: [facility];  Service: Orthopedics;  Laterality: Right;   colon polyp     removed   CYST REMOVAL TRUNK     breast    EYE SURGERY Left    cataract removal   FOOT SURGERY     PORTACATH PLACEMENT Left 09/02/2020   Procedure: INSERTION PORT-A-CATH;  Surgeon: Emelia Loron, MD;  Location: [facility];  Service: General;  Laterality: Left;   TEE  WITHOUT CARDIOVERSION N/A 01/01/2021   Procedure: TRANSESOPHAGEAL ECHOCARDIOGRAM (TEE);  Surgeon: Parke Poisson, MD;  Location: [facility] ENDOSCOPY;  Service: Cardiology;  Laterality: N/A;   TENDON TRANSFER Right 03/11/2022   Procedure: TENDON TRANSFER;  Surgeon: Betha Loa, MD;  Location: [facility];  Service: Orthopedics;  Laterality: Right;   TONSILLECTOMY     as a child    Current  Medications: Current Meds  Medication Sig   anastrozole (ARIMIDEX) 1 MG tablet Take 1 tablet by mouth once daily   Apoaequorin (PREVAGEN) 10 MG CAPS Take 10 mg by mouth daily.   aspirin EC 81 MG tablet Take 1 tablet (81 mg total) by mouth daily. Swallow whole.   b complex vitamins capsule Take 1 capsule by mouth daily.   Bioflavonoid Products (SUPER C-500 COMPLEX PO) Take 1 tablet by mouth daily.   cholecalciferol (VITAMIN D3) 25 MCG (1000 UNIT) tablet Take 1,000 Units by mouth daily.   dapagliflozin propanediol (FARXIGA) 10 MG TABS tablet Take 1 tablet (10 mg total) by mouth every other day.   Evening Primrose Oil 500 MG CAPS Take 500 mg by mouth daily.   Garlic 1000 MG CAPS Take 1,000 mg by mouth daily.   losartan (COZAAR) 25 MG tablet Take 1 tablet (25 mg total) by mouth daily.   metoprolol succinate (TOPROL-XL) 25 MG 24 hr tablet Take 1 tablet (25 mg total) by mouth daily.   Multiple Vitamin (MULTIVITAMIN) tablet Take 1 tablet by mouth daily.   rosuvastatin (CRESTOR) 10 MG tablet TAKE 1 TABLET BY MOUTH EVERY OTHER DAY   spironolactone (ALDACTONE) 25 MG tablet Take 0.5 tablets (12.5 mg total) by mouth daily.   Vitamin A 7.5 MG (25000 UT) CAPS Take 25,000 Units by mouth daily.     Allergies:   Iodine, Shellfish allergy, Atorvastatin, Cephalosporins, and Penicillins   Social History   Socioeconomic History   Marital status: Married    Spouse name: Not on file   Number of children: Not on file   Years of education: Not on file   Highest education level: Not on file  Occupational History   Not on file  Tobacco Use   Smoking status: Never   Smokeless tobacco: Never  Vaping Use   Vaping status: Never Used  Substance and Sexual Activity   Alcohol use: No   Drug use: No   Sexual activity: Yes  Other Topics Concern   Not on file  Social History Narrative   Not on file   Social Drivers of Health   Financial Resource Strain: Not on file  Food Insecurity: Not on file   Transportation Needs: Not on file  Physical Activity: Not on file  Stress: Not on file  Social Connections: Not on file     Family History: The patient's family history includes Breast cancer (age of onset: 6) in her sister; Diabetes in her mother; Heart attack in her brother and mother; Heart disease in her father; Stroke in her father.  ROS:   Please see the history of present illness.     All other systems reviewed and are negative.  EKGs/Labs/Other Studies Reviewed:    The following studies were reviewed today:  [place] LE Venous 01/02/2021: RIGHT:  - No evidence of deep vein thrombosis in the lower extremity. No indirect  evidence of obstruction proximal to the inguinal ligament.  - No cystic structure found in the popliteal fossa.     LEFT:  - No evidence of deep vein thrombosis in  the lower extremity. No indirect  evidence of obstruction proximal to the inguinal ligament.  - No cystic structure found in the popliteal fossa.  - Small amount of superficial edema seen at left medial ankle.   TEE 01/01/2021:  1. There is a 1.1 x 0.8 cm mass that appears adherent to the atrial  aspect of the posterior tricuspid valve leaflet. In the setting of  malignancy, this mass likely represents thrombus. . The tricuspid valve is  abnormal.   2. There is a small mobile echodensity on the PA aspect of the pulmonary  valve. This mass likely represents thrombus given findings on tricuspid  valve. . The pulmonic valve was abnormal.   3. Left ventricular ejection fraction, by estimation, is 50%. The left  ventricle has low normal function.   4. Right ventricular systolic function is normal. The right ventricular  size is normal.   5. No left atrial/left atrial appendage thrombus was detected. The LAA  emptying velocity was 56 cm/s.   6. A small pericardial effusion is present. The pericardial effusion is  in the transverse sinus. There is a small amount of epicardial fat noted  in the  transverse sinus freely mobile in pericardial fluid.   7. The mitral valve is normal in structure. Trivial mitral valve  regurgitation.   8. The aortic valve is grossly normal. There is mild calcification of the  aortic valve. Aortic valve regurgitation is not visualized. No aortic  stenosis is present.   9. Evidence of atrial level shunting detected by color flow Doppler.  There is a small patent foramen ovale with predominantly left to right  shunting across the atrial septum. Possible small fenestration in atrial  septum.   Comparison(s): Tricuspid valve mass was present on the study from 11/21/20.  On review of TTE from 07/25/20, this mass may have been present at that time  as well, given similar location of echo density, however more subtle and  image quality did not optimize.   Conclusion(s)/Recommendation(s): Critical findings reported to Dr.  Bjorn Pippin and acknowledged at 01/01/21 3:53 pm.   Echo 11/21/2020: 1. Basal inferior hypokinesis. . Left ventricular ejection fraction, by  estimation, is 50 to 55%. The left ventricle has low normal function.   2. Right ventricular systolic function is normal. The right ventricular  size is normal. There is normal pulmonary artery systolic pressure.   3. Trivial mitral valve regurgitation.   4. 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 It measures 11 x 10 mm. Review of echo from Jan 2022 (the only previous echo) the echodensity appears to be present there as well though windows are not as good. Would consider TEE to further evaluate.   5. The aortic valve is normal in structure. Aortic valve regurgitation is  not visualized.   6. The inferior vena cava is normal in size with greater than 50%  respiratory variability, suggesting right atrial pressure of 3 mmHg.  Echo 07/25/2020: 1. Left ventricular ejection fraction, by estimation, is 55 to 60%. The  left ventricle has normal function. The left ventricle  has no regional  wall motion abnormalities. Left ventricular diastolic parameters are  consistent with Grade I diastolic  dysfunction (impaired relaxation).   2. Right ventricular systolic function is normal. The right ventricular  size is normal.   3. The mitral valve is normal in structure. Trivial mitral valve  regurgitation. No evidence of mitral stenosis.   4. The aortic valve  is tricuspid. Aortic valve regurgitation is not  visualized. No aortic stenosis is present.   Conclusion(s)/Recommendation(s): Normal biventricular function without evidence of hemodynamically significant valvular heart disease.  EKG:   05/19/2023: Normal sinus rhythm, rate 71, low voltage, Q waves in leads III, aVF 05/11/22: Normal sinus rhythm, Q waves in leads III, aVF, poor R wave progression, rate 74 04/13/21: NSR, rate 82, poor R wave progression, Q waves III, aVF 01/05/2021: EKG is not ordered today. 12/23/2020: NSR, rate 89, Low voltage, Nonspecific T wave flattening  Recent Labs: 05/19/2023: Magnesium 2.2 09/01/2023: BUN 16; Creatinine, Ser 1.04; Potassium 4.3; Sodium 142  Recent Lipid Panel    Component Value Date/Time   CHOL 143 12/30/2021 1653   TRIG 77 12/30/2021 1653   HDL 56 12/30/2021 1653   CHOLHDL 2.6 12/30/2021 1653   LDLCALC 72 12/30/2021 1653      Physical Exam:    VS:  BP 126/66   Pulse 91   Ht 5\' 8"  (1.727 m)   Wt 192 lb 3.2 oz (87.2 kg)   SpO2 97%   BMI 29.22 kg/m     Wt Readings from Last 3 Encounters:  10/21/23 192 lb 3.2 oz (87.2 kg)  05/19/23 189 lb 6.4 oz (85.9 kg)  03/01/23 190 lb 14.4 oz (86.6 kg)     GEN: Well nourished, well developed in no acute distress HEENT: Normal NECK: No JVD; No carotid bruits LYMPHATICS: No lymphadenopathy CARDIAC: RRR, no murmurs, rubs, gallops RESPIRATORY:  Clear to auscultation without rales, wheezing or rhonchi  ABDOMEN: Soft, non-tender, non-distended MUSCULOSKELETAL:  Trace LE edema; No deformity  SKIN: Warm and  dry NEUROLOGIC:  Alert and oriented x 3 PSYCHIATRIC:  Normal affect   ASSESSMENT:    1. Heart failure with improved ejection fraction (HFimpEF) (HCC)   2. Tricuspid valve mass   3. Hyperlipidemia, unspecified hyperlipidemia type   4. Medication management        PLAN:    Tricuspid valve fibroelastoma: Echocardiogram on 11/21/2020 showed echodensity on the tricuspid valve measuring 1 cm, mild TR.  Transesophageal echocardiogram on 01/01/2021 showed 1.1 x 0.8 cm mass adherent to the atrial aspect of the posterior tricuspid valve leaflet, likely representing thrombus.  Also with small mobile echodensity on the pulmonary artery aspect of the pulmonary valve, likely representing thrombus.  EF 50%, normal RV function, small pericardial effusion, small PFO.  Lower extremity duplex on 01/02/2021 was negative for DVT  Echocardiogram 04/07/2021 showed EF 35 to 40%, global hypokinesis, and grade 1 diastolic dysfunction, normal RV function, mild MR, unchanged tricuspid valve mass.  Stress MRI on 04/18/2021 showed mobile mass attached to septal leaflet of tricuspid valve consistent with papillary fibroelatoma -Blood cultures negative when mass initially seen.  Initially suspected thrombus, completed 3 months of anticoagulation with repeat echo showing unchanged mass.  Cardiac MRI confirms fibroelastoma -Given right-sided fibroelastoma and considering age and no history of embolic events, do not recommend surgery -Discussed continuing Eliquis, which may have some benefit given fibroelastoma but discussed that there is little data on this.  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  Her preference was to stop the Eliquis and I think this is reasonable.  Switched to aspirin 81 mg daily  Heart failure with improved ejection fraction: Echocardiogram 04/07/2021 showed EF 35 to 40%, global hypokinesis, and grade 1  diastolic dysfunction, normal RV function, mild MR, unchanged tricuspid valve mass.  Stress  MRI on 04/18/2021 showed no evidence of ischemia on stress perfusion, no LGE, LVEF 43%, RVEF 53%.  Echo 05/25/2021 showed EF 40%, normal RV function, mild to moderate left atrial dilatation, mild right atrial dilatation, 1 cm x 0.9 cm tricuspid valve mass.  Echocardiogram 04/21/2022 showed EF 50 to 55%, normal RV function, stable tricuspid valve mass, no significant valvular disease.  Echocardiogram 10/20/2023 showed EF 60 to 65%, normal RV function, stable tricuspid valve mass. -Continue Toprol-XL 25 mg daily -Continue losartan 25 mg daily.  Previously discussed switching to [facility] but she would prefer to hold off -Continue spironolactone 12.5 mg daily -Continue Farxiga 10 mg every other day -Check BMET, magnesium  Breast cancer: Underwent lumpectomy on 05/12/2021.    Hyperlipidemia: LDL 72 on 12/30/2021, on rosuvastatin 10 mg daily.   Calcium score 150 on 01/08/2022 (55th percentile).  LDL 85 on 05/06/2023, discussed increasing rosuvastatin but she declined, requested to work on dietary changes.  Will update lipid panel  RTC in 6 months   Medication Adjustments/Labs and Tests Ordered: Current medicines are reviewed at length with the patient today.  Concerns regarding medicines are outlined above.  Orders Placed This Encounter  Procedures   Lipid panel   Basic Metabolic Panel (BMET)      No orders of the defined types were placed in this encounter.     Patient Instructions  Medication Instructions:  No Changes  Lab Work: FASTING Lipid panel and BMET, to do any day in the next 30 days.  Follow-Up: At [facility] you and your health needs are our priority.  As part of our continuing mission to provide you with exceptional heart care, our providers are all part of one team.  This team includes your primary Cardiologist (physician) and Advanced Practice Providers or APPs (Physician  Assistants and Nurse Practitioners) who all work together to provide you with the care you need, when you need it.  Your next appointment:   6 month(s) (We will mail a reminder letter around July 2025, please call us to make an appointment for October 2025)  Provider:   Little Ishikawa, MD          Valet parking services will be available as well.       Signed, Little Ishikawa, MD  10/21/2023 2:40 PM    [facility]

## 2023-10-20 ENCOUNTER — Ambulatory Visit (HOSPITAL_COMMUNITY): Payer: Medicare Other | Attending: Cardiology

## 2023-10-20 DIAGNOSIS — I361 Nonrheumatic tricuspid (valve) insufficiency: Secondary | ICD-10-CM

## 2023-10-20 DIAGNOSIS — I5032 Chronic diastolic (congestive) heart failure: Secondary | ICD-10-CM

## 2023-10-20 LAB — ECHOCARDIOGRAM COMPLETE
Area-P 1/2: 4.1 cm2
S' Lateral: 3.2 cm

## 2023-10-21 ENCOUNTER — Encounter: Payer: Self-pay | Admitting: Cardiology

## 2023-10-21 ENCOUNTER — Ambulatory Visit: Payer: Medicare Other | Attending: Cardiology | Admitting: Cardiology

## 2023-10-21 VITALS — BP 126/66 | HR 91 | Ht 68.0 in | Wt 192.2 lb

## 2023-10-21 DIAGNOSIS — E785 Hyperlipidemia, unspecified: Secondary | ICD-10-CM | POA: Diagnosis not present

## 2023-10-21 DIAGNOSIS — I5032 Chronic diastolic (congestive) heart failure: Secondary | ICD-10-CM | POA: Diagnosis not present

## 2023-10-21 DIAGNOSIS — I078 Other rheumatic tricuspid valve diseases: Secondary | ICD-10-CM

## 2023-10-21 DIAGNOSIS — Z79899 Other long term (current) drug therapy: Secondary | ICD-10-CM | POA: Diagnosis not present

## 2023-10-21 NOTE — Patient Instructions (Signed)
 Medication Instructions:  No Changes  Lab Work: FASTING Lipid panel and BMET, to do any day in the next 30 days.  Follow-Up: At [facility], you and your health needs are our priority.  As part of our continuing mission to provide you with exceptional heart care, our providers are all part of one team.  This team includes your primary Cardiologist (physician) and Advanced Practice Providers or APPs (Physician Assistants and Nurse Practitioners) who all work together to provide you with the care you need, when you need it.  Your next appointment:   6 month(s) (We will mail a reminder letter around July 2025, please call us to make an appointment for October 2025)  Provider:   Little Ishikawa, MD          Valet parking services will be available as well.

## 2023-11-18 DIAGNOSIS — Z79899 Other long term (current) drug therapy: Secondary | ICD-10-CM | POA: Diagnosis not present

## 2023-11-18 DIAGNOSIS — E785 Hyperlipidemia, unspecified: Secondary | ICD-10-CM | POA: Diagnosis not present

## 2023-11-19 LAB — BASIC METABOLIC PANEL WITH GFR
BUN/Creatinine Ratio: 15 (ref 12–28)
BUN: 14 mg/dL (ref 8–27)
CO2: 22 mmol/L (ref 20–29)
Calcium: 9.8 mg/dL (ref 8.7–10.3)
Chloride: 103 mmol/L (ref 96–106)
Creatinine, Ser: 0.96 mg/dL (ref 0.57–1.00)
Glucose: 104 mg/dL — ABNORMAL HIGH (ref 70–99)
Potassium: 4.3 mmol/L (ref 3.5–5.2)
Sodium: 140 mmol/L (ref 134–144)
eGFR: 59 mL/min/{1.73_m2} — ABNORMAL LOW (ref >59)

## 2023-11-19 LAB — LIPID PANEL
Chol/HDL Ratio: 2.9 ratio (ref 0.0–4.4)
Cholesterol, Total: 159 mg/dL (ref 100–199)
HDL: 55 mg/dL (ref >39)
LDL Chol Calc (NIH): 85 mg/dL (ref 0–99)
Triglycerides: 103 mg/dL (ref 0–149)
VLDL Cholesterol Cal: 19 mg/dL (ref 5–40)

## 2023-11-23 ENCOUNTER — Encounter: Payer: Self-pay | Admitting: *Deleted

## 2023-11-25 ENCOUNTER — Other Ambulatory Visit: Payer: Self-pay | Admitting: Hematology and Oncology

## 2023-11-25 DIAGNOSIS — C50511 Malignant neoplasm of lower-outer quadrant of right female breast: Secondary | ICD-10-CM

## 2024-01-06 DIAGNOSIS — I700 Atherosclerosis of aorta: Secondary | ICD-10-CM | POA: Diagnosis not present

## 2024-01-06 DIAGNOSIS — I5042 Chronic combined systolic (congestive) and diastolic (congestive) heart failure: Secondary | ICD-10-CM | POA: Diagnosis not present

## 2024-01-06 DIAGNOSIS — Z23 Encounter for immunization: Secondary | ICD-10-CM | POA: Diagnosis not present

## 2024-01-06 DIAGNOSIS — R7303 Prediabetes: Secondary | ICD-10-CM | POA: Diagnosis not present

## 2024-01-06 DIAGNOSIS — I078 Other rheumatic tricuspid valve diseases: Secondary | ICD-10-CM | POA: Diagnosis not present

## 2024-01-31 DIAGNOSIS — J029 Acute pharyngitis, unspecified: Secondary | ICD-10-CM | POA: Diagnosis not present

## 2024-01-31 DIAGNOSIS — B349 Viral infection, unspecified: Secondary | ICD-10-CM | POA: Diagnosis not present

## 2024-01-31 DIAGNOSIS — U071 COVID-19: Secondary | ICD-10-CM | POA: Diagnosis not present

## 2024-02-28 NOTE — Assessment & Plan Note (Signed)
 07/14/2020:Screening mammogram showed a 1.5cm right breast mass. US  showed the 1.4cm mass at the 7 o'clock position, a 2.7cm mass at the 9 o'clock position, and benign right axillary lymph nodes. Biopsy showed ALH at the 9 o'clock position, and at the 7 o'clock position, IDC, grade 3, HER-2 positive (3+), ER+ 20% weak, PR- 0%, Ki67 40%.   Treatment Plan: 1. Neoadjuvant chemotherapy with Taxol  Herceptin  followed by Herceptin  maintenance for 1 year completed 03/25/2021.  Stopped for low EF 2. 05/12/2021: Pathologic complete response  Right lumpectomy: LCIS involving CSL, intraductal papilloma negative for IDC or DCIS Right lateral margin lumpectomy: LCIS, ADH Lymph nodes: 3 negative 3. Followed by adjuvant radiation therapy  4.  Followed by adjuvant antiestrogen therapy with anastrozole  started 08/27/2021 discontinued 03/01/2023 (muscle aches and pains especially right shoulder) -------------------------------------------------------------------------------------------------------------- Current treatment: anastrozole  therapy.  Breast Cancer Surveillance: Breast Exam: 02/29/24: Benign Mammograms 10/07/23: Benign density cat C Return to clinic next year for follow-up.

## 2024-02-29 ENCOUNTER — Inpatient Hospital Stay: Payer: Medicare Other | Attending: Hematology and Oncology | Admitting: Hematology and Oncology

## 2024-02-29 VITALS — BP 122/74 | HR 75 | Temp 97.7°F | Resp 18 | Ht 68.0 in | Wt 188.9 lb

## 2024-02-29 DIAGNOSIS — Z79899 Other long term (current) drug therapy: Secondary | ICD-10-CM | POA: Diagnosis not present

## 2024-02-29 DIAGNOSIS — C50511 Malignant neoplasm of lower-outer quadrant of right female breast: Secondary | ICD-10-CM | POA: Diagnosis not present

## 2024-02-29 DIAGNOSIS — Z7982 Long term (current) use of aspirin: Secondary | ICD-10-CM | POA: Insufficient documentation

## 2024-02-29 DIAGNOSIS — Z780 Asymptomatic menopausal state: Secondary | ICD-10-CM | POA: Diagnosis not present

## 2024-02-29 DIAGNOSIS — Z170 Estrogen receptor positive status [ER+]: Secondary | ICD-10-CM | POA: Diagnosis not present

## 2024-02-29 DIAGNOSIS — Z881 Allergy status to other antibiotic agents status: Secondary | ICD-10-CM | POA: Insufficient documentation

## 2024-02-29 DIAGNOSIS — Z79811 Long term (current) use of aromatase inhibitors: Secondary | ICD-10-CM | POA: Diagnosis not present

## 2024-02-29 DIAGNOSIS — R232 Flushing: Secondary | ICD-10-CM | POA: Insufficient documentation

## 2024-02-29 DIAGNOSIS — Z888 Allergy status to other drugs, medicaments and biological substances status: Secondary | ICD-10-CM | POA: Diagnosis not present

## 2024-02-29 DIAGNOSIS — Z880 Allergy status to penicillin: Secondary | ICD-10-CM | POA: Diagnosis not present

## 2024-02-29 DIAGNOSIS — N6315 Unspecified lump in the right breast, overlapping quadrants: Secondary | ICD-10-CM | POA: Insufficient documentation

## 2024-02-29 NOTE — Progress Notes (Signed)
 Patient Care Team: Vernon Velna SAUNDERS, MD as PCP - General (Internal Medicine) Kate Lonni CROME, MD as PCP - Cardiology (Cardiology) Ebbie Cough, MD as Consulting Physician (General Surgery) Odean Potts, MD as Consulting Physician (Hematology and Oncology) Dewey Rush, MD as Consulting Physician (Radiation Oncology) Kate Lonni CROME, MD as Consulting Physician (Cardiology)  DIAGNOSIS:  Encounter Diagnosis  Name Primary?   Malignant neoplasm of lower-outer quadrant of right breast of female, estrogen receptor positive (HCC) Yes    SUMMARY OF ONCOLOGIC HISTORY: Oncology History  Malignant neoplasm of lower-outer quadrant of right breast of female, estrogen receptor positive (HCC)  07/14/2020 Initial Diagnosis   Screening mammogram showed a 1.5cm right breast mass. US  showed the 1.4cm mass at the 7 o'clock position, a 2.7cm mass at the 9 o'clock position, and benign right axillary lymph nodes. Biopsy showed ALH at the 9 o'clock position, and at the 7 o'clock position, IDC, grade 3, HER-2 positive (3+), ER+ 20% weak, PR- 0%, Ki67 40%.   09/03/2020 - 11/19/2020 Neo-Adjuvant Chemotherapy   Weekly Taxol  (dose reduced) and Herceptin  x 12   12/10/2020 - 03/25/2021 Chemotherapy   Patient is on Treatment Plan : BREAST Trastuzumab  q21d      05/12/2021 Surgery   Right lumpectomy: LCIS involving CSL, intraductal papilloma negative for IDC or DCIS Right lateral margin lumpectomy: LCIS, ADH Lymph nodes: 3 negative    08/2021 -  Anti-estrogen oral therapy   Anastrozole  daily     CHIEF COMPLIANT: Surveillance of breast cancer on anastrozole  therapy  HISTORY OF PRESENT ILLNESS:  History of Present Illness Claudia Ortiz is an 83 year old female with breast cancer on anastrozole  therapy who presents for routine follow-up.  She has been on anastrozole  since February 2023, experiencing manageable hot flashes as the only significant side effect. Her mammograms in March 2025  were normal. She is due for a bone density scan this year, with the last scan in 2023 showing optimal results. She experiences some nerve discomfort, which she describes as manageable.     ALLERGIES:  is allergic to iodine, shellfish allergy, atorvastatin, cephalosporins, and penicillins.  MEDICATIONS:  Current Outpatient Medications  Medication Sig Dispense Refill   anastrozole  (ARIMIDEX ) 1 MG tablet Take 1 tablet by mouth once daily 90 tablet 2   Apoaequorin (PREVAGEN) 10 MG CAPS Take 10 mg by mouth daily.     aspirin  EC 81 MG tablet Take 1 tablet (81 mg total) by mouth daily. Swallow whole. 90 tablet 3   b complex vitamins capsule Take 1 capsule by mouth daily.     Bioflavonoid Products (SUPER C-500 COMPLEX PO) Take 1 tablet by mouth daily.     cholecalciferol (VITAMIN D3) 25 MCG (1000 UNIT) tablet Take 1,000 Units by mouth daily.     dapagliflozin  propanediol (FARXIGA ) 10 MG TABS tablet Take 1 tablet (10 mg total) by mouth every other day. 45 tablet 3   Evening Primrose Oil 500 MG CAPS Take 500 mg by mouth daily.     Garlic 1000 MG CAPS Take 1,000 mg by mouth daily.     losartan  (COZAAR ) 25 MG tablet Take 1 tablet (25 mg total) by mouth daily. 90 tablet 3   metoprolol  succinate (TOPROL -XL) 25 MG 24 hr tablet Take 1 tablet (25 mg total) by mouth daily. 90 tablet 3   Multiple Vitamin (MULTIVITAMIN) tablet Take 1 tablet by mouth daily.     rosuvastatin  (CRESTOR ) 10 MG tablet TAKE 1 TABLET BY MOUTH EVERY OTHER DAY 45 tablet  2   spironolactone  (ALDACTONE ) 25 MG tablet Take 0.5 tablets (12.5 mg total) by mouth daily. 45 tablet 2   Vitamin A 7.5 MG (25000 UT) CAPS Take 25,000 Units by mouth daily.     No current facility-administered medications for this visit.    PHYSICAL EXAMINATION: ECOG PERFORMANCE STATUS: 1 - Symptomatic but completely ambulatory  Vitals:   02/29/24 0945  BP: 122/74  Pulse: 75  Resp: 18  Temp: 97.7 F (36.5 C)  SpO2: 100%   Filed Weights   02/29/24 0945   Weight: 188 lb 14.4 oz (85.7 kg)    Physical Exam BREAST: Breasts with good scar tissue, no abnormalities.  (exam performed in the presence of a chaperone)  LABORATORY DATA:  I have reviewed the data as listed    Latest Ref Rng & Units 11/18/2023    2:01 PM 09/01/2023    1:56 PM 05/19/2023    3:18 PM  CMP  Glucose 70 - 99 mg/dL 895  95  89   BUN 8 - 27 mg/dL 14  16  16    Creatinine 0.57 - 1.00 mg/dL 9.03  8.95  8.98   Sodium 134 - 144 mmol/L 140  142  144   Potassium 3.5 - 5.2 mmol/L 4.3  4.3  4.6   Chloride 96 - 106 mmol/L 103  105  106   CO2 20 - 29 mmol/L 22  22  21    Calcium  8.7 - 10.3 mg/dL 9.8  9.8  89.9     Lab Results  Component Value Date   WBC 5.9 05/27/2021   HGB 12.1 05/27/2021   HCT 37.3 05/27/2021   MCV 81.1 05/27/2021   PLT 208 05/27/2021   NEUTROABS 3.2 05/27/2021    ASSESSMENT & PLAN:  Malignant neoplasm of lower-outer quadrant of right breast of female, estrogen receptor positive (HCC) 07/14/2020:Screening mammogram showed a 1.5cm right breast mass. US  showed the 1.4cm mass at the 7 o'clock position, a 2.7cm mass at the 9 o'clock position, and benign right axillary lymph nodes. Biopsy showed ALH at the 9 o'clock position, and at the 7 o'clock position, IDC, grade 3, HER-2 positive (3+), ER+ 20% weak, PR- 0%, Ki67 40%.   Treatment Plan: 1. Neoadjuvant chemotherapy with Taxol  Herceptin  followed by Herceptin  maintenance for 1 year completed 03/25/2021.  Stopped for low EF 2. 05/12/2021: Pathologic complete response  Right lumpectomy: LCIS involving CSL, intraductal papilloma negative for IDC or DCIS Right lateral margin lumpectomy: LCIS, ADH Lymph nodes: 3 negative 3. Followed by adjuvant radiation therapy  4.  Followed by adjuvant antiestrogen therapy with anastrozole  started 08/27/2021 -------------------------------------------------------------------------------------------------------------- Current treatment: anastrozole  therapy.  (Plan to treat for 5  years) Anastrozole  toxicities: Mild hot flashes Breast Cancer Surveillance: Breast Exam: 02/29/24: Benign Mammograms 10/07/23: Benign density cat C Return to clinic next year for follow-up.    No orders of the defined types were placed in this encounter.  The patient has a good understanding of the overall plan. she agrees with it. she will call with any problems that may develop before the next visit here. Total time spent: 30 mins including face to face time and time spent for planning, charting and co-ordination of care   Claudia MARLA Chad, MD 02/29/24

## 2024-04-12 ENCOUNTER — Other Ambulatory Visit: Payer: Self-pay | Admitting: Cardiology

## 2024-05-08 ENCOUNTER — Emergency Department (HOSPITAL_COMMUNITY)

## 2024-05-08 ENCOUNTER — Encounter (HOSPITAL_COMMUNITY): Payer: Self-pay

## 2024-05-08 ENCOUNTER — Emergency Department (HOSPITAL_COMMUNITY): Admission: EM | Admit: 2024-05-08 | Discharge: 2024-05-08 | Disposition: A | Discharge status: Home / Self Care

## 2024-05-08 ENCOUNTER — Other Ambulatory Visit: Payer: Self-pay

## 2024-05-08 DIAGNOSIS — R911 Solitary pulmonary nodule: Secondary | ICD-10-CM | POA: Diagnosis not present

## 2024-05-08 DIAGNOSIS — S12690A Other displaced fracture of seventh cervical vertebra, initial encounter for closed fracture: Secondary | ICD-10-CM

## 2024-05-08 DIAGNOSIS — Z7982 Long term (current) use of aspirin: Secondary | ICD-10-CM | POA: Insufficient documentation

## 2024-05-08 DIAGNOSIS — R0789 Other chest pain: Secondary | ICD-10-CM | POA: Diagnosis not present

## 2024-05-08 DIAGNOSIS — D72829 Elevated white blood cell count, unspecified: Secondary | ICD-10-CM | POA: Diagnosis not present

## 2024-05-08 DIAGNOSIS — K8020 Calculus of gallbladder without cholecystitis without obstruction: Secondary | ICD-10-CM | POA: Diagnosis not present

## 2024-05-08 DIAGNOSIS — S199XXA Unspecified injury of neck, initial encounter: Secondary | ICD-10-CM | POA: Diagnosis present

## 2024-05-08 DIAGNOSIS — R930 Abnormal findings on diagnostic imaging of skull and head, not elsewhere classified: Secondary | ICD-10-CM | POA: Diagnosis not present

## 2024-05-08 DIAGNOSIS — S3991XA Unspecified injury of abdomen, initial encounter: Secondary | ICD-10-CM | POA: Diagnosis not present

## 2024-05-08 DIAGNOSIS — S12600A Unspecified displaced fracture of seventh cervical vertebra, initial encounter for closed fracture: Secondary | ICD-10-CM | POA: Diagnosis not present

## 2024-05-08 DIAGNOSIS — Z853 Personal history of malignant neoplasm of breast: Secondary | ICD-10-CM | POA: Diagnosis not present

## 2024-05-08 DIAGNOSIS — Y92410 Unspecified street and highway as the place of occurrence of the external cause: Secondary | ICD-10-CM | POA: Insufficient documentation

## 2024-05-08 DIAGNOSIS — S0990XA Unspecified injury of head, initial encounter: Secondary | ICD-10-CM | POA: Diagnosis not present

## 2024-05-08 DIAGNOSIS — I6782 Cerebral ischemia: Secondary | ICD-10-CM | POA: Diagnosis not present

## 2024-05-08 DIAGNOSIS — S299XXA Unspecified injury of thorax, initial encounter: Secondary | ICD-10-CM | POA: Diagnosis not present

## 2024-05-08 DIAGNOSIS — I509 Heart failure, unspecified: Secondary | ICD-10-CM | POA: Diagnosis not present

## 2024-05-08 DIAGNOSIS — S3993XA Unspecified injury of pelvis, initial encounter: Secondary | ICD-10-CM | POA: Diagnosis not present

## 2024-05-08 DIAGNOSIS — R079 Chest pain, unspecified: Secondary | ICD-10-CM | POA: Diagnosis not present

## 2024-05-08 LAB — CBC
HCT: 44 % (ref 36.0–46.0)
Hemoglobin: 13.9 g/dL (ref 12.0–15.0)
MCH: 27.1 pg (ref 26.0–34.0)
MCHC: 31.6 g/dL (ref 30.0–36.0)
MCV: 85.9 fL (ref 80.0–100.0)
Platelets: 203 K/uL (ref 150–400)
RBC: 5.12 MIL/uL — ABNORMAL HIGH (ref 3.87–5.11)
RDW: 13.8 % (ref 11.5–15.5)
WBC: 10.6 K/uL — ABNORMAL HIGH (ref 4.0–10.5)
nRBC: 0 % (ref 0.0–0.2)

## 2024-05-08 LAB — I-STAT CHEM 8, ED
BUN: 16 mg/dL (ref 8–23)
Calcium, Ion: 1.24 mmol/L (ref 1.15–1.40)
Chloride: 105 mmol/L (ref 98–111)
Creatinine, Ser: 0.9 mg/dL (ref 0.44–1.00)
Glucose, Bld: 94 mg/dL (ref 70–99)
HCT: 43 % (ref 36.0–46.0)
Hemoglobin: 14.6 g/dL (ref 12.0–15.0)
Potassium: 4 mmol/L (ref 3.5–5.1)
Sodium: 142 mmol/L (ref 135–145)
TCO2: 25 mmol/L (ref 22–32)

## 2024-05-08 LAB — COMPREHENSIVE METABOLIC PANEL WITH GFR
ALT: 23 U/L (ref 0–44)
AST: 34 U/L (ref 15–41)
Albumin: 4 g/dL (ref 3.5–5.0)
Alkaline Phosphatase: 94 U/L (ref 38–126)
Anion gap: 11 (ref 5–15)
BUN: 14 mg/dL (ref 8–23)
CO2: 23 mmol/L (ref 22–32)
Calcium: 9.8 mg/dL (ref 8.9–10.3)
Chloride: 106 mmol/L (ref 98–111)
Creatinine, Ser: 0.95 mg/dL (ref 0.44–1.00)
GFR, Estimated: 59 mL/min — ABNORMAL LOW (ref >60)
Glucose, Bld: 96 mg/dL (ref 70–99)
Potassium: 3.8 mmol/L (ref 3.5–5.1)
Sodium: 140 mmol/L (ref 135–145)
Total Bilirubin: 0.8 mg/dL (ref 0.0–1.2)
Total Protein: 7.2 g/dL (ref 6.5–8.1)

## 2024-05-08 LAB — TYPE AND SCREEN
ABO/RH(D): O POS
Antibody Screen: NEGATIVE

## 2024-05-08 LAB — PROTIME-INR
INR: 1 (ref 0.8–1.2)
Prothrombin Time: 14.3 s (ref 11.4–15.2)

## 2024-05-08 LAB — ABO/RH: ABO/RH(D): O POS

## 2024-05-08 MED ORDER — LIDOCAINE 5 % EX PTCH: 1.0000 | MEDICATED_PATCH | CUTANEOUS | 0 refills | Status: AC

## 2024-05-08 MED ORDER — METHOCARBAMOL 500 MG PO TABS: 500.0000 mg | ORAL_TABLET | Freq: Three times a day (TID) | ORAL | 0 refills | Status: AC | PRN

## 2024-05-08 MED ORDER — OXYCODONE HCL 5 MG PO TABS
5.0000 mg | ORAL_TABLET | Freq: Four times a day (QID) | ORAL | Status: DC | PRN
  Administered 2024-05-08: 5 mg via ORAL
  Filled 2024-05-08: qty 1

## 2024-05-08 NOTE — Discharge Instructions (Addendum)
 You have a small fracture in the part of your cervical spine at C7.  Primary treatment is pain control and you can wear cervical collar as needed for comfort.  Use ice or heat over area of pain.  Use lidocaine  patch over area of pain. Take Robaxin as needed but this may cause some sedation so do not drive or operate heavy machinery when taking it.  Please call to schedule appointment for follow-up and return to ER with new or worsening symptoms.  You have a small pulmonary nodule please follow-up with repeat CT scan of chest in 1 year.
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
## 2024-05-08 NOTE — ED Notes (Signed)
 [place] J applied to pt
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
## 2024-05-14 DIAGNOSIS — I700 Atherosclerosis of aorta: Secondary | ICD-10-CM | POA: Diagnosis not present

## 2024-05-14 DIAGNOSIS — I5042 Chronic combined systolic (congestive) and diastolic (congestive) heart failure: Secondary | ICD-10-CM | POA: Diagnosis not present

## 2024-05-14 DIAGNOSIS — R7303 Prediabetes: Secondary | ICD-10-CM | POA: Diagnosis not present

## 2024-05-14 DIAGNOSIS — C50919 Malignant neoplasm of unspecified site of unspecified female breast: Secondary | ICD-10-CM | POA: Diagnosis not present

## 2024-05-14 DIAGNOSIS — E7800 Pure hypercholesterolemia, unspecified: Secondary | ICD-10-CM | POA: Diagnosis not present

## 2024-05-17 DIAGNOSIS — S12691A Other nondisplaced fracture of seventh cervical vertebra, initial encounter for closed fracture: Secondary | ICD-10-CM | POA: Diagnosis not present

## 2024-06-06 ENCOUNTER — Other Ambulatory Visit: Payer: Self-pay | Admitting: Cardiology

## 2024-06-11 ENCOUNTER — Other Ambulatory Visit: Payer: Self-pay | Admitting: Cardiology

## 2024-06-19 ENCOUNTER — Other Ambulatory Visit: Payer: Self-pay | Admitting: Cardiology

## 2024-06-25 ENCOUNTER — Telehealth: Payer: Self-pay | Admitting: Cardiology

## 2024-06-25 NOTE — Telephone Encounter (Signed)
 Returned call to pt.  She is aware that we sent in a year in 08/2023 and she should have refills at her pharmacy. Pt thanked me for calling.

## 2024-06-25 NOTE — Telephone Encounter (Signed)
*  STAT* If patient is at the pharmacy, call can be transferred to refill team.   1. Which medications need to be refilled? (please list name of each medication and dose if known)   dapagliflozin  propanediol (FARXIGA ) 10 MG TABS tablet    2. Would you like to learn more about the convenience, safety, & potential cost savings by using the [facility]? no   3. Are you open to using the [facility] (Type [facility]. no   4. Which pharmacy/location (including street and city if local pharmacy) is medication to be sent to?  [id number] - [place], [place] - [address]     5. Do they need a 30 day or 90 day supply? 90 day
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
## 2024-07-03 ENCOUNTER — Encounter: Payer: Self-pay | Admitting: Cardiology

## 2024-07-03 ENCOUNTER — Ambulatory Visit: Attending: Cardiology | Admitting: Cardiology

## 2024-07-03 VITALS — BP 114/72 | HR 68 | Ht 68.0 in | Wt 184.0 lb

## 2024-07-03 DIAGNOSIS — E785 Hyperlipidemia, unspecified: Secondary | ICD-10-CM

## 2024-07-03 DIAGNOSIS — Z79899 Other long term (current) drug therapy: Secondary | ICD-10-CM

## 2024-07-03 DIAGNOSIS — I078 Other rheumatic tricuspid valve diseases: Secondary | ICD-10-CM

## 2024-07-03 DIAGNOSIS — I5020 Unspecified systolic (congestive) heart failure: Secondary | ICD-10-CM

## 2024-07-03 DIAGNOSIS — I368 Other nonrheumatic tricuspid valve disorders: Secondary | ICD-10-CM

## 2024-07-03 LAB — BASIC METABOLIC PANEL WITH GFR
BUN/Creatinine Ratio: 16 (ref 12–28)
BUN: 15 mg/dL (ref 8–27)
CO2: 25 mmol/L (ref 20–29)
Calcium: 9.9 mg/dL (ref 8.7–10.3)
Chloride: 103 mmol/L (ref 96–106)
Creatinine, Ser: 0.91 mg/dL (ref 0.57–1.00)
Glucose: 114 mg/dL — ABNORMAL HIGH (ref 70–99)
Potassium: 4.1 mmol/L (ref 3.5–5.2)
Sodium: 143 mmol/L (ref 134–144)
eGFR: 62 mL/min/1.73 (ref >59)

## 2024-07-03 LAB — MAGNESIUM: Magnesium: 2 mg/dL (ref 1.6–2.3)

## 2024-07-03 NOTE — Patient Instructions (Signed)
 Medication Instructions:  Your physician recommends that you continue on your current medications as directed. Please refer to the Current Medication list given to you today.  *If you need a refill on your cardiac medications before your next appointment, please call your pharmacy*  Lab Work: BMET and Mag today at [facility] If you have labs (blood work) drawn today and your tests are completely normal, you will receive your results only by: MyChart Message (if you have MyChart) OR A paper copy in the mail If you have any lab test that is abnormal or we need to change your treatment, we will call you to review the results.  Testing/Procedures: NONE  Follow-Up: At [facility], you and your health needs are our priority.  As part of our continuing mission to provide you with exceptional heart care, our providers are all part of one team.  This team includes your primary Cardiologist (physician) and Advanced Practice Providers or APPs (Physician Assistants and Nurse Practitioners) who all work together to provide you with the care you need, when you need it.  Your next appointment:   6 month(s)  Provider:   Lonni LITTIE Nanas, MD    We recommend signing up for the patient portal called MyChart.  Sign up information is provided on this After Visit Summary.  MyChart is used to connect with patients for Virtual Visits (Telemedicine).  Patients are able to view lab/test results, encounter notes, upcoming appointments, etc.  Non-urgent messages can be sent to your provider as well.   To learn more about what you can do with MyChart, go to [email or web address].

## 2024-07-04 ENCOUNTER — Ambulatory Visit: Payer: Self-pay | Admitting: Cardiology

## 2024-11-20 ENCOUNTER — Other Ambulatory Visit (HOSPITAL_BASED_OUTPATIENT_CLINIC_OR_DEPARTMENT_OTHER)

## 2025-02-28 ENCOUNTER — Inpatient Hospital Stay: Admitting: Hematology and Oncology
